# Patient Record
Sex: Female | Born: 1965
Health system: Southern US, Community
[De-identification: ages and names within clinical notes are randomized; demographics above are authoritative.]

## PROBLEM LIST (undated history)

## (undated) DIAGNOSIS — F419 Anxiety disorder, unspecified: Secondary | ICD-10-CM

## (undated) DIAGNOSIS — F329 Major depressive disorder, single episode, unspecified: Secondary | ICD-10-CM

## (undated) DIAGNOSIS — F32A Depression, unspecified: Secondary | ICD-10-CM

## (undated) DIAGNOSIS — S83209A Unspecified tear of unspecified meniscus, current injury, unspecified knee, initial encounter: Secondary | ICD-10-CM

## (undated) DIAGNOSIS — M179 Osteoarthritis of knee, unspecified: Secondary | ICD-10-CM

## (undated) DIAGNOSIS — K219 Gastro-esophageal reflux disease without esophagitis: Secondary | ICD-10-CM

## (undated) DIAGNOSIS — M1710 Unilateral primary osteoarthritis, unspecified knee: Secondary | ICD-10-CM

## (undated) DIAGNOSIS — E039 Hypothyroidism, unspecified: Secondary | ICD-10-CM

## (undated) DIAGNOSIS — M1990 Unspecified osteoarthritis, unspecified site: Secondary | ICD-10-CM

## (undated) HISTORY — DX: Gastro-esophageal reflux disease without esophagitis: K21.9

## (undated) HISTORY — DX: Osteoarthritis of knee, unspecified: M17.9

## (undated) HISTORY — DX: Depression, unspecified: F32.A

## (undated) HISTORY — PX: TONSILLECTOMY: SUR1361

## (undated) HISTORY — DX: Major depressive disorder, single episode, unspecified: F32.9

## (undated) HISTORY — DX: Unspecified tear of unspecified meniscus, current injury, unspecified knee, initial encounter: S83.209A

## (undated) HISTORY — DX: Unspecified osteoarthritis, unspecified site: M19.90

## (undated) HISTORY — DX: Anxiety disorder, unspecified: F41.9

## (undated) HISTORY — PX: CHOLECYSTECTOMY: SHX55

## (undated) HISTORY — DX: Unilateral primary osteoarthritis, unspecified knee: M17.10

## (undated) HISTORY — PX: FOOT SURGERY: SHX648

---

## 2001-08-27 ENCOUNTER — Emergency Department (HOSPITAL_COMMUNITY): Admission: EM | Admit: 2001-08-27 | Discharge: 2001-08-28 | Payer: Self-pay | Admitting: *Deleted

## 2005-11-16 ENCOUNTER — Emergency Department (HOSPITAL_COMMUNITY): Admission: EM | Admit: 2005-11-16 | Discharge: 2005-11-16 | Payer: Self-pay | Admitting: Family Medicine

## 2006-01-18 ENCOUNTER — Emergency Department (HOSPITAL_COMMUNITY): Admission: EM | Admit: 2006-01-18 | Discharge: 2006-01-18 | Payer: Self-pay | Admitting: Emergency Medicine

## 2010-01-17 ENCOUNTER — Emergency Department (HOSPITAL_COMMUNITY): Admission: EM | Admit: 2010-01-17 | Discharge: 2010-01-17 | Payer: Self-pay | Admitting: Emergency Medicine

## 2010-01-17 ENCOUNTER — Emergency Department (HOSPITAL_COMMUNITY): Admission: EM | Admit: 2010-01-17 | Discharge: 2010-01-18 | Payer: Self-pay | Admitting: Emergency Medicine

## 2010-07-16 LAB — URINALYSIS, ROUTINE W REFLEX MICROSCOPIC
Glucose, UA: NEGATIVE mg/dL
Ketones, ur: NEGATIVE mg/dL
Protein, ur: NEGATIVE mg/dL
Specific Gravity, Urine: 1.026 (ref 1.005–1.030)
Urobilinogen, UA: 0.2 mg/dL (ref 0.0–1.0)
Urobilinogen, UA: 0.2 mg/dL (ref 0.0–1.0)

## 2010-07-16 LAB — CBC
MCH: 30.4 pg (ref 26.0–34.0)
MCHC: 33.1 g/dL (ref 30.0–36.0)
MCV: 92 fL (ref 78.0–100.0)
Platelets: 227 10*3/uL (ref 150–400)
RDW: 13.9 % (ref 11.5–15.5)
WBC: 9.5 10*3/uL (ref 4.0–10.5)

## 2010-07-16 LAB — URINE MICROSCOPIC-ADD ON

## 2010-07-16 LAB — POCT URINALYSIS DIPSTICK
Protein, ur: NEGATIVE mg/dL
Specific Gravity, Urine: 1.025 (ref 1.005–1.030)
Urobilinogen, UA: 0.2 mg/dL (ref 0.0–1.0)

## 2010-07-16 LAB — DIFFERENTIAL
Basophils Absolute: 0 10*3/uL (ref 0.0–0.1)
Basophils Relative: 0 % (ref 0–1)
Eosinophils Absolute: 0.2 10*3/uL (ref 0.0–0.7)
Eosinophils Relative: 2 % (ref 0–5)
Lymphocytes Relative: 32 % (ref 12–46)

## 2010-07-16 LAB — URINE CULTURE
Colony Count: NO GROWTH
Culture  Setup Time: 201109181201

## 2010-07-16 LAB — POCT I-STAT, CHEM 8
Hemoglobin: 13.3 g/dL (ref 12.0–15.0)
Sodium: 140 mEq/L (ref 135–145)
TCO2: 27 mmol/L (ref 0–100)

## 2010-12-09 ENCOUNTER — Other Ambulatory Visit: Payer: Self-pay | Admitting: Obstetrics and Gynecology

## 2010-12-09 ENCOUNTER — Ambulatory Visit
Admission: RE | Admit: 2010-12-09 | Discharge: 2010-12-09 | Disposition: A | Discharge status: Home / Self Care | Payer: BC Managed Care – PPO | Source: Ambulatory Visit | Attending: Obstetrics and Gynecology | Admitting: Obstetrics and Gynecology

## 2010-12-09 DIAGNOSIS — Z1231 Encounter for screening mammogram for malignant neoplasm of breast: Secondary | ICD-10-CM

## 2012-08-09 ENCOUNTER — Telehealth: Payer: Self-pay | Admitting: Gynecology

## 2012-08-09 NOTE — Telephone Encounter (Signed)
Spoke with patient here in office. Patient states in the back of right calf of leg when touch feels a sensation. No redness or  Swelling noted . Patient states also in right knee feels weak at times. Patient states feels a tender area of throb sensation on mid upper back patient points to bra line area. Patient states has been on norethindroe since this past fall. Did attend a sporting event past Sunday where she had to sit in one position for about 10 hours. Denies having flown on airplane recently. Patient states is on Vitamin D, Dexalon for reflux, bupropion, synthroid. . Patient states her PCP is out of town  Till next week. Dr, Farrel Gobble aware of patient symptoms. Patient informed of Dr. Farrel Gobble instructions that she may be seen today to rule out a DVT or go to her PCP. Patient declines to be seen and have to pay a co-pay and turn around and pay another copay at another referring doctor. Patient states will continue to observe. Instructed patient to call me back if any other symptoms are noted or pain increases and will get her in to see a provider. sue

## 2012-08-09 NOTE — Telephone Encounter (Signed)
norethindroe/pt thinks rx may be causing leg pain side effect/very concerned/please advise/Wise

## 2012-09-27 ENCOUNTER — Other Ambulatory Visit: Payer: Self-pay | Admitting: *Deleted

## 2012-09-27 MED ORDER — BUPROPION HCL ER (XL) 300 MG PO TB24: 300.0000 mg | ORAL_TABLET | Freq: Every day | ORAL | Status: DC

## 2012-09-27 NOTE — Telephone Encounter (Signed)
yes

## 2012-09-27 NOTE — Telephone Encounter (Signed)
Patient due Aex in 8/14 however not scheduled ok to give #30 2rf?

## 2012-10-13 ENCOUNTER — Other Ambulatory Visit: Payer: Self-pay | Admitting: Family Medicine

## 2012-10-13 DIAGNOSIS — M25562 Pain in left knee: Secondary | ICD-10-CM

## 2012-10-27 ENCOUNTER — Ambulatory Visit
Admission: RE | Admit: 2012-10-27 | Discharge: 2012-10-27 | Disposition: A | Discharge status: Home / Self Care | Payer: BC Managed Care – PPO | Source: Ambulatory Visit | Attending: Family Medicine | Admitting: Family Medicine

## 2012-10-27 DIAGNOSIS — M25562 Pain in left knee: Secondary | ICD-10-CM

## 2012-11-29 ENCOUNTER — Ambulatory Visit: Payer: BC Managed Care – PPO | Attending: Family Medicine | Admitting: Physical Therapy

## 2012-11-29 ENCOUNTER — Encounter: Payer: Self-pay | Admitting: Certified Nurse Midwife

## 2012-11-29 ENCOUNTER — Ambulatory Visit (INDEPENDENT_AMBULATORY_CARE_PROVIDER_SITE_OTHER): Payer: BC Managed Care – PPO | Admitting: Certified Nurse Midwife

## 2012-11-29 VITALS — BP 112/62 | HR 64 | Resp 16 | Ht 63.0 in | Wt 352.0 lb

## 2012-11-29 DIAGNOSIS — E039 Hypothyroidism, unspecified: Secondary | ICD-10-CM

## 2012-11-29 DIAGNOSIS — Z01419 Encounter for gynecological examination (general) (routine) without abnormal findings: Secondary | ICD-10-CM

## 2012-11-29 DIAGNOSIS — M25569 Pain in unspecified knee: Secondary | ICD-10-CM | POA: Insufficient documentation

## 2012-11-29 DIAGNOSIS — R262 Difficulty in walking, not elsewhere classified: Secondary | ICD-10-CM | POA: Insufficient documentation

## 2012-11-29 DIAGNOSIS — F411 Generalized anxiety disorder: Secondary | ICD-10-CM

## 2012-11-29 DIAGNOSIS — IMO0001 Reserved for inherently not codable concepts without codable children: Secondary | ICD-10-CM | POA: Insufficient documentation

## 2012-11-29 DIAGNOSIS — M25669 Stiffness of unspecified knee, not elsewhere classified: Secondary | ICD-10-CM | POA: Insufficient documentation

## 2012-11-29 LAB — TSH: TSH: 1.64 u[IU]/mL (ref 0.350–4.500)

## 2012-11-29 MED ORDER — BUPROPION HCL ER (XL) 300 MG PO TB24: 300.0000 mg | ORAL_TABLET | Freq: Every day | ORAL | Status: DC

## 2012-11-29 NOTE — Progress Notes (Signed)
47 y.o.  Single Caucasian go po Fe here for annual exam. Periods regular with Aygestin use 10 days out of the month. Duration 5 days, not heavy, occasional cramping. Sees PCP yearly and prn for labs. Has left knee injury,torn meniscus, in physical therapy.  Thyroid medication seems to help with over all well being. No other health issues today. Wellbutrin working well for anxiety and depression. Desires continuance of medication.  Patient's last menstrual period was 11/10/2012.          Sexually active: no  The current method of family planning is abstinence.    Exercising: yes  pool exercises Smoker:  no  Health Maintenance: Pap:  11-17-11 neg HPV HR neg MMG:  12-09-10 normal Colonoscopy:  none BMD:   none TDaP:  2010 Labs: none Self breast exam: not done   reports that she has never smoked. She does not have any smokeless tobacco history on file. She reports that she drinks about 1.0 ounces of alcohol per week. She reports that she does not use illicit drugs.  Past Medical History  Diagnosis Date  . Acid reflux   . Anxiety   . Depression     Past Surgical History  Procedure Laterality Date  . Cholecystectomy    . Tonsillectomy    . Foot surgery      bilateral plantar fascitis    Current Outpatient Prescriptions  Medication Sig Dispense Refill  . buPROPion (WELLBUTRIN XL) 300 MG 24 hr tablet Take 1 tablet (300 mg total) by mouth daily.  30 tablet  2  . Dexlansoprazole (DEXILANT PO) Take by mouth daily.      . Diclofenac Sodium (PENNSAID TD) Place onto the skin 2 (two) times daily.      Marland Kitchen levothyroxine (SYNTHROID, LEVOTHROID) 75 MCG tablet Take 75 mcg by mouth daily before breakfast.      . norethindrone (AYGESTIN) 5 MG tablet Take 5 mg by mouth daily.       No current facility-administered medications for this visit.    Family History  Problem Relation Age of Onset  . Hypertension Brother   . Cancer Paternal Grandmother     colon    ROS:  Pertinent items are noted in  HPI.  Otherwise, a comprehensive ROS was negative.  Exam:   BP 112/62  Pulse 64  Resp 16  Ht 5\' 3"  (1.6 m)  Wt 352 lb (159.666 kg)  BMI 62.37 kg/m2  LMP 11/10/2012 Height: 5\' 3"  (160 cm)  Ht Readings from Last 3 Encounters:  11/29/12 5\' 3"  (1.6 m)    General appearance: alert, cooperative and appears stated age Head: Normocephalic, without obvious abnormality, atraumatic Neck: no adenopathy, supple, symmetrical, trachea midline and thyroid normal to inspection and palpation Lungs: clear to auscultation bilaterally Breasts: normal appearance, no masses or tenderness, No nipple retraction or dimpling, No nipple discharge or bleeding, No axillary or supraclavicular adenopathy Heart: regular rate and rhythm Abdomen: soft, non-tender; no masses,  no organomegaly Extremities: extremities normal, atraumatic, no cyanosis or edema Skin: Skin color, texture, turgor normal. No rashes or lesions Lymph nodes: Cervical, supraclavicular, and axillary nodes normal. No abnormal inguinal nodes palpated Neurologic: Grossly normal   Pelvic: External genitalia:  no lesions              Urethra:  normal appearing urethra with no masses, tenderness or lesions              Bartholin's and Skene's: normal  Vagina: normal appearing vagina with normal color and discharge, no lesions              Cervix: normal, non tender              Pap taken: no Bimanual Exam:  Uterus:  mid postion, difficult to palpate due to body habitus              Adnexa: no large masses palpated, difficult to palpate due to body habitus               Rectovaginal: Confirms               Anus:  normal sphincter tone, no lesions  A:  Well Woman with normal exam, limited pelvic exam due to body habitus  Contraception, not sexually active  History of irregular menses, Aygestin monthly working well  Thyroid stable on medication  Anxiety and depression Wellbutrin working well, desires continuance  Follow up needed  per note of left adnexal mass needed with PUS  Morbid obesity  Left knee injury under care with orthopedic.  P:   Reviewed health and wellness pertinent to exam  Condom use if needed  Continue to monitor periods with menses calendar and advise if no withdrawal bleeding, has RX per patient  Will check TSH lab today, will renew Synthroid based on results, patient not out of Rx  Rx Wellbutrin see order  Discussed follow up per chart note, patient not aware, will verify need and patient to be scheduled,patient agreeable.  Discussed importance for health and well being weight loss recommended. Patient not interested in changing at this point. Aware of surgical and weight loss options. Discussed unable to adequately assess pelvic exam due to weight, but follow up PUS  Should confirm exam.  Pap smear as per guidelines   Mammogram yearly stressed pap smear not taken today  counseled on breast self exam, mammography screening, adequate intake of calcium and vitamin D, diet and exercise, Kegel's exercises Rv for PUS return annually or prn  An After Visit Summary was printed and given to the patient.

## 2012-11-29 NOTE — Patient Instructions (Addendum)
EXERCISE AND DIET:  We recommended that you start or continue a regular exercise program for good health. Regular exercise means any activity that makes your heart beat faster and makes you sweat.  We recommend exercising at least 30 minutes per day at least 3 days a week, preferably 4 or 5.  We also recommend a diet low in fat and sugar.  Inactivity, poor dietary choices and obesity can cause diabetes, heart attack, stroke, and kidney damage, among others.    ALCOHOL AND SMOKING:  Women should limit their alcohol intake to no more than 7 drinks/beers/glasses of wine (combined, not each!) per week. Moderation of alcohol intake to this level decreases your risk of breast cancer and liver damage. And of course, no recreational drugs are part of a healthy lifestyle.  And absolutely no smoking or even second hand smoke. Most people know smoking can cause heart and lung diseases, but did you know it also contributes to weakening of your bones? Aging of your skin?  Yellowing of your teeth and nails?  CALCIUM AND VITAMIN D:  Adequate intake of calcium and Vitamin D are recommended.  The recommendations for exact amounts of these supplements seem to change often, but generally speaking 600 mg of calcium (either carbonate or citrate) and 800 units of Vitamin D per day seems prudent. Certain women may benefit from higher intake of Vitamin D.  If you are among these women, your doctor will have told you during your visit.    PAP SMEARS:  Pap smears, to check for cervical cancer or precancers,  have traditionally been done yearly, although recent scientific advances have shown that most women can have pap smears less often.  However, every woman still should have a physical exam from her gynecologist every year. It will include a breast check, inspection of the vulva and vagina to check for abnormal growths or skin changes, a visual exam of the cervix, and then an exam to evaluate the size and shape of the uterus and  ovaries.  And after 45 years of age, a rectal exam is indicated to check for rectal cancers. We will also provide age appropriate advice regarding health maintenance, like when you should have certain vaccines, screening for sexually transmitted diseases, bone density testing, colonoscopy, mammograms, etc.   MAMMOGRAMS:  All women over 40 years old should have a yearly mammogram. Many facilities now offer a "3D" mammogram, which may cost around $50 extra out of pocket. If possible,  we recommend you accept the option to have the 3D mammogram performed.  It both reduces the number of women who will be called back for extra views which then turn out to be normal, and it is better than the routine mammogram at detecting truly abnormal areas.    COLONOSCOPY:  Colonoscopy to screen for colon cancer is recommended for all women at age 50.  We know, you hate the idea of the prep.  We agree, BUT, having colon cancer and not knowing it is worse!!  Colon cancer so often starts as a polyp that can be seen and removed at colonscopy, which can quite literally save your life!  And if your first colonoscopy is normal and you have no family history of colon cancer, most women don't have to have it again for 10 years.  Once every ten years, you can do something that may end up saving your life, right?  We will be happy to help you get it scheduled when you are ready.    Be sure to check your insurance coverage so you understand how much it will cost.  It may be covered as a preventative service at no cost, but you should check your particular policy.    Obesity Obesity is having too much body fat and a body mass index (BMI) of 30 or more. BMI is a number based on your height and weight. The number is an estimate of how much body fat you have. Obesity can happen if you eat more calories than you can burn by exercising or other activity. It can cause major health problems or emergencies.  HOME CARE  Exercise and be active as  told by your doctor. Try:  Using stairs when you can.  Parking farther away from store doors.  Gardening, biking, or walking.  Eat healthy foods and drinks that are low in calories. Eat more fruits and vegetables.  Limit fast food, sweets, and snack foods that are made with ingredients that are not natural (processed food).  Eat smaller amounts of food.  Keep a journal and write down what you eat every day. Websites can help with this.  Avoid drinking alcohol. Drink more water and drinks without calories.   Take vitamins and dietary pills (supplements) only as told by your doctor.  Try going to weight-loss support groups or classes to help lessen stress. Dieticians and counselors may also help. GET HELP RIGHT AWAY IF:  You have chest pain or tightness.  You have trouble breathing or feel short of breath.  You feel weak or have loss of feeling (numbness) in your legs.  You feel confused or have trouble talking.  You have sudden changes in your vision. MAKE SURE YOU:  Understand these instructions.  Will watch your condition.  Will get help right away if you are not doing well or get worse. Document Released: 07/12/2011 Document Reviewed: 07/12/2011 [facility] Patient Information 2014 [place], [place].

## 2012-11-30 ENCOUNTER — Other Ambulatory Visit: Payer: Self-pay | Admitting: Certified Nurse Midwife

## 2012-11-30 DIAGNOSIS — N83209 Unspecified ovarian cyst, unspecified side: Secondary | ICD-10-CM

## 2012-11-30 NOTE — Progress Notes (Signed)
Note reviewed, agree with plan.  Earley Grobe, MD  

## 2012-12-01 ENCOUNTER — Telehealth: Payer: Self-pay

## 2012-12-01 NOTE — Telephone Encounter (Signed)
lmtcb

## 2012-12-01 NOTE — Telephone Encounter (Signed)
Message copied by Eliezer Bottom on Fri Dec 01, 2012 11:14 AM ------      Message from: Verner Chol      Created: Thu Nov 30, 2012 11:11 AM       Notify TSH normal continue same dose of medication ------

## 2012-12-04 NOTE — Telephone Encounter (Signed)
Patient notified see labs 

## 2012-12-05 ENCOUNTER — Ambulatory Visit: Payer: BC Managed Care – PPO | Attending: Family Medicine

## 2012-12-05 DIAGNOSIS — R262 Difficulty in walking, not elsewhere classified: Secondary | ICD-10-CM | POA: Insufficient documentation

## 2012-12-05 DIAGNOSIS — M25569 Pain in unspecified knee: Secondary | ICD-10-CM | POA: Insufficient documentation

## 2012-12-05 DIAGNOSIS — M25669 Stiffness of unspecified knee, not elsewhere classified: Secondary | ICD-10-CM | POA: Insufficient documentation

## 2012-12-05 DIAGNOSIS — IMO0001 Reserved for inherently not codable concepts without codable children: Secondary | ICD-10-CM | POA: Insufficient documentation

## 2012-12-09 ENCOUNTER — Other Ambulatory Visit: Payer: Self-pay | Admitting: Gynecology

## 2012-12-09 DIAGNOSIS — N83209 Unspecified ovarian cyst, unspecified side: Secondary | ICD-10-CM

## 2012-12-11 ENCOUNTER — Ambulatory Visit: Payer: BC Managed Care – PPO | Admitting: Physical Therapy

## 2012-12-15 ENCOUNTER — Ambulatory Visit: Payer: BC Managed Care – PPO | Admitting: Physical Therapy

## 2012-12-18 ENCOUNTER — Ambulatory Visit: Payer: BC Managed Care – PPO

## 2012-12-22 ENCOUNTER — Ambulatory Visit: Payer: BC Managed Care – PPO | Admitting: Physical Therapy

## 2012-12-25 ENCOUNTER — Encounter: Payer: Self-pay | Admitting: Certified Nurse Midwife

## 2012-12-26 ENCOUNTER — Other Ambulatory Visit: Payer: BC Managed Care – PPO

## 2012-12-26 ENCOUNTER — Ambulatory Visit (INDEPENDENT_AMBULATORY_CARE_PROVIDER_SITE_OTHER): Payer: BC Managed Care – PPO

## 2012-12-26 ENCOUNTER — Other Ambulatory Visit: Payer: BC Managed Care – PPO | Admitting: Gynecology

## 2012-12-26 ENCOUNTER — Ambulatory Visit: Payer: BC Managed Care – PPO | Admitting: Physical Therapy

## 2012-12-26 ENCOUNTER — Encounter: Payer: Self-pay | Admitting: Gynecology

## 2012-12-26 ENCOUNTER — Ambulatory Visit (INDEPENDENT_AMBULATORY_CARE_PROVIDER_SITE_OTHER): Payer: BC Managed Care – PPO | Admitting: Gynecology

## 2012-12-26 DIAGNOSIS — J069 Acute upper respiratory infection, unspecified: Secondary | ICD-10-CM

## 2012-12-26 DIAGNOSIS — N926 Irregular menstruation, unspecified: Secondary | ICD-10-CM

## 2012-12-26 DIAGNOSIS — N83209 Unspecified ovarian cyst, unspecified side: Secondary | ICD-10-CM

## 2012-12-26 NOTE — Patient Instructions (Addendum)
Hysteroscopy Hysteroscopy is a procedure used for looking inside the womb (uterus). It may be done for many different reasons, including:  To evaluate abnormal bleeding, fibroid (benign, noncancerous) tumors, polyps, scar tissue (adhesions), and possibly cancer of the uterus.  To look for lumps (tumors) and other uterine growths.  To look for causes of why a woman cannot get pregnant (infertility), causes of recurrent loss of pregnancy (miscarriages), or a lost intrauterine device (IUD).  To perform a sterilization by blocking the fallopian tubes from inside the uterus. A hysteroscopy should be done right after a menstrual period to be sure you are not pregnant. LET YOUR CAREGIVER KNOW ABOUT:   Allergies.  Medicines taken, including herbs, eyedrops, over-the-counter medicines, and creams.  Use of steroids (by mouth or creams).  Previous problems with anesthetics or numbing medicines.  History of bleeding or blood problems.  History of blood clots.  Possibility of pregnancy, if this applies.  Previous surgery.  Other health problems. RISKS AND COMPLICATIONS   Putting a hole in the uterus.  Excessive bleeding.  Infection.  Damage to the cervix.  Injury to other organs.  Allergic reaction to medicines.  Too much fluid used in the uterus for the procedure. BEFORE THE PROCEDURE   Do not take aspirin or blood thinners for a week before the procedure, or as directed. It can cause bleeding.  Arrive at least 60 minutes before the procedure or as directed to read and sign the necessary forms.  Arrange for someone to take you home after the procedure.  If you smoke, do not smoke for 2 weeks before the procedure. PROCEDURE   Your caregiver may give you medicine to relax you. He or she may also give you a medicine that numbs the area around the cervix (local anesthetic) or a medicine that makes you sleep (general anesthesia).  Sometimes, a medicine is placed in the cervix  the day before the procedure. This medicine makes the cervix have a larger opening (dilate). This makes it easier for the instrument to be inserted into the uterus.  A small instrument (hysteroscope) is inserted through the vagina into the uterus. This instrument is similar to a pencil-sized telescope with a light.  During the procedure, air or a liquid is put into the uterus, which allows the surgeon to see better.  Sometimes, tissue is gently scraped from inside the uterus. These tissue samples are sent to a specialist who looks at tissue samples (pathologist). The pathologist will give a report to your caregiver. This will help your caregiver decide if further treatment is necessary. The report will also help your caregiver decide on the best treatment if the test comes back abnormal. AFTER THE PROCEDURE   If you had a general anesthetic, you may be groggy for a couple hours after the procedure.  If you had a local anesthetic, you will be advised to rest at the surgical center or caregiver's office until you are stable and feel ready to go home.  You may have some cramping for a couple days.  You may have bleeding, which varies from light spotting for a few days to menstrual-like bleeding for up to 3 to 7 days. This is normal.  Have someone take you home. FINDING OUT THE RESULTS OF YOUR TEST Not all test results are available during your visit. If your test results are not back during the visit, make an appointment with your caregiver to find out the results. Do not assume everything is normal if you   have not heard from your caregiver or the medical facility. It is important for you to follow up on all of your test results. HOME CARE INSTRUCTIONS   Do not drive for 24 hours or as instructed.  Only take over-the-counter or prescription medicines for pain, discomfort, or fever as directed by your caregiver.  Do not take aspirin. It can cause or aggravate bleeding.  Do not drive or drink  alcohol while taking pain medicine.  You may resume your usual diet.  Do not use tampons, douche, or have sexual intercourse for 2 weeks, or as advised by your caregiver.  Rest and sleep for the first 24 to 48 hours.  Take your temperature twice a day for 4 to 5 days. Write it down. Give these temperatures to your caregiver if they are abnormal (above 98.6 F or 37.0 C).  Take medicines your caregiver has ordered as directed.  Follow your caregiver's advice regarding diet, exercise, lifting, driving, and general activities.  Take showers instead of baths for 2 weeks, or as recommended by your caregiver.  If you develop constipation:  Take a mild laxative with the advice of your caregiver.  Eat bran foods.  Drink enough water and fluids to keep your urine clear or pale yellow.  Try to have someone with you or available to you for the first 24 to 48 hours, especially if you had a general anesthetic.  Make sure you and your family understand everything about your operation and recovery.  Follow your caregiver's advice regarding follow-up appointments and Pap smears. SEEK MEDICAL CARE IF:   You feel dizzy or lightheaded.  You feel sick to your stomach (nauseous).  You develop abnormal vaginal discharge.  You develop a rash.  You have an abnormal reaction or allergy to your medicine.  You need stronger pain medicine. SEEK IMMEDIATE MEDICAL CARE IF:   Bleeding is heavier than a normal menstrual period or you have blood clots.  You have an oral temperature above 102 F (38.9 C), not controlled by medicine.  You have increasing cramps or pains not relieved with medicine.  You develop belly (abdominal) pain that does not seem to be related to the same area of earlier cramping and pain.  You pass out.  You develop pain in the tops of your shoulders (shoulder strap areas).  You develop shortness of breath. MAKE SURE YOU:   Understand these instructions.  Will watch  your condition.  Will get help right away if you are not doing well or get worse. Document Released: 07/26/2000 Document Revised: 07/12/2011 Document Reviewed: 11/18/2008 [facility] Patient Information 2014 [place], [place].   Levonorgestrel intrauterine device (IUD) What is this medicine? LEVONORGESTREL IUD (LEE voe nor jes trel) is a contraceptive (birth control) device. The device is placed inside the uterus by a healthcare professional. It is used to prevent pregnancy and can also be used to treat heavy bleeding that occurs during your period. Depending on the device, it can be used for 3 to 5 years. This medicine may be used for other purposes; ask your health care provider or pharmacist if you have questions. What should I tell my health care provider before I take this medicine? They need to know if you have any of these conditions: -abnormal Pap smear -cancer of the breast, uterus, or cervix -diabetes -endometritis -genital or pelvic infection now or in the past -have more than one sexual partner or your partner has more than one partner -heart disease -history of an ectopic  or tubal pregnancy -immune system problems -IUD in place -liver disease or tumor -problems with blood clots or take blood-thinners -use intravenous drugs -uterus of unusual shape -vaginal bleeding that has not been explained -an unusual or allergic reaction to levonorgestrel, other hormones, silicone, or polyethylene, medicines, foods, dyes, or preservatives -pregnant or trying to get pregnant -breast-feeding How should I use this medicine? This device is placed inside the uterus by a health care professional. Talk to your pediatrician regarding the use of this medicine in children. Special care may be needed. Overdosage: If you think you have taken too much of this medicine contact a poison control center or emergency room at once. NOTE: This medicine is only for you. Do not share this medicine with  others. What if I miss a dose? This does not apply. What may interact with this medicine? Do not take this medicine with any of the following medications: -amprenavir -bosentan -fosamprenavir This medicine may also interact with the following medications: -aprepitant -barbiturate medicines for inducing sleep or treating seizures -bexarotene -griseofulvin -medicines to treat seizures like carbamazepine, ethotoin, felbamate, oxcarbazepine, phenytoin, topiramate -modafinil -pioglitazone -rifabutin -rifampin -rifapentine -some medicines to treat HIV infection like atazanavir, indinavir, lopinavir, nelfinavir, tipranavir, ritonavir -St. John's wort -warfarin This list may not describe all possible interactions. Give your health care provider a list of all the medicines, herbs, non-prescription drugs, or dietary supplements you use. Also tell them if you smoke, drink alcohol, or use illegal drugs. Some items may interact with your medicine. What should I watch for while using this medicine? Visit your doctor or health care professional for regular check ups. See your doctor if you or your partner has sexual contact with others, becomes HIV positive, or gets a sexual transmitted disease. This product does not protect you against HIV infection (AIDS) or other sexually transmitted diseases. You can check the placement of the IUD yourself by reaching up to the top of your vagina with clean fingers to feel the threads. Do not pull on the threads. It is a good habit to check placement after each menstrual period. Call your doctor right away if you feel more of the IUD than just the threads or if you cannot feel the threads at all. The IUD may come out by itself. You may become pregnant if the device comes out. If you notice that the IUD has come out use a backup birth control method like condoms and call your health care provider. Using tampons will not change the position of the IUD and are okay to  use during your period. What side effects may I notice from receiving this medicine? Side effects that you should report to your doctor or health care professional as soon as possible: -allergic reactions like skin rash, itching or hives, swelling of the face, lips, or tongue -fever, flu-like symptoms -genital sores -high blood pressure -no menstrual period for 6 weeks during use -pain, swelling, warmth in the leg -pelvic pain or tenderness -severe or sudden headache -signs of pregnancy -stomach cramping -sudden shortness of breath -trouble with balance, talking, or walking -unusual vaginal bleeding, discharge -yellowing of the eyes or skin Side effects that usually do not require medical attention (report to your doctor or health care professional if they continue or are bothersome): -acne -breast pain -change in sex drive or performance -changes in weight -cramping, dizziness, or faintness while the device is being inserted -headache -irregular menstrual bleeding within first 3 to 6 months of use -nausea This list may  not describe all possible side effects. Call your doctor for medical advice about side effects. You may report side effects to FDA at 1-800-FDA-1088. Where should I keep my medicine? This does not apply. NOTE: This sheet is a summary. It may not cover all possible information. If you have questions about this medicine, talk to your doctor, pharmacist, or health care provider.  2013, [employer]. (05/20/2011 1:54:04 PM)

## 2012-12-26 NOTE — Progress Notes (Signed)
      Here ofr u/s for morbid obesity. Pt is on Aygestin for treatment of menorrhagia and to decrease the effects of obesity.   U/s images reviewed with pt.  Pt noted to have a cervical polyp with feeder vessel seen.  Pt with 2 simple cysts aslo noted. We reviewed the images from today and compared with u/s done in 2013.  Cervical mass not seen on prior films. We discussed either LUS prolasping polyp or cervical in nature Pt reports that she has been doing very well on aygestin d1-10/month until last month.  She generally bleeds for 4d, moderate and very predictable.  Pt reports having a cycle at the end of July and again 8/6.  Both normal flow.  Pt is not sexually active.   We discussed that the bleeding could be related to either this polyp or she may have ovulated as she is not menopausal and was having erratic menses prior to aygestin therapy.  Suggest removal of polyp via D&C hysteroscopy after her next projected flow.   Briefly discussed procedure.  Information given. I suggest that afterwards we should watch cycles for improvement and may consider Mirena IUD placement or implanon, discussed impact of weight on contraceptive choices. Questions addressed

## 2013-01-02 ENCOUNTER — Encounter: Payer: BC Managed Care – PPO | Admitting: Physical Therapy

## 2013-01-03 ENCOUNTER — Telehealth: Payer: Self-pay | Admitting: Gynecology

## 2013-01-03 NOTE — Telephone Encounter (Signed)
LMTCB to discuss ins benefits for surgery. °

## 2013-01-05 ENCOUNTER — Encounter: Payer: BC Managed Care – PPO | Admitting: Physical Therapy

## 2013-01-11 ENCOUNTER — Encounter (HOSPITAL_COMMUNITY): Payer: Self-pay

## 2013-01-16 ENCOUNTER — Telehealth: Payer: Self-pay | Admitting: Gynecology

## 2013-01-16 NOTE — Telephone Encounter (Signed)
Call to patient to remind her that payment is due tomorrow for surgery. LMTCB

## 2013-01-18 ENCOUNTER — Telehealth: Payer: Self-pay | Admitting: *Deleted

## 2013-01-18 NOTE — Telephone Encounter (Signed)
Surgery date of 01-31-13 reviewed with patient. States LMP ended approx 01-15-13 and states not sexually active.  Surgery instructions reviewed and pre/post op appts made. Copy of instructions left on chart for appt tomm.

## 2013-01-19 ENCOUNTER — Ambulatory Visit (INDEPENDENT_AMBULATORY_CARE_PROVIDER_SITE_OTHER): Payer: BC Managed Care – PPO | Admitting: Gynecology

## 2013-01-19 ENCOUNTER — Encounter: Payer: Self-pay | Admitting: Gynecology

## 2013-01-19 ENCOUNTER — Encounter (HOSPITAL_COMMUNITY): Payer: Self-pay | Admitting: Pharmacy Technician

## 2013-01-19 VITALS — BP 112/72 | HR 72 | Resp 14 | Ht 63.0 in | Wt 343.0 lb

## 2013-01-19 DIAGNOSIS — N841 Polyp of cervix uteri: Secondary | ICD-10-CM

## 2013-01-19 MED ORDER — MISOPROSTOL 200 MCG PO TABS: ORAL_TABLET | ORAL | Status: DC

## 2013-01-19 NOTE — Progress Notes (Signed)
  48 y.o.SingleNot Hispanic or Latinofemale presents for preoperative consult for D&C hysteroscopywith and withoutresectoscope forabnormal uterine bleeding and cervical polyp.  Pt is using aygestin for the first 10d of the month and reports that now her bleeding is scant, no hot flashes.  Pt was noted to have a polyp with feeder vessel in her cervix on u/s and will now have it removed.  Polyp 11x16mm.  U/s images reviewed with pt again today   Pertinent items are noted in HPI.  BP 112/72  Pulse 72  Resp 14  Ht 5\' 3"  (1.6 m)  Wt 343 lb (155.584 kg)  BMI 60.78 kg/m2  LMP 01/12/2013 General appearance: alert, cooperative and appears stated age Lungs: clear to auscultation bilaterally Heart: regular rate and rhythm, S1, S2 normal, no murmur, click, rub or gallop Abdomen: soft, non-tender; bowel sounds normal; no masses,  no organomegaly Pelvic: cervix normal in appearance, external genitalia normal, no adnexal masses or tenderness, no cervical motion tenderness, uterus normal size, shape, and consistency and vagina normal without discharge Extremities: extremities normal, atraumatic, no cyanosis or edema Lymph nodes: Cervical, supraclavicular, and axillary nodes normal.  The procedure was discussed at length, the ACOG handoutwas given to the patientprior to the consult.  Pt informed regarding risks and benefits of surgery, including but not limited to bleeding, infections, damage to bowel or bladder due to uterine perforation either during the procedure or during dilation.  A medicationwill be be given preoperatively to soften the cervix.    There is a risk of formation of a deep vein thrombus in the extremities was discussed, although PAS will be placed to minimize the risk, the patient was informed that a pulmonary embolism could still form and could result in death.  She was instructed on signs and symptoms to be aware of and the need to call if they should develop.  Fluid overload from the  distending media was discussed as was the usual intra-operative safety precautions in place.  All questions were addressed.  Post-operative medications were not given to the patient.

## 2013-01-20 ENCOUNTER — Other Ambulatory Visit: Payer: Self-pay | Admitting: Gynecology

## 2013-01-22 NOTE — Telephone Encounter (Signed)
Patient was seen 11/29/12 for AEX no rx was given last TSH level was checked at [facility] it was 1.640. According to result note patient is to continue same dose.  Please advise.

## 2013-01-23 ENCOUNTER — Telehealth: Payer: Self-pay | Admitting: Gynecology

## 2013-01-23 ENCOUNTER — Encounter: Payer: Self-pay | Admitting: Gynecology

## 2013-01-23 ENCOUNTER — Ambulatory Visit (INDEPENDENT_AMBULATORY_CARE_PROVIDER_SITE_OTHER): Payer: BC Managed Care – PPO | Admitting: Gynecology

## 2013-01-23 VITALS — BP 104/62 | HR 76 | Resp 18 | Ht 63.0 in | Wt 353.0 lb

## 2013-01-23 DIAGNOSIS — R300 Dysuria: Secondary | ICD-10-CM

## 2013-01-23 LAB — POCT URINALYSIS DIPSTICK
Blood, UA: 2
Urobilinogen, UA: NEGATIVE
pH, UA: 5

## 2013-01-23 MED ORDER — CIPROFLOXACIN HCL 500 MG PO TABS: 500.0000 mg | ORAL_TABLET | Freq: Two times a day (BID) | ORAL | Status: DC

## 2013-01-23 MED ORDER — PHENAZOPYRIDINE HCL 200 MG PO TABS: 200.0000 mg | ORAL_TABLET | Freq: Three times a day (TID) | ORAL | Status: DC | PRN

## 2013-01-23 NOTE — Telephone Encounter (Signed)
Pt  is having a procedure on 01/31/13 and has some questions she thinks she may have a uti

## 2013-01-23 NOTE — Progress Notes (Signed)
Subjective:     Patient ID: Dominique Mcpherson, female   DOB: 1966/03/11, 47 y.o.   MRN: [id number]  HPI   Review of Systems     Objective:   Physical Exam     Assessment:         Plan:           Subjective:    Dominique MCDOUGALD is a 47 y.o. female who complains of frequency, suprapubic pressure and urgency. She has had symptoms for 5 days. Patient also complains of none. Patient denies back pain and fever. Patient does not have a history of recurrent UTI. Patient does not have a history of pyelonephritis.   The following portions of the patient's history were reviewed and updated as appropriate: allergies, current medications, past family history, past medical history, past social history, past surgical history and problem list.  Review of Systems Pertinent items are noted in HPI.    Objective:    BP 104/62  Pulse 76  Resp 18  Ht 5\' 3"  (1.6 m)  Wt 353 lb (160.12 kg)  BMI 62.55 kg/m2  LMP 01/12/2013 General appearance: alert, cooperative and appears stated age Back: symmetric, no curvature. ROM normal. No CVA tenderness. Abdomen: suprapubic tenderness  Laboratory:  Urine dipstick: 2+ for leukocyte esterase, negative for nitrites, trace for protein and pH5, cloudy.   Micro exam: not done.    Assessment:    Acute cystitis and Genital irritation     Plan:    Medications: ciprofloxacin. Maintain adequate hydration.

## 2013-01-23 NOTE — Telephone Encounter (Signed)
Spoke with patient and appointment booked for today with Dr Farrel Gobble.

## 2013-01-23 NOTE — Telephone Encounter (Signed)
Pt is schedule for a procedure on 01/31/13 and thinks she may have a uti pt would like to know if she needs to see Dr Farrel Gobble

## 2013-01-23 NOTE — Patient Instructions (Signed)
Place urinary tract infection patient instructions here.

## 2013-01-26 LAB — URINE CULTURE

## 2013-01-31 ENCOUNTER — Encounter (HOSPITAL_COMMUNITY): Payer: Self-pay | Admitting: Anesthesiology

## 2013-01-31 ENCOUNTER — Encounter (HOSPITAL_COMMUNITY)
Admission: RE | Disposition: A | Discharge status: Home / Self Care | Payer: Self-pay | Source: Ambulatory Visit | Attending: Gynecology

## 2013-01-31 ENCOUNTER — Ambulatory Visit (HOSPITAL_COMMUNITY): Payer: BC Managed Care – PPO | Admitting: Anesthesiology

## 2013-01-31 ENCOUNTER — Ambulatory Visit (HOSPITAL_COMMUNITY)
Admission: RE | Admit: 2013-01-31 | Discharge: 2013-01-31 | Disposition: A | Discharge status: Home / Self Care | Payer: BC Managed Care – PPO | Source: Ambulatory Visit | Attending: Gynecology | Admitting: Gynecology

## 2013-01-31 DIAGNOSIS — N841 Polyp of cervix uteri: Secondary | ICD-10-CM

## 2013-01-31 DIAGNOSIS — Z9889 Other specified postprocedural states: Secondary | ICD-10-CM

## 2013-01-31 HISTORY — DX: Hypothyroidism, unspecified: E03.9

## 2013-01-31 HISTORY — PX: HYSTEROSCOPY WITH D & C: SHX1775

## 2013-01-31 LAB — CBC
HCT: 34 % — ABNORMAL LOW (ref 36.0–46.0)
Hemoglobin: 11.2 g/dL — ABNORMAL LOW (ref 12.0–15.0)
MCHC: 32.9 g/dL (ref 30.0–36.0)
Platelets: 230 10*3/uL (ref 150–400)
RBC: 3.8 MIL/uL — ABNORMAL LOW (ref 3.87–5.11)
RDW: 14 % (ref 11.5–15.5)
WBC: 6.1 10*3/uL (ref 4.0–10.5)

## 2013-01-31 LAB — PREGNANCY, URINE: Preg Test, Ur: NEGATIVE

## 2013-01-31 SURGERY — DILATATION AND CURETTAGE /HYSTEROSCOPY
Anesthesia: General | Site: Uterus | Wound class: Clean Contaminated

## 2013-01-31 MED ORDER — MIDAZOLAM HCL 2 MG/2ML IJ SOLN
INTRAMUSCULAR | Status: AC
  Filled 2013-01-31: qty 4

## 2013-01-31 MED ORDER — SODIUM CHLORIDE 0.9 % IR SOLN
Status: DC | PRN
  Administered 2013-01-31: 3000 mL

## 2013-01-31 MED ORDER — DEXAMETHASONE SODIUM PHOSPHATE 10 MG/ML IJ SOLN
INTRAMUSCULAR | Status: DC | PRN
  Administered 2013-01-31: 10 mg via INTRAVENOUS

## 2013-01-31 MED ORDER — PROPOFOL 10 MG/ML IV EMUL
INTRAVENOUS | Status: AC
  Filled 2013-01-31: qty 40

## 2013-01-31 MED ORDER — FENTANYL CITRATE 0.05 MG/ML IJ SOLN
INTRAMUSCULAR | Status: DC | PRN
  Administered 2013-01-31 (×2): 25 ug via INTRAVENOUS
  Administered 2013-01-31 (×3): 50 ug via INTRAVENOUS

## 2013-01-31 MED ORDER — ONDANSETRON HCL 4 MG/2ML IJ SOLN
INTRAMUSCULAR | Status: AC
  Filled 2013-01-31: qty 2

## 2013-01-31 MED ORDER — BUPIVACAINE-EPINEPHRINE 0.25% -1:200000 IJ SOLN
INTRAMUSCULAR | Status: DC | PRN
  Administered 2013-01-31: 15 mL

## 2013-01-31 MED ORDER — DEXAMETHASONE SODIUM PHOSPHATE 10 MG/ML IJ SOLN
INTRAMUSCULAR | Status: AC
  Filled 2013-01-31: qty 1

## 2013-01-31 MED ORDER — FENTANYL CITRATE 0.05 MG/ML IJ SOLN
INTRAMUSCULAR | Status: AC
  Filled 2013-01-31: qty 6

## 2013-01-31 MED ORDER — BUPIVACAINE-EPINEPHRINE PF 0.25-1:200000 % IJ SOLN
INTRAMUSCULAR | Status: AC
  Filled 2013-01-31: qty 30

## 2013-01-31 MED ORDER — LIDOCAINE HCL (CARDIAC) 20 MG/ML IV SOLN
INTRAVENOUS | Status: DC | PRN
  Administered 2013-01-31: 60 mg via INTRAVENOUS

## 2013-01-31 MED ORDER — KETOROLAC TROMETHAMINE 30 MG/ML IJ SOLN
INTRAMUSCULAR | Status: AC
  Filled 2013-01-31: qty 1

## 2013-01-31 MED ORDER — ONDANSETRON HCL 4 MG/2ML IJ SOLN
INTRAMUSCULAR | Status: DC | PRN
  Administered 2013-01-31: 4 mg via INTRAVENOUS

## 2013-01-31 MED ORDER — PROPOFOL 10 MG/ML IV BOLUS
INTRAVENOUS | Status: DC | PRN
  Administered 2013-01-31: 280 mg via INTRAVENOUS

## 2013-01-31 MED ORDER — KETOROLAC TROMETHAMINE 30 MG/ML IJ SOLN
INTRAMUSCULAR | Status: DC | PRN
  Administered 2013-01-31: 30 mg via INTRAVENOUS

## 2013-01-31 MED ORDER — MIDAZOLAM HCL 2 MG/2ML IJ SOLN
INTRAMUSCULAR | Status: DC | PRN
  Administered 2013-01-31: 2 mg via INTRAVENOUS

## 2013-01-31 MED ORDER — LACTATED RINGERS IV SOLN
INTRAVENOUS | Status: DC
  Administered 2013-01-31 (×2): via INTRAVENOUS

## 2013-01-31 MED ORDER — VASOPRESSIN 20 UNIT/ML IJ SOLN
INTRAMUSCULAR | Status: AC
  Filled 2013-01-31: qty 1

## 2013-01-31 MED ORDER — LIDOCAINE HCL (CARDIAC) 20 MG/ML IV SOLN
INTRAVENOUS | Status: AC
  Filled 2013-01-31: qty 5

## 2013-01-31 SURGICAL SUPPLY — 18 items
BLADE INCISOR TRUC PLUS 2.9 (ABLATOR) IMPLANT
CATH ROBINSON RED A/P 16FR (CATHETERS) ×2 IMPLANT
CONTAINER PREFILL 10% NBF 60ML (FORM) ×4 IMPLANT
DRESSING TELFA 8X3 (GAUZE/BANDAGES/DRESSINGS) ×2 IMPLANT
ELECT REM PT RETURN 9FT ADLT (ELECTROSURGICAL)
ELECTRODE REM PT RTRN 9FT ADLT (ELECTROSURGICAL) IMPLANT
GLOVE BIOGEL M 6.5 STRL (GLOVE) ×2 IMPLANT
GLOVE BIOGEL PI IND STRL 6.5 (GLOVE) ×1 IMPLANT
GLOVE BIOGEL PI INDICATOR 6.5 (GLOVE) ×1
GOWN STRL REIN XL XLG (GOWN DISPOSABLE) ×4 IMPLANT
INCISOR TRUC PLUS BLADE 2.9 (ABLATOR) ×2
LOOP ANGLED CUTTING 22FR (CUTTING LOOP) IMPLANT
NDL HYPO 21X1.5 SAFETY (NEEDLE) IMPLANT
NEEDLE HYPO 21X1.5 SAFETY (NEEDLE) IMPLANT
PACK HYSTEROSCOPY LF (CUSTOM PROCEDURE TRAY) ×2 IMPLANT
PAD OB MATERNITY 4.3X12.25 (PERSONAL CARE ITEMS) ×2 IMPLANT
TOWEL OR 17X24 6PK STRL BLUE (TOWEL DISPOSABLE) ×4 IMPLANT
WATER STERILE IRR 1000ML POUR (IV SOLUTION) ×2 IMPLANT

## 2013-01-31 NOTE — Anesthesia Postprocedure Evaluation (Signed)
Anesthesia Post Note  Patient: Dominique Mcpherson  Procedure(s) Performed: Procedure(s) (LRB): DILATATION AND CURETTAGE  TRU CLEAR /HYSTEROSCOPY (N/A)  Anesthesia type: General  Patient location: PACU  Post pain: Pain level controlled  Post assessment: Post-op Vital signs reviewed  Last Vitals:  Filed Vitals:   01/31/13 1315  BP: 148/76  Pulse: 82  Temp:   Resp: 18    Post vital signs: Reviewed  Level of consciousness: sedated  Complications: No apparent anesthesia complications

## 2013-01-31 NOTE — Interval H&P Note (Signed)
History and Physical Interval Note:  01/31/2013 11:42 AM  Dominique Mcpherson  has presented today for surgery, with the diagnosis of cervical polyp  The various methods of treatment have been discussed with the patient and family. After consideration of risks, benefits and other options for treatment, the patient has consented to  Procedure(s): DILATATION AND CURETTAGE /HYSTEROSCOPY (N/A) as a surgical intervention .  The patient's history has been reviewed, patient examined, no change in status, stable for surgery.  I have reviewed the patient's chart and labs.  Questions were answered to the patient's satisfaction.     Wyoma Genson H

## 2013-01-31 NOTE — H&P (View-Only) (Signed)
  47 y.o.SingleNot Hispanic or Latinofemale presents for preoperative consult for D&C hysteroscopywith and withoutresectoscope forabnormal uterine bleeding and cervical polyp.  Pt is using aygestin for the first 10d of the month and reports that now her bleeding is scant, no hot flashes.  Pt was noted to have a polyp with feeder vessel in her cervix on u/s and will now have it removed.  Polyp 11x11mm.  U/s images reviewed with pt again today   Pertinent items are noted in HPI.  BP 112/72  Pulse 72  Resp 14  Ht 5\' 3"  (1.6 m)  Wt 343 lb (155.584 kg)  BMI 60.78 kg/m2  LMP 01/12/2013 General appearance: alert, cooperative and appears stated age Lungs: clear to auscultation bilaterally Heart: regular rate and rhythm, S1, S2 normal, no murmur, click, rub or gallop Abdomen: soft, non-tender; bowel sounds normal; no masses,  no organomegaly Pelvic: cervix normal in appearance, external genitalia normal, no adnexal masses or tenderness, no cervical motion tenderness, uterus normal size, shape, and consistency and vagina normal without discharge Extremities: extremities normal, atraumatic, no cyanosis or edema Lymph nodes: Cervical, supraclavicular, and axillary nodes normal.  The procedure was discussed at length, the ACOG handoutwas given to the patientprior to the consult.  Pt informed regarding risks and benefits of surgery, including but not limited to bleeding, infections, damage to bowel or bladder due to uterine perforation either during the procedure or during dilation.  A medicationwill be be given preoperatively to soften the cervix.    There is a risk of formation of a deep vein thrombus in the extremities was discussed, although PAS will be placed to minimize the risk, the patient was informed that a pulmonary embolism could still form and could result in death.  She was instructed on signs and symptoms to be aware of and the need to call if they should develop.  Fluid overload from the  distending media was discussed as was the usual intra-operative safety precautions in place.  All questions were addressed.  Post-operative medications were not given to the patient.

## 2013-01-31 NOTE — Transfer of Care (Signed)
Immediate Anesthesia Transfer of Care Note  Patient: Dominique Mcpherson  Procedure(s) Performed: Procedure(s): DILATATION AND CURETTAGE  TRU CLEAR /HYSTEROSCOPY (N/A)  Patient Location: PACU  Anesthesia Type:General  Level of Consciousness: sedated  Airway & Oxygen Therapy: Patient Spontanous Breathing and Patient connected to nasal cannula oxygen  Post-op Assessment: Report given to PACU RN and Post -op Vital signs reviewed and stable  Post vital signs: stable  Complications: No apparent anesthesia complications

## 2013-01-31 NOTE — Anesthesia Preprocedure Evaluation (Signed)
Anesthesia Evaluation  Patient identified by MRN, date of birth, ID band Patient awake    Reviewed: Allergy & Precautions, H&P , NPO status , Patient's Chart, lab work & pertinent test results  Airway Mallampati: III TM Distance: >3 FB Neck ROM: Full    Dental no notable dental hx. (+) Teeth Intact   Pulmonary neg pulmonary ROS,  breath sounds clear to auscultation  Pulmonary exam normal       Cardiovascular negative cardio ROS  Rhythm:Regular Rate:Normal     Neuro/Psych PSYCHIATRIC DISORDERS Anxiety Depression negative neurological ROS     GI/Hepatic GERD-  Medicated and Controlled,  Endo/Other  Hypothyroidism Morbid obesity  Renal/GU   negative genitourinary   Musculoskeletal  (+) Arthritis -, Osteoarthritis,    Abdominal (+) + obese,   Peds  Hematology   Anesthesia Other Findings   Reproductive/Obstetrics Endometrial polyp                           Anesthesia Physical Anesthesia Plan  ASA: III  Anesthesia Plan: General   Post-op Pain Management:    Induction: Intravenous  Airway Management Planned: LMA  Additional Equipment:   Intra-op Plan:   Post-operative Plan: Extubation in OR  Informed Consent: I have reviewed the patients History and Physical, chart, labs and discussed the procedure including the risks, benefits and alternatives for the proposed anesthesia with the patient or authorized representative who has indicated his/her understanding and acceptance.   Dental advisory given  Plan Discussed with: CRNA, Anesthesiologist and Surgeon  Anesthesia Plan Comments:         Anesthesia Quick Evaluation

## 2013-02-01 ENCOUNTER — Encounter (HOSPITAL_COMMUNITY): Payer: Self-pay | Admitting: Gynecology

## 2013-02-16 ENCOUNTER — Encounter: Payer: Self-pay | Admitting: Gynecology

## 2013-02-16 ENCOUNTER — Ambulatory Visit (INDEPENDENT_AMBULATORY_CARE_PROVIDER_SITE_OTHER): Payer: BC Managed Care – PPO | Admitting: Gynecology

## 2013-02-16 VITALS — BP 120/74 | HR 80 | Resp 16 | Ht 63.0 in | Wt 353.0 lb

## 2013-02-16 DIAGNOSIS — N841 Polyp of cervix uteri: Secondary | ICD-10-CM

## 2013-02-16 NOTE — Patient Instructions (Signed)
Call if cycles become irregular or heavy

## 2013-02-16 NOTE — Progress Notes (Signed)
Subjective:     Dominique Mcpherson is a 47 y.o. female who presents to the clinic 2 weeks status post operative hysteroscopy for cervical polyp and irreg menses. Eating a regular diet without difficulty. Bowel movements are normal. The patient is not having any pain. pt reports menses started on time 10/11 but has lasted a little longer but lighter flow now day 6 usually 4d  The following portions of the patient's history were reviewed and updated as appropriate: allergies, current medications, past family history, past medical history, past social history, past surgical history and problem list.  Review of Systems Pertinent items are noted in HPI.    Objective:    BP 120/74  Pulse 80  Resp 16  Ht 5\' 3"  (1.6 m)  Wt 353 lb (160.12 kg)  BMI 62.55 kg/m2  LMP 02/10/2013 General:  alert, cooperative and appears stated age  Abdomen: soft, non-tender, obese        Pelvic exam: normal external genitalia, vulva, vagina, cervix, uterus and adnexa.light menstrum in vault no malodor, no CMT, uterus limited by habitus but nontender  Assessment:    Doing well postoperatively. Operative findings again reviewed. Pathology report discussed.    Plan:    1. Continue any current medications. 2. Wound care discussed. 3. Activity restrictions: none Pt to call with follow up cycles

## 2013-03-01 NOTE — Brief Op Note (Signed)
01/31/2013  9:10 AM  PATIENT:  Dominique Mcpherson  47 y.o. female  PRE-OPERATIVE DIAGNOSIS:  cervical polyp  POST-OPERATIVE DIAGNOSIS:  cervical polyp  PROCEDURE:  Procedure(s): DILATATION AND CURETTAGE  TRU CLEAR /HYSTEROSCOPY (N/A)  SURGEON:  Surgeon(s) and Role:    * Bennye Alm, MD - Primary  PHYSICIAN ASSISTANT:   ASSISTANTS: none   ANESTHESIA:   paracervical block and MAC  EBL:     BLOOD ADMINISTERED:none  DRAINS: none   LOCAL MEDICATIONS USED:  MARCAINE   , LIDOCAINE  and Amount: 15 ml  SPECIMEN:  Source of Specimen:  cervix  DISPOSITION OF SPECIMEN:  PATHOLOGY  COUNTS:  YES  TOURNIQUET:  * No tourniquets in log *  DICTATION: .Other Dictation: Dictation Number [id number]  PLAN OF CARE: Discharge to home after PACU  PATIENT DISPOSITION:  PACU - hemodynamically stable.   Delay start of Pharmacological VTE agent (>24hrs) due to surgical blood loss or risk of bleeding: not applicable

## 2013-03-02 NOTE — Op Note (Deleted)
[name], [name] NO.:  [id number]  MEDICAL RECORD NO.:  [id number]  LOCATION:                                 FACILITY:  PHYSICIAN:  Ivor Costa. Farrel Gobble, M.D. DATE OF BIRTH:  [birth year]  DATE OF PROCEDURE:  01/31/2013 DATE OF DISCHARGE:  01/31/2013                              OPERATIVE REPORT   PREOPERATIVE DIAGNOSIS:  Cervical polyp versus prolapsing fibroid.  POSTOPERATIVE DIAGNOSIS:  Cervical polyp.  PROCEDURE:  D and C, hysteroscopy with TRUCLEAR hysteroscope.  SURGEON:  Ivor Costa. Farrel Gobble, M.D.  ASSISTANT:  None.  ANESTHESIA:  MAC with paracervical block.  ESTIMATED BLOOD LOSS:  Minimal.  INDICATIONS:  The patient is a 47 year old morbidly obese patient, who was having an ultrasound to evaluate her pelvis due to extreme morbidity and was noted to have dilation of her upper cervical canal with a 1.3-cm mass of the upper most portion of the cervix, could not be determined whether this was a prolapsing small fibroid versus a cervical polyp and the patient elects for definitive surgery.  FINDINGS:  A cervical polyp that was hide within the canal; however, hysteroscope of the cavity showed normal cavity architecture, normal ostia and no extension of the polyp base into the lower uterine segment.  COMPLICATIONS:  None.  PATHOLOGY:  Polyp.  DESCRIPTION OF PROCEDURE:  The patient was taken to the operating room, placed in the dorsal lithotomy position, prepped and draped in usual sterile fashion.  Careful attention was taken to placement and positioning of the patient due to her morbid obesity.  A sterile weighted speculum was placed in the vagina.  The cervix was visualized, stabilized with a single-tooth tenaculum.  The paracervical block with equal parts 2% lidocaine, 0.25% Marcaine was placed.  Due to the fact that this was felt to be a cervical lesion, the cervix was not dilated at this point, and the smallest TRUCLEAR hysteroscope was  slowly advanced to the cervix.  As the patient had Cytotec the night before, no further dilation was needed.  As the canal was inspected, a pedunculated polyp was noted.  It was carefully dissected off with the TRUCLEAR down to the stalk.  At this point, the [facility] was removed.  We felt that we were still within the cervical cavity.  The hysteroscope was then advanced through the cervix and into the lower uterine segment.  The inspection of the cavity was unremarkable with normal architecture and tubal ostia.  The hysteroscope was then removed.  The patient tolerated the procedure well.  Sponge, lap, needle counts were correct x2.  She was transferred to the recovery room in stable condition.     Ivor Costa. Farrel Gobble, M.D.     THL/MEDQ  D:  03/01/2013  T:  03/02/2013  Job:  [id number]

## 2013-03-02 NOTE — Op Note (Signed)
NAME:  Dominique Mcpherson, Dominique Mcpherson                 ACCOUNT NO.:  [id number]  MEDICAL RECORD NO.:  [id number]  LOCATION:                                 FACILITY:  PHYSICIAN:  Elin Fenley H. Mikaelah Trostle, M.D. DATE OF BIRTH:  01/19/1966  DATE OF PROCEDURE:  01/31/2013 DATE OF DISCHARGE:  01/31/2013                              OPERATIVE REPORT   PREOPERATIVE DIAGNOSIS:  Cervical polyp versus prolapsing fibroid.  POSTOPERATIVE DIAGNOSIS:  Cervical polyp.  PROCEDURE:  D and C, hysteroscopy with TRUCLEAR hysteroscope.  SURGEON:  Friend Dorfman H. Maeola Mchaney, M.D.  ASSISTANT:  None.  ANESTHESIA:  MAC with paracervical block.  ESTIMATED BLOOD LOSS:  Minimal.  INDICATIONS:  The patient is a 47-year-old morbidly obese patient, who was having an ultrasound to evaluate her pelvis due to extreme morbidity and was noted to have dilation of her upper cervical canal with a 1.3-cm mass of the upper most portion of the cervix, could not be determined whether this was a prolapsing small fibroid versus a cervical polyp and the patient elects for definitive surgery.  FINDINGS:  A cervical polyp that was hide within the canal; however, hysteroscope of the cavity showed normal cavity architecture, normal ostia and no extension of the polyp base into the lower uterine segment.  COMPLICATIONS:  None.  PATHOLOGY:  Polyp.  DESCRIPTION OF PROCEDURE:  The patient was taken to the operating room, placed in the dorsal lithotomy position, prepped and draped in usual sterile fashion.  Careful attention was taken to placement and positioning of the patient due to her morbid obesity.  A sterile weighted speculum was placed in the vagina.  The cervix was visualized, stabilized with a single-tooth tenaculum.  The paracervical block with equal parts 2% lidocaine, 0.25% Marcaine was placed.  Due to the fact that this was felt to be a cervical lesion, the cervix was not dilated at this point, and the smallest TRUCLEAR hysteroscope was  slowly advanced to the cervix.  As the patient had Cytotec the night before, no further dilation was needed.  As the canal was inspected, a pedunculated polyp was noted.  It was carefully dissected off with the TRUCLEAR down to the stalk.  At this point, the TRUCLEAR was removed.  We felt that we were still within the cervical cavity.  The hysteroscope was then advanced through the cervix and into the lower uterine segment.  The inspection of the cavity was unremarkable with normal architecture and tubal ostia.  The hysteroscope was then removed.  The patient tolerated the procedure well.  Sponge, lap, needle counts were correct x2.  She was transferred to the recovery room in stable condition.     Dominique Mcpherson, M.D.     THL/MEDQ  D:  03/01/2013  T:  03/02/2013  Job:  [id number] 

## 2013-03-08 ENCOUNTER — Other Ambulatory Visit: Payer: Self-pay

## 2013-03-30 ENCOUNTER — Other Ambulatory Visit: Payer: Self-pay

## 2013-03-30 DIAGNOSIS — Z1231 Encounter for screening mammogram for malignant neoplasm of breast: Secondary | ICD-10-CM

## 2013-04-01 ENCOUNTER — Encounter: Payer: Self-pay | Admitting: Gynecology

## 2013-04-01 ENCOUNTER — Other Ambulatory Visit: Payer: Self-pay | Admitting: Certified Nurse Midwife

## 2013-04-01 ENCOUNTER — Other Ambulatory Visit: Payer: Self-pay | Admitting: Obstetrics & Gynecology

## 2013-04-02 NOTE — Telephone Encounter (Signed)
Last level was checked 05/02/13 it was at 25 patient was seen for recheck of meds 05/08/12 no level was checked nor was a rx given.  Please advise.

## 2013-04-02 NOTE — Telephone Encounter (Signed)
Patient notified that all refills have been sent left a message regarding synthroid rx being sent x 1 year as well.

## 2013-04-02 NOTE — Telephone Encounter (Signed)
AEX 12/04/13 per Dr. Farrel Gobble okay to refill until then. #30/2 refills sent to last patient.

## 2013-04-02 NOTE — Telephone Encounter (Signed)
AEX scheduled for 12/04/13 #26/2 refills sent to last patient until then.

## 2013-04-02 NOTE — Telephone Encounter (Signed)
Per Dr. Farrel Gobble okay to refill until AEX will recheck Vitamin D level then.

## 2013-04-02 NOTE — Telephone Encounter (Signed)
Last refilled 07/19/12 #30/3 refills  AEX scheduled for 12/04/12 with Ms. Debbie  Please advise.

## 2013-04-19 ENCOUNTER — Other Ambulatory Visit: Payer: Self-pay | Admitting: Physician Assistant

## 2013-04-27 ENCOUNTER — Ambulatory Visit (INDEPENDENT_AMBULATORY_CARE_PROVIDER_SITE_OTHER): Payer: BC Managed Care – PPO | Admitting: Podiatry

## 2013-04-27 ENCOUNTER — Ambulatory Visit (INDEPENDENT_AMBULATORY_CARE_PROVIDER_SITE_OTHER): Payer: BC Managed Care – PPO

## 2013-04-27 ENCOUNTER — Encounter: Payer: Self-pay | Admitting: Podiatry

## 2013-04-27 VITALS — BP 136/77 | HR 77 | Resp 16 | Ht 64.0 in | Wt 350.0 lb

## 2013-04-27 DIAGNOSIS — M722 Plantar fascial fibromatosis: Secondary | ICD-10-CM

## 2013-04-27 DIAGNOSIS — M79672 Pain in left foot: Secondary | ICD-10-CM

## 2013-04-27 DIAGNOSIS — M79609 Pain in unspecified limb: Secondary | ICD-10-CM

## 2013-04-27 DIAGNOSIS — M7660 Achilles tendinitis, unspecified leg: Secondary | ICD-10-CM

## 2013-04-27 MED ORDER — TRIAMCINOLONE ACETONIDE 10 MG/ML IJ SUSP
10.0000 mg | Freq: Once | INTRAMUSCULAR | Status: AC
  Administered 2013-04-27: 10 mg

## 2013-04-27 NOTE — Progress Notes (Signed)
Subjective:     Patient ID: Dominique Mcpherson, female   DOB: [birth year], 47 y.o.   MRN: [id number]  HPI patient presents stating my heel is very sore on my left foot for the last several months. Does not remember specific injury to. Also did not pick up her orthotics like she was suppose to last year   Review of Systems     Objective:   Physical Exam Neurovascular status intact with negative Homans sign noted and no equinus condition noted. Patient's left plantar heel is tender at the insertion of the calcaneus and plantar fascia    Assessment:     Chronic plantar fasciitis left with weightbearing issues and admits that she did much better with immobilization    Plan:     Reviewed condition and at this time dispensed air fracture walker to completely take stress off the plantar heel. Injected the plantar fascia 3 mg Kenalog 5 of Xylocaine Marcaine mixture and reviewed x-rays

## 2013-04-30 ENCOUNTER — Encounter (INDEPENDENT_AMBULATORY_CARE_PROVIDER_SITE_OTHER): Payer: Self-pay | Admitting: Surgery

## 2013-05-04 ENCOUNTER — Ambulatory Visit
Admission: RE | Admit: 2013-05-04 | Discharge: 2013-05-04 | Disposition: A | Discharge status: Home / Self Care | Payer: BC Managed Care – PPO | Source: Ambulatory Visit

## 2013-05-04 DIAGNOSIS — Z1231 Encounter for screening mammogram for malignant neoplasm of breast: Secondary | ICD-10-CM

## 2013-05-09 ENCOUNTER — Encounter (INDEPENDENT_AMBULATORY_CARE_PROVIDER_SITE_OTHER): Payer: Self-pay | Admitting: Surgery

## 2013-05-09 ENCOUNTER — Ambulatory Visit (INDEPENDENT_AMBULATORY_CARE_PROVIDER_SITE_OTHER): Payer: BC Managed Care – PPO | Admitting: Surgery

## 2013-05-09 VITALS — BP 128/82 | HR 84 | Temp 97.2°F | Resp 18 | Ht 64.0 in | Wt 348.0 lb

## 2013-05-09 DIAGNOSIS — C4360 Malignant melanoma of unspecified upper limb, including shoulder: Secondary | ICD-10-CM

## 2013-05-09 DIAGNOSIS — C4361 Malignant melanoma of right upper limb, including shoulder: Secondary | ICD-10-CM

## 2013-05-09 NOTE — Progress Notes (Signed)
Patient ID: Dominique Mcpherson, female   DOB: 28-Aug-1965, 48 y.o.   MRN: [id number]  Chief Complaint  Patient presents with  . Other    Eval melanoma rt upper arm    HPI Dominique Mcpherson is a 48 y.o. female.   HPI Right upper arm melanoma  She is referred by Dr. Terri Piedra after the recent diagnosis of a right upper arm melanoma. She presents for consideration for wide excision.  She has no other complaints. Past Medical History  Diagnosis Date  . Acid reflux   . Anxiety   . Depression   . Knee osteoarthritis     with degenerative medial meniscus tear  . Meniscus tear   . Hypothyroidism     Past Surgical History  Procedure Laterality Date  . Cholecystectomy    . Tonsillectomy    . Foot surgery      bilateral plantar fascitis  . Hysteroscopy w/d&c N/A 01/31/2013    Procedure: DILATATION AND CURETTAGE  TRU CLEAR /HYSTEROSCOPY;  Surgeon: Bennye Alm, MD;  Location: [facility];  Service: Gynecology;  Laterality: N/A;    Family History  Problem Relation Age of Onset  . Hypertension Brother   . Cancer Paternal Grandmother     colon  . Liver cancer Paternal Grandmother     Social History History  Substance Use Topics  . Smoking status: Never Smoker   . Smokeless tobacco: Not on file  . Alcohol Use: 1.0 oz/week    2 drink(s) per week     Comment: occ    No Known Allergies  Current Outpatient Prescriptions  Medication Sig Dispense Refill  . buPROPion (WELLBUTRIN XL) 300 MG 24 hr tablet Take 1 tablet (300 mg total) by mouth daily.  30 tablet  12  . Cholecalciferol (VITAMIN D) 2000 UNITS tablet Take 2,000 Units by mouth every other day.      Marland Kitchen dexlansoprazole (DEXILANT) 60 MG capsule Take 60 mg by mouth daily.      . diclofenac (VOLTAREN) 75 MG EC tablet Take 75 mg by mouth daily.      Marland Kitchen ibuprofen (ADVIL,MOTRIN) 200 MG tablet Take 800 mg by mouth every 6 (six) hours as needed for pain.      Marland Kitchen levothyroxine (SYNTHROID, LEVOTHROID) 75 MCG tablet TAKE 1 TABLET BY MOUTH EVERY DAY  90  tablet  3  . norethindrone (AYGESTIN) 5 MG tablet TAKE 1 TABLET BY MOUTH ONCE DAILY ON DAYS 1-10 OF EACH MONTH  30 tablet  2  . Vitamin D, Ergocalciferol, (DRISDOL) 50000 UNITS CAPS capsule TAKE 1 CAPSULE BY MOUTH ONCE WEEKLY  12 capsule  0  . acetaminophen (TYLENOL) 500 MG tablet Take 1,000 mg by mouth as needed.        No current facility-administered medications for this visit.    Review of Systems Review of Systems  Constitutional: Negative for fever, chills and unexpected weight change.  HENT: Negative for congestion, hearing loss, sore throat, trouble swallowing and voice change.   Eyes: Negative for visual disturbance.  Respiratory: Negative for cough and wheezing.   Cardiovascular: Negative for chest pain, palpitations and leg swelling.  Gastrointestinal: Negative for nausea, vomiting, abdominal pain, diarrhea, constipation, blood in stool, abdominal distention and anal bleeding.  Genitourinary: Negative for hematuria, vaginal bleeding and difficulty urinating.  Musculoskeletal: Positive for arthralgias.  Skin: Negative for rash and wound.  Neurological: Negative for seizures, syncope and headaches.  Hematological: Negative for adenopathy. Does not bruise/bleed easily.  Psychiatric/Behavioral: Negative for confusion.  Blood pressure 128/82, pulse 84, temperature 97.2 F (36.2 C), temperature source Temporal, resp. rate 18, height 5\' 4"  (1.626 m), weight 348 lb (157.852 kg).  Physical Exam Physical Exam  Constitutional: She appears well-developed and well-nourished. No distress.  HENT:  Head: Normocephalic and atraumatic.  Right Ear: External ear normal.  Left Ear: External ear normal.  Mouth/Throat: No oropharyngeal exudate.  Eyes: Conjunctivae are normal. Pupils are equal, round, and reactive to light.  Cardiovascular: Normal rate and regular rhythm.   Murmur heard. Pulmonary/Chest: Effort normal and breath sounds normal. No respiratory distress. She has no wheezes.   Skin: Skin is warm and dry. No rash noted. No erythema.  There is a healing scar on the right upper arm medially from her melanoma biopsy  Psychiatric: Her behavior is normal. Judgment normal.    Data Reviewed The pathology revealed a 0.25 mm melanoma with negative margins  Assessment    Right upper arm melanoma     Plan    Wide excision is recommended of the right upper arm biopsy site. I explained the reasonings of this with her. I discussed the risks of surgery which includes but is not limited to bleeding, infection, recurrence, need for further surgery, et [name]. She understands and wished to proceed. Surgery will be scheduled        Johany Hansman A 05/09/2013, 3:49 PM

## 2013-05-10 ENCOUNTER — Ambulatory Visit (INDEPENDENT_AMBULATORY_CARE_PROVIDER_SITE_OTHER): Payer: BC Managed Care – PPO | Admitting: Podiatry

## 2013-05-10 ENCOUNTER — Encounter: Payer: Self-pay | Admitting: Podiatry

## 2013-05-10 VITALS — BP 148/83 | HR 85 | Resp 16

## 2013-05-10 DIAGNOSIS — M722 Plantar fascial fibromatosis: Secondary | ICD-10-CM

## 2013-05-10 MED ORDER — TRIAMCINOLONE ACETONIDE 10 MG/ML IJ SUSP
10.0000 mg | Freq: Once | INTRAMUSCULAR | Status: AC
  Administered 2013-05-10: 10 mg

## 2013-05-10 NOTE — Patient Instructions (Signed)

## 2013-05-10 NOTE — Progress Notes (Signed)
Subjective:     Patient ID: Dominique Mcpherson, female   DOB: 1965-07-19, 48 y.o.   MRN: [id number]  HPI patient presents stating my heel feels some better with the boot but still hurting me at times   Review of Systems     Objective:   Physical Exam Neurovascular status intact with continued discomfort plantar heel left at the insertion of the tendon into the calcaneus    Assessment:     Plantar fasciitis left heel improved but present    Plan:     Instructed on continued boot usage but gradual reduction over 4 weeks and injected the plantar fascia 3 mg Kenalog 5 mg Xylocaine Marcaine mixture at this time

## 2013-05-14 ENCOUNTER — Encounter (HOSPITAL_BASED_OUTPATIENT_CLINIC_OR_DEPARTMENT_OTHER): Payer: Self-pay | Admitting: *Deleted

## 2013-05-14 NOTE — Progress Notes (Signed)
No labs noted-denies any cardiac or resp problems-denies snoring or sleep apnea

## 2013-05-17 ENCOUNTER — Encounter (HOSPITAL_BASED_OUTPATIENT_CLINIC_OR_DEPARTMENT_OTHER)
Admission: RE | Disposition: A | Discharge status: Home / Self Care | Payer: Self-pay | Source: Ambulatory Visit | Attending: Surgery

## 2013-05-17 ENCOUNTER — Ambulatory Visit (HOSPITAL_BASED_OUTPATIENT_CLINIC_OR_DEPARTMENT_OTHER): Payer: BC Managed Care – PPO | Admitting: Anesthesiology

## 2013-05-17 ENCOUNTER — Ambulatory Visit (HOSPITAL_BASED_OUTPATIENT_CLINIC_OR_DEPARTMENT_OTHER)
Admission: RE | Admit: 2013-05-17 | Discharge: 2013-05-17 | Disposition: A | Discharge status: Home / Self Care | Payer: BC Managed Care – PPO | Source: Ambulatory Visit | Attending: Surgery | Admitting: Surgery

## 2013-05-17 ENCOUNTER — Encounter (HOSPITAL_BASED_OUTPATIENT_CLINIC_OR_DEPARTMENT_OTHER): Payer: BC Managed Care – PPO | Admitting: Anesthesiology

## 2013-05-17 DIAGNOSIS — C4360 Malignant melanoma of unspecified upper limb, including shoulder: Secondary | ICD-10-CM

## 2013-05-17 DIAGNOSIS — Z6841 Body Mass Index (BMI) 40.0 and over, adult: Secondary | ICD-10-CM | POA: Insufficient documentation

## 2013-05-17 DIAGNOSIS — E039 Hypothyroidism, unspecified: Secondary | ICD-10-CM | POA: Insufficient documentation

## 2013-05-17 DIAGNOSIS — F411 Generalized anxiety disorder: Secondary | ICD-10-CM | POA: Insufficient documentation

## 2013-05-17 DIAGNOSIS — F329 Major depressive disorder, single episode, unspecified: Secondary | ICD-10-CM | POA: Insufficient documentation

## 2013-05-17 DIAGNOSIS — K219 Gastro-esophageal reflux disease without esophagitis: Secondary | ICD-10-CM | POA: Insufficient documentation

## 2013-05-17 DIAGNOSIS — F3289 Other specified depressive episodes: Secondary | ICD-10-CM | POA: Insufficient documentation

## 2013-05-17 HISTORY — PX: MELANOMA EXCISION: SHX5266

## 2013-05-17 SURGERY — EXCISION, MELANOMA
Anesthesia: General | Site: Arm Upper | Laterality: Right

## 2013-05-17 MED ORDER — HYDROCODONE-ACETAMINOPHEN 5-325 MG PO TABS: 1.0000 | ORAL_TABLET | ORAL | Status: DC | PRN

## 2013-05-17 MED ORDER — PROPOFOL 10 MG/ML IV BOLUS
INTRAVENOUS | Status: DC | PRN
  Administered 2013-05-17: 50 mg via INTRAVENOUS
  Administered 2013-05-17: 200 mg via INTRAVENOUS

## 2013-05-17 MED ORDER — MIDAZOLAM HCL 5 MG/5ML IJ SOLN
INTRAMUSCULAR | Status: DC | PRN
  Administered 2013-05-17: 2 mg via INTRAVENOUS

## 2013-05-17 MED ORDER — LACTATED RINGERS IV SOLN
INTRAVENOUS | Status: DC
  Administered 2013-05-17: 12:00:00 via INTRAVENOUS

## 2013-05-17 MED ORDER — BUPIVACAINE-EPINEPHRINE 0.5% -1:200000 IJ SOLN
INTRAMUSCULAR | Status: DC | PRN
  Administered 2013-05-17: 10 mL

## 2013-05-17 MED ORDER — LIDOCAINE HCL (CARDIAC) 20 MG/ML IV SOLN
INTRAVENOUS | Status: DC | PRN
  Administered 2013-05-17: 75 mg via INTRAVENOUS
  Administered 2013-05-17: 25 mg via INTRAVENOUS

## 2013-05-17 MED ORDER — MIDAZOLAM HCL 2 MG/2ML IJ SOLN
INTRAMUSCULAR | Status: AC
  Filled 2013-05-17: qty 2

## 2013-05-17 MED ORDER — FENTANYL CITRATE 0.05 MG/ML IJ SOLN
INTRAMUSCULAR | Status: DC | PRN
  Administered 2013-05-17: 100 ug via INTRAVENOUS

## 2013-05-17 MED ORDER — DEXAMETHASONE SODIUM PHOSPHATE 4 MG/ML IJ SOLN
INTRAMUSCULAR | Status: DC | PRN
  Administered 2013-05-17: 10 mg via INTRAVENOUS

## 2013-05-17 MED ORDER — FENTANYL CITRATE 0.05 MG/ML IJ SOLN
INTRAMUSCULAR | Status: AC
  Filled 2013-05-17: qty 6

## 2013-05-17 MED ORDER — MIDAZOLAM HCL 2 MG/2ML IJ SOLN: 1.0000 mg | INTRAMUSCULAR | Status: DC | PRN

## 2013-05-17 MED ORDER — CEFAZOLIN SODIUM 1-5 GM-% IV SOLN
INTRAVENOUS | Status: AC
  Filled 2013-05-17: qty 50

## 2013-05-17 MED ORDER — OXYCODONE HCL 5 MG/5ML PO SOLN: 5.0000 mg | Freq: Once | ORAL | Status: DC | PRN

## 2013-05-17 MED ORDER — CEFAZOLIN SODIUM-DEXTROSE 2-3 GM-% IV SOLR
INTRAVENOUS | Status: AC
  Filled 2013-05-17: qty 50

## 2013-05-17 MED ORDER — HYDROMORPHONE HCL PF 1 MG/ML IJ SOLN: 0.2500 mg | INTRAMUSCULAR | Status: DC | PRN

## 2013-05-17 MED ORDER — ONDANSETRON HCL 4 MG/2ML IJ SOLN
INTRAMUSCULAR | Status: DC | PRN
  Administered 2013-05-17: 4 mg via INTRAVENOUS

## 2013-05-17 MED ORDER — LACTATED RINGERS IV SOLN
INTRAVENOUS | Status: DC | PRN
  Administered 2013-05-17: 12:00:00 via INTRAVENOUS

## 2013-05-17 MED ORDER — FENTANYL CITRATE 0.05 MG/ML IJ SOLN: 50.0000 ug | INTRAMUSCULAR | Status: DC | PRN

## 2013-05-17 MED ORDER — CEFAZOLIN SODIUM-DEXTROSE 2-3 GM-% IV SOLR
2.0000 g | INTRAVENOUS | Status: AC
  Administered 2013-05-17: 3 g via INTRAVENOUS

## 2013-05-17 MED ORDER — OXYCODONE HCL 5 MG PO TABS: 5.0000 mg | ORAL_TABLET | Freq: Once | ORAL | Status: DC | PRN

## 2013-05-17 SURGICAL SUPPLY — 55 items
APL SKNCLS STERI-STRIP NONHPOA (GAUZE/BANDAGES/DRESSINGS) ×1
BANDAGE ELASTIC 3 VELCRO ST LF (GAUZE/BANDAGES/DRESSINGS) IMPLANT
BENZOIN TINCTURE PRP APPL 2/3 (GAUZE/BANDAGES/DRESSINGS) ×1 IMPLANT
BLADE HEX COATED 2.75 (ELECTRODE) ×2 IMPLANT
BLADE SURG 15 STRL LF DISP TIS (BLADE) ×1 IMPLANT
BLADE SURG 15 STRL SS (BLADE) ×2
BLADE SURG ROTATE 9660 (MISCELLANEOUS) IMPLANT
BNDG CONFORM 3 STRL LF (GAUZE/BANDAGES/DRESSINGS) IMPLANT
CANISTER SUCT 1200ML W/VALVE (MISCELLANEOUS) IMPLANT
CHLORAPREP W/TINT 26ML (MISCELLANEOUS) ×2 IMPLANT
COVER MAYO STAND STRL (DRAPES) ×2 IMPLANT
COVER TABLE BACK 60X90 (DRAPES) ×2 IMPLANT
DECANTER SPIKE VIAL GLASS SM (MISCELLANEOUS) IMPLANT
DRAPE EXTREMITY T 121X128X90 (DRAPE) IMPLANT
DRAPE PED LAPAROTOMY (DRAPES) IMPLANT
DRAPE UTILITY XL STRL (DRAPES) ×2 IMPLANT
DRSG EMULSION OIL 3X3 NADH (GAUZE/BANDAGES/DRESSINGS) IMPLANT
DRSG TEGADERM 4X4.75 (GAUZE/BANDAGES/DRESSINGS) ×1 IMPLANT
ELECT REM PT RETURN 9FT ADLT (ELECTROSURGICAL) ×2
ELECTRODE REM PT RTRN 9FT ADLT (ELECTROSURGICAL) ×1 IMPLANT
GLOVE BIOGEL PI IND STRL 6.5 (GLOVE) IMPLANT
GLOVE BIOGEL PI IND STRL 7.5 (GLOVE) IMPLANT
GLOVE BIOGEL PI INDICATOR 6.5 (GLOVE) ×1
GLOVE BIOGEL PI INDICATOR 7.5 (GLOVE) ×1
GLOVE ECLIPSE 6.5 STRL STRAW (GLOVE) ×1 IMPLANT
GLOVE ECLIPSE 7.0 STRL STRAW (GLOVE) ×1 IMPLANT
GLOVE SURG SIGNA 7.5 PF LTX (GLOVE) ×2 IMPLANT
GOWN STRL REUS W/ TWL LRG LVL3 (GOWN DISPOSABLE) ×1 IMPLANT
GOWN STRL REUS W/ TWL XL LVL3 (GOWN DISPOSABLE) ×1 IMPLANT
GOWN STRL REUS W/TWL LRG LVL3 (GOWN DISPOSABLE) ×4
GOWN STRL REUS W/TWL XL LVL3 (GOWN DISPOSABLE) ×2
NDL HYPO 25X1 1.5 SAFETY (NEEDLE) ×1 IMPLANT
NEEDLE HYPO 25X1 1.5 SAFETY (NEEDLE) ×2 IMPLANT
NS IRRIG 1000ML POUR BTL (IV SOLUTION) IMPLANT
PACK BASIN DAY SURGERY FS (CUSTOM PROCEDURE TRAY) ×2 IMPLANT
PENCIL BUTTON HOLSTER BLD 10FT (ELECTRODE) ×2 IMPLANT
SLEEVE SCD COMPRESS KNEE MED (MISCELLANEOUS) IMPLANT
SPONGE GAUZE 4X4 12PLY STER LF (GAUZE/BANDAGES/DRESSINGS) ×2 IMPLANT
SPONGE LAP 4X18 X RAY DECT (DISPOSABLE) IMPLANT
STRIP CLOSURE SKIN 1/2X4 (GAUZE/BANDAGES/DRESSINGS) ×1 IMPLANT
SUT ETHILON 3 0 PS 1 (SUTURE) ×1 IMPLANT
SUT ETHILON 4 0 PS 2 18 (SUTURE) IMPLANT
SUT MNCRL AB 4-0 PS2 18 (SUTURE) ×1 IMPLANT
SUT PROLENE 3 0 PS 2 (SUTURE) IMPLANT
SUT VIC AB 2-0 SH 27 (SUTURE)
SUT VIC AB 2-0 SH 27XBRD (SUTURE) IMPLANT
SUT VIC AB 3-0 SH 27 (SUTURE) ×2
SUT VIC AB 3-0 SH 27X BRD (SUTURE) IMPLANT
SYR BULB 3OZ (MISCELLANEOUS) IMPLANT
SYR CONTROL 10ML LL (SYRINGE) ×2 IMPLANT
TOWEL OR 17X24 6PK STRL BLUE (TOWEL DISPOSABLE) ×2 IMPLANT
TOWEL OR NON WOVEN STRL DISP B (DISPOSABLE) ×2 IMPLANT
TRAY DSU PREP LF (CUSTOM PROCEDURE TRAY) IMPLANT
TUBE CONNECTING 20X1/4 (TUBING) IMPLANT
YANKAUER SUCT BULB TIP NO VENT (SUCTIONS) IMPLANT

## 2013-05-17 NOTE — OR Nursing (Signed)
SCD Hose were  not applied to patient's lower legs; Error in documentation

## 2013-05-17 NOTE — Discharge Instructions (Signed)
Ok to shower tomorrow  Leave steri strips on incision at least one week  No lifting more than 15 pounds for one week  Ice pack and ibuprofen also for pain   Post Anesthesia Home Care Instructions  Activity: Get plenty of rest for the remainder of the day. A responsible adult should stay with you for 24 hours following the procedure.  For the next 24 hours, DO NOT: -Drive a car -Paediatric nurse -Drink alcoholic beverages -Take any medication unless instructed by your physician -Make any legal decisions or sign important papers.  Meals: Start with liquid foods such as gelatin or soup. Progress to regular foods as tolerated. Avoid greasy, spicy, heavy foods. If nausea and/or vomiting occur, drink only clear liquids until the nausea and/or vomiting subsides. Call your physician if vomiting continues.  Special Instructions/Symptoms: Your throat may feel dry or sore from the anesthesia or the breathing tube placed in your throat during surgery. If this causes discomfort, gargle with warm salt water. The discomfort should disappear within 24 hours.

## 2013-05-17 NOTE — Anesthesia Preprocedure Evaluation (Addendum)
Anesthesia Evaluation  Patient identified by MRN, date of birth, ID band Patient awake    Reviewed: Allergy & Precautions, H&P , NPO status , Patient's Chart, lab work & pertinent test results  Airway Mallampati: III TM Distance: >3 FB Neck ROM: Full    Dental no notable dental hx. (+) Teeth Intact and Dental Advisory Given   Pulmonary neg pulmonary ROS,  breath sounds clear to auscultation  Pulmonary exam normal       Cardiovascular negative cardio ROS  Rhythm:Regular Rate:Normal     Neuro/Psych Anxiety Depression negative neurological ROS  negative psych ROS   GI/Hepatic Neg liver ROS, GERD-  Medicated and Controlled,  Endo/Other  Hypothyroidism Morbid obesity  Renal/GU negative Renal ROS  negative genitourinary   Musculoskeletal   Abdominal   Peds  Hematology negative hematology ROS (+)   Anesthesia Other Findings   Reproductive/Obstetrics negative OB ROS                          Anesthesia Physical Anesthesia Plan  ASA: III  Anesthesia Plan: General   Post-op Pain Management:    Induction: Intravenous  Airway Management Planned: LMA  Additional Equipment:   Intra-op Plan:   Post-operative Plan: Extubation in OR  Informed Consent: I have reviewed the patients History and Physical, chart, labs and discussed the procedure including the risks, benefits and alternatives for the proposed anesthesia with the patient or authorized representative who has indicated his/her understanding and acceptance.   Dental advisory given  Plan Discussed with: CRNA  Anesthesia Plan Comments:         Anesthesia Quick Evaluation

## 2013-05-17 NOTE — Op Note (Signed)
WIDE EXCISION MELANOMA RIGHT UPPER ARM  Procedure Note  Dominique Mcpherson 05/17/2013   Pre-op Diagnosis: right upper arm melanoma     Post-op Diagnosis: same  Procedure(s): WIDE EXCISION MELANOMA RIGHT UPPER ARM  Surgeon(s): Harl Bowie, MD  Anesthesia: General  Staff:  Circulator: Timmie Foerster Ward, RN Relief Circulator: Josie Dixon, RN Scrub Person: Glenna Fellows, RN  Estimated Blood Loss: Minimal               Specimens: sent to path          [facility] A   Date: 05/17/2013  Time: 12:26 PM

## 2013-05-17 NOTE — H&P (Signed)
Patient ID: Dominique Mcpherson, female DOB: January 08, 1966, 48 y.o. MRN: [id number]  Chief Complaint   Patient presents with   .  Other     Eval melanoma rt upper arm   HPI  Dominique Mcpherson is a 48 y.o. female.  HPI  Right upper arm melanoma  She is referred by Dr. Allyson Sabal after the recent diagnosis of a right upper arm melanoma. She presents for consideration for wide excision. She has no other complaints.  Past Medical History   Diagnosis  Date   .  Acid reflux    .  Anxiety    .  Depression    .  Knee osteoarthritis      with degenerative medial meniscus tear   .  Meniscus tear    .  Hypothyroidism     Past Surgical History   Procedure  Laterality  Date   .  Cholecystectomy     .  Tonsillectomy     .  Foot surgery       bilateral plantar fascitis   .  Hysteroscopy w/d&c  N/A  01/31/2013     Procedure: DILATATION AND CURETTAGE TRU CLEAR /HYSTEROSCOPY; Surgeon: Azalia Bilis, MD; Location: [facility]; Service: Gynecology; Laterality: N/A;    Family History   Problem  Relation  Age of Onset   .  Hypertension  Brother    .  Cancer  Paternal Grandmother      colon   .  Liver cancer  Paternal Grandmother    Social History  History   Substance Use Topics   .  Smoking status:  Never Smoker   .  Smokeless tobacco:  Not on file   .  Alcohol Use:  1.0 oz/week     2 drink(s) per week      Comment: occ   No Known Allergies  Current Outpatient Prescriptions   Medication  Sig  Dispense  Refill   .  buPROPion (WELLBUTRIN XL) 300 MG 24 hr tablet  Take 1 tablet (300 mg total) by mouth daily.  30 tablet  12   .  Cholecalciferol (VITAMIN D) 2000 UNITS tablet  Take 2,000 Units by mouth every other day.     Marland Kitchen  dexlansoprazole (DEXILANT) 60 MG capsule  Take 60 mg by mouth daily.     .  diclofenac (VOLTAREN) 75 MG EC tablet  Take 75 mg by mouth daily.     Marland Kitchen  ibuprofen (ADVIL,MOTRIN) 200 MG tablet  Take 800 mg by mouth every 6 (six) hours as needed for pain.     Marland Kitchen  levothyroxine (SYNTHROID, LEVOTHROID)  75 MCG tablet  TAKE 1 TABLET BY MOUTH EVERY DAY  90 tablet  3   .  norethindrone (AYGESTIN) 5 MG tablet  TAKE 1 TABLET BY MOUTH ONCE DAILY ON DAYS 1-10 OF EACH MONTH  30 tablet  2   .  Vitamin D, Ergocalciferol, (DRISDOL) 50000 UNITS CAPS capsule  TAKE 1 CAPSULE BY MOUTH ONCE WEEKLY  12 capsule  0   .  acetaminophen (TYLENOL) 500 MG tablet  Take 1,000 mg by mouth as needed.      No current facility-administered medications for this visit.   Review of Systems  Review of Systems  Constitutional: Negative for fever, chills and unexpected weight change.  HENT: Negative for congestion, hearing loss, sore throat, trouble swallowing and voice change.  Eyes: Negative for visual disturbance.  Respiratory: Negative for cough and wheezing.  Cardiovascular: Negative for chest pain,  palpitations and leg swelling.  Gastrointestinal: Negative for nausea, vomiting, abdominal pain, diarrhea, constipation, blood in stool, abdominal distention and anal bleeding.  Genitourinary: Negative for hematuria, vaginal bleeding and difficulty urinating.  Musculoskeletal: Positive for arthralgias.  Skin: Negative for rash and wound.  Neurological: Negative for seizures, syncope and headaches.  Hematological: Negative for adenopathy. Does not bruise/bleed easily.  Psychiatric/Behavioral: Negative for confusion.  Blood pressure 128/82, pulse 84, temperature 97.2 F (36.2 C), temperature source Temporal, resp. rate 18, height 5\' 4"  (1.626 m), weight 348 lb (157.852 kg).  Physical Exam  Physical Exam  Constitutional: She appears well-developed and well-nourished. No distress.  HENT:  Head: Normocephalic and atraumatic.  Right Ear: External ear normal.  Left Ear: External ear normal.  Mouth/Throat: No oropharyngeal exudate.  Eyes: Conjunctivae are normal. Pupils are equal, round, and reactive to light.  Cardiovascular: Normal rate and regular rhythm.  Murmur heard.  Pulmonary/Chest: Effort normal and breath sounds  normal. No respiratory distress. She has no wheezes.  Skin: Skin is warm and dry. No rash noted. No erythema.  There is a healing scar on the right upper arm medially from her melanoma biopsy  Psychiatric: Her behavior is normal. Judgment normal.  Data Reviewed  The pathology revealed a 0.25 mm melanoma with negative margins  Assessment  Right upper arm melanoma  Plan  Wide excision is recommended of the right upper arm biopsy site. I explained the reasonings of this with her. I discussed the risks of surgery which includes but is not limited to bleeding, infection, recurrence, need for further surgery, et Ronney Asters. She understands and wished to proceed. Surgery will be scheduled

## 2013-05-17 NOTE — Transfer of Care (Signed)
Immediate Anesthesia Transfer of Care Note  Patient: Dominique Mcpherson  Procedure(s) Performed: Procedure(s): WIDE EXCISION MELANOMA RIGHT UPPER ARM (Right)  Patient Location: PACU  Anesthesia Type:General  Level of Consciousness: awake and patient cooperative  Airway & Oxygen Therapy: Patient Spontanous Breathing and Patient connected to face mask oxygen  Post-op Assessment: Report given to PACU RN and Post -op Vital signs reviewed and stable  Post vital signs: Reviewed and stable  Complications: No apparent anesthesia complications

## 2013-05-17 NOTE — Anesthesia Postprocedure Evaluation (Signed)
  Anesthesia Post-op Note  Patient: Dominique Mcpherson  Procedure(s) Performed: Procedure(s): WIDE EXCISION MELANOMA RIGHT UPPER ARM (Right)  Patient Location: PACU  Anesthesia Type:General  Level of Consciousness: awake, alert  and oriented  Airway and Oxygen Therapy: Patient Spontanous Breathing  Post-op Pain: mild  Post-op Assessment: Post-op Vital signs reviewed  Post-op Vital Signs: Reviewed  Complications: No apparent anesthesia complications

## 2013-05-18 ENCOUNTER — Encounter (HOSPITAL_BASED_OUTPATIENT_CLINIC_OR_DEPARTMENT_OTHER): Payer: Self-pay | Admitting: Surgery

## 2013-05-18 NOTE — Op Note (Signed)
NAMELLOYD, CULLINAN NO.:  [id number]  MEDICAL RECORD NO.:  [id number]  LOCATION:                                 FACILITY:  PHYSICIAN:  Coralie Keens, M.D. DATE OF BIRTH:  [birth year]  DATE OF PROCEDURE:  05/17/2013 DATE OF DISCHARGE:                              OPERATIVE REPORT   PREOPERATIVE DIAGNOSIS:  Malignant melanoma, right upper arm.  POSTOPERATIVE DIAGNOSIS:  Malignant melanoma, right upper arm.  PROCEDURE:  A 5-cm wide excision of melanoma, right upper arm.  SURGEON:  Coralie Keens, MD  ANESTHESIA:  General, 0.5% Marcaine.  ESTIMATED BLOOD LOSS:  Minimal.  INDICATION:  This is a 48 year old female who had an abnormal skin lesion, biopsied on her right upper arm above the antecubital fossa. The biopsy revealed a malignant melanoma.  The resection margins were negative.  Decision was made to proceed with re-excision of the biopsy site.  PROCEDURE IN DETAIL:  The patient was brought to the operating room and identified as Dominique Mcpherson.  She was placed supine on the operating room table and general anesthesia was induced.  Her right upper arm was then prepped and draped in usual sterile fashion.  I anesthetized the skin surrounding the previous scar from the biopsy with Marcaine.  I then made a 5-cm elliptical incision around the biopsy site.  I took this down into the subcutaneous tissue with the electrocautery.  I then completed the wide excision with cautery, taking all the subcutaneous tissue and fat underneath the lesion.  I then marked the specimen at the 12 o'clock position with a nylon suture.  The specimen was then sent to Pathology for evaluation.  Hemostasis was achieved with cautery.  I closed the subcutaneous tissue with interrupted 3-0 Vicryl sutures and closed the skin with running 4-0 Monocryl.  Steri-Strips, gauze, and tape were then applied.  The patient tolerated the procedure well.  All the counts were correct at the  end of the procedure.  The patient was then extubated in the operating room and taken in stable condition to the recovery room.     Coralie Keens, M.D.     DB/MEDQ  D:  05/17/2013  T:  05/18/2013  Job:  [id number]

## 2013-06-08 ENCOUNTER — Ambulatory Visit (INDEPENDENT_AMBULATORY_CARE_PROVIDER_SITE_OTHER): Payer: BC Managed Care – PPO | Admitting: Surgery

## 2013-06-08 VITALS — BP 130/78 | HR 78 | Temp 98.5°F | Resp 20 | Ht 64.0 in | Wt 347.0 lb

## 2013-06-08 DIAGNOSIS — Z09 Encounter for follow-up examination after completed treatment for conditions other than malignant neoplasm: Secondary | ICD-10-CM

## 2013-06-08 NOTE — Progress Notes (Signed)
Subjective:     Patient ID: Dominique Mcpherson, female   DOB: 03-06-1966, 48 y.o.   MRN: [id number]  HPI She is here for first postop visit status post wide excision of a melanoma on the arm. She is doing well  Review of Systems     Objective:   Physical Exam Her incision is healing well   The final pathology showed no residual melanoma Assessment:     Patient stable postop     Plan:     She may resume her normal activity. I will see her back as needed

## 2013-06-29 ENCOUNTER — Other Ambulatory Visit: Payer: Self-pay | Admitting: Certified Nurse Midwife

## 2013-06-29 NOTE — Telephone Encounter (Signed)
Last AEX 11/29/12 Last refill 04/02/13 #12/0 refills Next appt 12/04/2013  Will refill until next appt - per Dr. Brion Aliment refill encounter on 04/01/13.

## 2013-07-01 HISTORY — PX: UPPER GI ENDOSCOPY: SHX6162

## 2013-07-02 ENCOUNTER — Encounter: Payer: Self-pay | Admitting: Podiatry

## 2013-07-02 ENCOUNTER — Ambulatory Visit (INDEPENDENT_AMBULATORY_CARE_PROVIDER_SITE_OTHER): Payer: BC Managed Care – PPO | Admitting: Podiatry

## 2013-07-02 ENCOUNTER — Ambulatory Visit (INDEPENDENT_AMBULATORY_CARE_PROVIDER_SITE_OTHER): Payer: BC Managed Care – PPO

## 2013-07-02 VITALS — BP 154/73 | HR 84 | Resp 16

## 2013-07-02 DIAGNOSIS — M722 Plantar fascial fibromatosis: Secondary | ICD-10-CM

## 2013-07-02 DIAGNOSIS — R52 Pain, unspecified: Secondary | ICD-10-CM

## 2013-07-02 NOTE — Progress Notes (Signed)
Subjective:     Patient ID: Dominique Mcpherson, female   DOB: 08-03-1965, 48 y.o.   MRN: [id number]  HPI patient presents stating the bottom of my left heel has really been bothering me and is not responding to immobilization   Review of Systems     Objective:   Physical Exam Neurovascular status intact with pain in the more proximal portion of the plantar heel left central and medial side of the heel    Assessment:     Plantar fasciitis left heel but of a more proximal nature    Plan:     Discussed this with her and also discussed obesity and the fact she had surgery done years ago. At this point I do not think surgery is the best answer for her and I have recommended shockwave therapy to be done in a more proximal direction. Reappoint one week for shockwave therapy

## 2013-07-09 ENCOUNTER — Encounter: Payer: Self-pay | Admitting: Podiatry

## 2013-07-09 ENCOUNTER — Ambulatory Visit (INDEPENDENT_AMBULATORY_CARE_PROVIDER_SITE_OTHER): Payer: BC Managed Care – PPO | Admitting: Podiatry

## 2013-07-09 VITALS — BP 134/77 | HR 83 | Resp 18

## 2013-07-09 DIAGNOSIS — M722 Plantar fascial fibromatosis: Secondary | ICD-10-CM

## 2013-07-09 NOTE — Progress Notes (Signed)
Subjective:     Patient ID: Dominique Mcpherson, female   DOB: Jan 09, 1966, 48 y.o.   MRN: [id number]  HPI patient presents with very painful heel pain right   Review of Systems     Objective:   Physical Exam Pain to palpation plantar and mid fascial right on the calcaneus itself    Assessment:     Chronic plantar fasciitis    Plan:    shockwave was administered with 4.o at 15 frequency 2500 shocks

## 2013-07-16 ENCOUNTER — Encounter: Payer: Self-pay | Admitting: Podiatry

## 2013-07-16 ENCOUNTER — Ambulatory Visit: Payer: BC Managed Care – PPO | Admitting: Podiatry

## 2013-07-16 ENCOUNTER — Ambulatory Visit (INDEPENDENT_AMBULATORY_CARE_PROVIDER_SITE_OTHER): Payer: BC Managed Care – PPO | Admitting: Podiatry

## 2013-07-16 VITALS — BP 148/74 | HR 82 | Resp 16

## 2013-07-16 DIAGNOSIS — M722 Plantar fascial fibromatosis: Secondary | ICD-10-CM

## 2013-07-16 NOTE — Progress Notes (Signed)
Subjective:     Patient ID: Dominique Mcpherson, female   DOB: July 11, 1965, 48 y.o.   MRN: [id number]  HPI patient states my heel is still hurt but may be a little bit improved   Review of Systems     Objective:   Physical Exam Neurovascular status unchanged with pain in the plantar posterior left heel    Assessment:     Plantar fasciitis still present    Plan:     E. Pat is administered today 4.6 2500 shocks 15 frequency

## 2013-07-23 ENCOUNTER — Ambulatory Visit (INDEPENDENT_AMBULATORY_CARE_PROVIDER_SITE_OTHER): Payer: BC Managed Care – PPO | Admitting: Podiatry

## 2013-07-23 VITALS — BP 140/86 | HR 76 | Resp 12

## 2013-07-23 DIAGNOSIS — M722 Plantar fascial fibromatosis: Secondary | ICD-10-CM

## 2013-07-23 NOTE — Progress Notes (Signed)
Subjective:     Patient ID: Dominique Mcpherson, female   DOB: 27-May-1965, 48 y.o.   MRN: [id number]  HPI patient presents stating my heel is improving but still sore   Review of Systems     Objective:   Physical Exam Neurovascular status unchanged with pain in the plantar heel that is getting bette improvement plantar fatr    Assessment:     Improve plantar fasciitis    Plan:     Shockwave administered 5.0 2500 shocks tolerated well

## 2013-08-20 ENCOUNTER — Ambulatory Visit (INDEPENDENT_AMBULATORY_CARE_PROVIDER_SITE_OTHER): Payer: BC Managed Care – PPO | Admitting: Podiatry

## 2013-08-20 ENCOUNTER — Encounter: Payer: Self-pay | Admitting: Podiatry

## 2013-08-20 VITALS — BP 128/73 | HR 90 | Resp 12

## 2013-08-20 DIAGNOSIS — M722 Plantar fascial fibromatosis: Secondary | ICD-10-CM

## 2013-08-20 DIAGNOSIS — M7660 Achilles tendinitis, unspecified leg: Secondary | ICD-10-CM

## 2013-08-20 NOTE — Progress Notes (Signed)
Subjective:     Patient ID: Dominique Mcpherson, female   DOB: 1965-10-20, 48 y.o.   MRN: [id number]  HPI patient presents with diminished discomfort in the plantar heel but complaining of quite a bit of pain in the back of the heel left   Review of Systems     Objective:   Physical Exam Neurovascular status unchanged with no changes in health history and significant reduction of discomfort plantar left heel with quite a bit discomfort in the posterior Achilles at the muscle tendon junction    Assessment:     Achilles tendinitis left and improving plantar fasciitis left    Plan:     Shockwave performed plantar heel 2500 shocks 4.6 on intensity and education concerning Achilles tendinitis stretching exercises and heel lifts which were dispensed the patient reappoint in 3 weeks

## 2013-08-20 NOTE — Patient Instructions (Signed)

## 2013-08-23 ENCOUNTER — Other Ambulatory Visit: Payer: Self-pay | Admitting: Internal Medicine

## 2013-08-23 DIAGNOSIS — R6884 Jaw pain: Secondary | ICD-10-CM

## 2013-08-27 ENCOUNTER — Other Ambulatory Visit: Payer: BC Managed Care – PPO

## 2013-08-30 ENCOUNTER — Other Ambulatory Visit: Payer: BC Managed Care – PPO

## 2013-09-10 ENCOUNTER — Encounter: Payer: Self-pay | Admitting: Podiatry

## 2013-09-10 ENCOUNTER — Ambulatory Visit: Payer: BC Managed Care – PPO | Admitting: Podiatry

## 2013-09-10 ENCOUNTER — Ambulatory Visit (INDEPENDENT_AMBULATORY_CARE_PROVIDER_SITE_OTHER): Payer: BC Managed Care – PPO | Admitting: Podiatry

## 2013-09-10 VITALS — BP 126/66 | HR 84 | Resp 12

## 2013-09-10 DIAGNOSIS — M722 Plantar fascial fibromatosis: Secondary | ICD-10-CM

## 2013-09-10 NOTE — Progress Notes (Signed)
Subjective:     Patient ID: Dominique Mcpherson, female   DOB: [birth year], 48 y.o.   MRN: [id number]  HPI patient states my heel is doing much better with mild discomfort with prolonged standing or ambulation   Review of Systems     Objective:   Physical Exam Neurovascular status intact with discomfort of a mild nature noted in the plantar and posterior aspect left heel    Assessment:     Improved tendinitis fasciitis left    Plan:     Advised that she is improving and I have recommended a continuation of conservative care with possibility for further shockwave in future if necessary. Patient will wear supportive shoe and will be seen back to recheck

## 2013-11-21 ENCOUNTER — Encounter: Payer: Self-pay | Admitting: Podiatry

## 2013-11-21 ENCOUNTER — Ambulatory Visit (INDEPENDENT_AMBULATORY_CARE_PROVIDER_SITE_OTHER): Payer: BC Managed Care – PPO | Admitting: Podiatry

## 2013-11-21 DIAGNOSIS — M722 Plantar fascial fibromatosis: Secondary | ICD-10-CM

## 2013-11-21 DIAGNOSIS — M7660 Achilles tendinitis, unspecified leg: Secondary | ICD-10-CM

## 2013-11-21 MED ORDER — TRAMADOL HCL 50 MG PO TABS: 50.0000 mg | ORAL_TABLET | Freq: Three times a day (TID) | ORAL | Status: DC

## 2013-11-21 NOTE — Patient Instructions (Signed)

## 2013-11-22 NOTE — Progress Notes (Signed)
Subjective:     Patient ID: Dominique Mcpherson, female   DOB: [birth year], 48 y.o.   MRN: [id number]  HPI patient presents stating that her left heel has really been hurting and she's not sure what is going on with it   Review of Systems     Objective:   Physical Exam Neurovascular status intact with no plantar pain noted and posterior heel pain that is noted to be in the Achilles tendon itself    Assessment:     Achilles tendinitis left at the muscle tendon junction    Plan:     Explained condition and placed patient back in her air fracture walker and gave instructions on ice therapy and gradual stretching activities. Patient will be seen back and I did explain is a chance this tendon could rupture and she needs to be very careful

## 2013-12-01 ENCOUNTER — Other Ambulatory Visit: Payer: Self-pay | Admitting: Certified Nurse Midwife

## 2013-12-03 NOTE — Telephone Encounter (Signed)
Last AEX: 11/29/12 Last refill:Wellbutrin XL 11/29/12 #30, 12 ref Vitamin D 06/29/13 #12, 1 ref Current AEX:12/10/13  Please advise

## 2013-12-04 ENCOUNTER — Ambulatory Visit: Payer: BC Managed Care – PPO | Admitting: Certified Nurse Midwife

## 2013-12-10 ENCOUNTER — Ambulatory Visit (INDEPENDENT_AMBULATORY_CARE_PROVIDER_SITE_OTHER): Payer: BC Managed Care – PPO | Admitting: Certified Nurse Midwife

## 2013-12-10 ENCOUNTER — Encounter: Payer: Self-pay | Admitting: Certified Nurse Midwife

## 2013-12-10 VITALS — BP 110/68 | HR 80 | Resp 16 | Ht 62.25 in | Wt 348.0 lb

## 2013-12-10 DIAGNOSIS — Z0000 Encounter for general adult medical examination without abnormal findings: Secondary | ICD-10-CM

## 2013-12-10 DIAGNOSIS — E039 Hypothyroidism, unspecified: Secondary | ICD-10-CM

## 2013-12-10 DIAGNOSIS — Z124 Encounter for screening for malignant neoplasm of cervix: Secondary | ICD-10-CM

## 2013-12-10 DIAGNOSIS — N912 Amenorrhea, unspecified: Secondary | ICD-10-CM

## 2013-12-10 DIAGNOSIS — Z01419 Encounter for gynecological examination (general) (routine) without abnormal findings: Secondary | ICD-10-CM

## 2013-12-10 LAB — POCT URINALYSIS DIPSTICK
Bilirubin, UA: NEGATIVE
Blood, UA: NEGATIVE
Glucose, UA: NEGATIVE
Ketones, UA: NEGATIVE
Leukocytes, UA: NEGATIVE
Nitrite, UA: NEGATIVE
PH UA: 5
PROTEIN UA: NEGATIVE
Urobilinogen, UA: NEGATIVE

## 2013-12-10 LAB — POCT URINE PREGNANCY: Preg Test, Ur: NEGATIVE

## 2013-12-10 MED ORDER — NORETHINDRONE ACETATE 5 MG PO TABS: 5.0000 mg | ORAL_TABLET | Freq: Every day | ORAL | Status: DC

## 2013-12-10 NOTE — Progress Notes (Signed)
48 y.o. G0P0000 Single Caucasian Fe here for annual exam. Periods regular and light with Aygestin. "Feels much better on now". Thyroid medication working well, but occasional hot flashes Patient had Melanoma on upper right arm, removed this year, no other areas involved.. Work is still stressful, but has 3 new employees! Not sexually active. Sees PCP for aex, labs. No other health issues. Patient has loss 4 pounds in past month. Continues to work on.  Patient's last menstrual period was 10/31/2013.          Sexually active: No.  The current method of family planning is abstinence.    Exercising: No.  exercise Smoker:  no  Health Maintenance: Pap: 11-17-11 neg HPV HR neg MMG: 05-04-13 density category a: birads 1:neg Colonoscopy: 2015 negative 10 years per patient. BMD:   none TDaP:  2010 Labs: Poct urine-neg, upt-neg Self breast exam:done occ   reports that she has never smoked. She does not have any smokeless tobacco history on file. She reports that she drinks about one ounce of alcohol per week. She reports that she does not use illicit drugs.  Past Medical History  Diagnosis Date  . Acid reflux   . Anxiety   . Depression   . Knee osteoarthritis     with degenerative medial meniscus tear  . Meniscus tear   . Hypothyroidism     Past Surgical History  Procedure Laterality Date  . Cholecystectomy    . Tonsillectomy    . Foot surgery      bilateral plantar fascitis  . Hysteroscopy w/d&c N/A 01/31/2013    Procedure: DILATATION AND CURETTAGE  TRU CLEAR /HYSTEROSCOPY;  Surgeon: Azalia Bilis, MD;  Location: [facility];  Service: Gynecology;  Laterality: N/A;  . Melanoma excision Right 05/17/2013    Procedure: WIDE EXCISION MELANOMA RIGHT UPPER ARM;  Surgeon: Harl Bowie, MD;  Location: [facility];  Service: General;  Laterality: Right;  . Upper gi endoscopy  3/15    Current Outpatient Prescriptions  Medication Sig Dispense Refill  . acetaminophen (TYLENOL) 500  MG tablet Take 1,000 mg by mouth as needed.       Marland Kitchen buPROPion (WELLBUTRIN XL) 300 MG 24 hr tablet TAKE 1 TABLET BY MOUTH EVERY DAY  30 tablet  0  . Cholecalciferol (VITAMIN D3) 50000 UNITS CAPS Take 50,000 Units by mouth once a week.      . diclofenac (CATAFLAM) 50 MG tablet daily.      Marland Kitchen EPINEPHrine (EPIPEN) 0.3 mg/0.3 mL IJ SOAJ injection Inject 0.3 mg into the muscle as needed.      . fluticasone (FLONASE) 50 MCG/ACT nasal spray as needed.      Marland Kitchen ibuprofen (ADVIL,MOTRIN) 200 MG tablet Take 800 mg by mouth every 6 (six) hours as needed for pain.      Marland Kitchen ketoconazole (NIZORAL) 2 % cream Apply topically.      Marland Kitchen levothyroxine (SYNTHROID, LEVOTHROID) 75 MCG tablet TAKE 1 TABLET BY MOUTH EVERY DAY  90 tablet  3  . NEXIUM 40 MG capsule daily.      . norethindrone (AYGESTIN) 5 MG tablet TAKE 1 TABLET BY MOUTH ONCE DAILY ON DAYS 1-10 OF EACH MONTH  30 tablet  2  . tizanidine (ZANAFLEX) 2 MG capsule as needed.      . traMADol (ULTRAM) 50 MG tablet Take 1 tablet (50 mg total) by mouth 3 (three) times daily.  90 tablet  2   No current facility-administered medications for this visit.  Family History  Problem Relation Age of Onset  . Hypertension Brother   . Cancer Paternal Grandmother     colon  . Liver cancer Paternal Grandmother     ROS:  Pertinent items are noted in HPI.  Otherwise, a comprehensive ROS was negative.  Exam:   BP 110/68  Pulse 80  Resp 16  Ht 5' 2.25" (1.581 m)  Wt 348 lb (157.852 kg)  BMI 63.15 kg/m2  LMP 10/31/2013 Height: 5' 2.25" (158.1 cm)  Ht Readings from Last 3 Encounters:  12/10/13 5' 2.25" (1.581 m)  06/08/13 5\' 4"  (1.626 m)  05/17/13 5\' 4"  (1.626 m)    General appearance: alert, cooperative and appears stated age Morbid obesity Head: Normocephalic, without obvious abnormality, atraumatic Neck: no adenopathy, supple, symmetrical, trachea midline and thyroid normal to inspection and palpation and non-palpable Lungs: clear to auscultation  bilaterally Breasts: normal appearance, no masses or tenderness, No nipple retraction or dimpling, No nipple discharge or bleeding, No axillary or supraclavicular adenopathy Heart: regular rate and rhythm Abdomen: soft, non-tender; no masses,  no organomegaly Extremities: extremities normal, atraumatic, no cyanosis or edema Skin: Skin color, texture, turgor normal. No rashes or lesions Lymph nodes: Cervical, supraclavicular, and axillary nodes normal. No abnormal inguinal nodes palpated Neurologic: Grossly normal   Pelvic: External genitalia:  no lesions              Urethra:  normal appearing urethra with no masses, tenderness or lesions              Bartholin's and Skene's: normal                 Vagina: normal appearing vagina with normal color and discharge, no lesions              Cervix: normal, non tender              Pap taken: Yes.   Bimanual Exam:  Uterus:  palpable, mid position, no large masses, difficult due to body habitus              Adnexa: not palpable, no large masses noted, due body habitus               Rectovaginal: Confirms               Anus:  normal sphincter tone, no lesions  A:  Well Woman with normal exam  Not Sexually active  DUB Aygestin working well 1-10 day per month, aware this is not contraception, will use condoms if needed  Hypothyroid stable medication  Anxiety/depression Wellbutrin working well desires continuance.  Morbid obesity with limited pelvic exam, PUS done in 2014 normal here    P:   Reviewed health and wellness pertinent to exam  Rx Aygestin see order, advise if bleeding and spotting occur again  Lab:TSH, will renew once TSH is in, Hgb. A1-c  Rx Wellbutrin see order, patient will advise if not working or problems with  Aware of limited pelvic exam, may need PUS again, but usually every 2 years. Will advise if needed.  Pap smear taken today with HPV reflex  Mammogram yearly discussed Discussed need for regular exercise, and continue  to work on weight loss. Declines any other information regarding weight issues. Reviewed calcium and Vitamin D needs.  return annually or prn  An After Visit Summary was printed and given to the patient.

## 2013-12-10 NOTE — Patient Instructions (Signed)
EXERCISE AND DIET:  We recommended that you start or continue a regular exercise program for good health. Regular exercise means any activity that makes your heart beat faster and makes you sweat.  We recommend exercising at least 30 minutes per day at least 3 days a week, preferably 4 or 5.  We also recommend a diet low in fat and sugar.  Inactivity, poor dietary choices and obesity can cause diabetes, heart attack, stroke, and kidney damage, among others.    ALCOHOL AND SMOKING:  Women should limit their alcohol intake to no more than 7 drinks/beers/glasses of wine (combined, not each!) per week. Moderation of alcohol intake to this level decreases your risk of breast cancer and liver damage. And of course, no recreational drugs are part of a healthy lifestyle.  And absolutely no smoking or even second hand smoke. Most people know smoking can cause heart and lung diseases, but did you know it also contributes to weakening of your bones? Aging of your skin?  Yellowing of your teeth and nails?  CALCIUM AND VITAMIN D:  Adequate intake of calcium and Vitamin D are recommended.  The recommendations for exact amounts of these supplements seem to change often, but generally speaking 600 mg of calcium (either carbonate or citrate) and 800 units of Vitamin D per day seems prudent. Certain women may benefit from higher intake of Vitamin D.  If you are among these women, your doctor will have told you during your visit.    PAP SMEARS:  Pap smears, to check for cervical cancer or precancers,  have traditionally been done yearly, although recent scientific advances have shown that most women can have pap smears less often.  However, every woman still should have a physical exam from her gynecologist every year. It will include a breast check, inspection of the vulva and vagina to check for abnormal growths or skin changes, a visual exam of the cervix, and then an exam to evaluate the size and shape of the uterus and  ovaries.  And after 46 years of age, a rectal exam is indicated to check for rectal cancers. We will also provide age appropriate advice regarding health maintenance, like when you should have certain vaccines, screening for sexually transmitted diseases, bone density testing, colonoscopy, mammograms, etc.   MAMMOGRAMS:  All women over 40 years old should have a yearly mammogram. Many facilities now offer a "3D" mammogram, which may cost around $50 extra out of pocket. If possible,  we recommend you accept the option to have the 3D mammogram performed.  It both reduces the number of women who will be called back for extra views which then turn out to be normal, and it is better than the routine mammogram at detecting truly abnormal areas.    COLONOSCOPY:  Colonoscopy to screen for colon cancer is recommended for all women at age 50.  We know, you hate the idea of the prep.  We agree, BUT, having colon cancer and not knowing it is worse!!  Colon cancer so often starts as a polyp that can be seen and removed at colonscopy, which can quite literally save your life!  And if your first colonoscopy is normal and you have no family history of colon cancer, most women don't have to have it again for 10 years.  Once every ten years, you can do something that may end up saving your life, right?  We will be happy to help you get it scheduled when you are ready.    Be sure to check your insurance coverage so you understand how much it will cost.  It may be covered as a preventative service at no cost, but you should check your particular policy.     Perimenopause Perimenopause is the time when your body begins to move into the menopause (no menstrual period for 12 straight months). It is a natural process. Perimenopause can begin 2-8 years before the menopause and usually lasts for 1 year after the menopause. During this time, your ovaries may or may not produce an egg. The ovaries vary in their production of estrogen and  progesterone hormones each month. This can cause irregular menstrual periods, difficulty getting pregnant, vaginal bleeding between periods, and uncomfortable symptoms. CAUSES  Irregular production of the ovarian hormones, estrogen and progesterone, and not ovulating every month.  Other causes include:  Tumor of the pituitary gland in the brain.  Medical disease that affects the ovaries.  Radiation treatment.  Chemotherapy.  Unknown causes.  Heavy smoking and excessive alcohol intake can bring on perimenopause sooner. SIGNS AND SYMPTOMS   Hot flashes.  Night sweats.  Irregular menstrual periods.  Decreased sex drive.  Vaginal dryness.  Headaches.  Mood swings.  Depression.  Memory problems.  Irritability.  Tiredness.  Weight gain.  Trouble getting pregnant.  The beginning of losing bone cells (osteoporosis).  The beginning of hardening of the arteries (atherosclerosis). DIAGNOSIS  Your health care provider will make a diagnosis by analyzing your age, menstrual history, and symptoms. He or she will do a physical exam and note any changes in your body, especially your female organs. Female hormone tests may or may not be helpful depending on the amount of female hormones you produce and when you produce them. However, other hormone tests may be helpful to rule out other problems. TREATMENT  In some cases, no treatment is needed. The decision on whether treatment is necessary during the perimenopause should be made by you and your health care provider based on how the symptoms are affecting you and your lifestyle. Various treatments are available, such as:  Treating individual symptoms with a specific medicine for that symptom.  Herbal medicines that can help specific symptoms.  Counseling.  Group therapy. HOME CARE INSTRUCTIONS   Keep track of your menstrual periods (when they occur, how heavy they are, how long between periods, and how long they last) as  well as your symptoms and when they started.  Only take over-the-counter or prescription medicines as directed by your health care provider.  Sleep and rest.  Exercise.  Eat a diet that contains calcium (good for your bones) and soy (acts like the estrogen hormone).  Do not smoke.  Avoid alcoholic beverages.  Take vitamin supplements as recommended by your health care provider. Taking vitamin E may help in certain cases.  Take calcium and vitamin D supplements to help prevent bone loss.  Group therapy is sometimes helpful.  Acupuncture may help in some cases. SEEK MEDICAL CARE IF:   You have questions about any symptoms you are having.  You need a referral to a specialist (gynecologist, psychiatrist, or psychologist). SEEK IMMEDIATE MEDICAL CARE IF:   You have vaginal bleeding.  Your period lasts longer than 8 days.  Your periods are recurring sooner than 21 days.  You have bleeding after intercourse.  You have severe depression.  You have pain when you urinate.  You have severe headaches.  You have vision problems. Document Released: 05/27/2004 Document Revised: 02/07/2013 Document Reviewed: 11/16/2012 Exit[employer]  Patient Information 2015 [employer]. This information is not intended to replace advice given to you by your health care provider. Make sure you discuss any questions you have with your health care provider.  

## 2013-12-11 ENCOUNTER — Other Ambulatory Visit: Payer: Self-pay | Admitting: Certified Nurse Midwife

## 2013-12-11 LAB — HEMOGLOBIN A1C
Hgb A1c MFr Bld: 5.6 % (ref <5.7)
Mean Plasma Glucose: 114 mg/dL (ref <117)

## 2013-12-11 LAB — TSH: TSH: 2.767 u[IU]/mL (ref 0.350–4.500)

## 2013-12-11 MED ORDER — LEVOTHYROXINE SODIUM 75 MCG PO TABS: 75.0000 ug | ORAL_TABLET | Freq: Every day | ORAL | Status: DC

## 2013-12-12 NOTE — Progress Notes (Signed)
As pathology was benign and cycles are normal, ok to follow conservatively.  No PUS needed this year.  Reviewed personally.  Felipa Emory, MD.

## 2013-12-13 LAB — IPS PAP TEST WITH REFLEX TO HPV

## 2014-01-06 ENCOUNTER — Other Ambulatory Visit: Payer: Self-pay | Admitting: Gynecology

## 2014-01-08 NOTE — Telephone Encounter (Signed)
Last AEX; 12/10/13 Last refill:12/03/13 #30 X 0 Current AEX:NS  Please advise

## 2014-01-10 ENCOUNTER — Other Ambulatory Visit: Payer: Self-pay

## 2014-01-10 NOTE — Telephone Encounter (Signed)
Refill request already submitted on 01/06/14. Please disregard

## 2014-02-06 ENCOUNTER — Telehealth: Payer: Self-pay | Admitting: *Deleted

## 2014-02-06 NOTE — Telephone Encounter (Signed)
I'm a patient of Dr. Paulla Dolly.  I have a strange question.  My orthotics that I got through you guys started to cause a problem with my brand new shoes.  I guess they are rubbing the sides of all my [employer], you know sneakers I have for leg support and foot support.  Does it mean they may be worn out?  Do I need to have them re-made or trimmed off?  What would be the best advice for that?  Thank you.   I returned her call and recommended she schedule an appointment with Dr. Paulla Dolly so he can examine her orthotics and make recommendations.  She said she would call back to schedule because she did not have her calendar with her.

## 2014-02-15 ENCOUNTER — Other Ambulatory Visit: Payer: Self-pay

## 2014-02-21 ENCOUNTER — Ambulatory Visit
Admission: RE | Admit: 2014-02-21 | Discharge: 2014-02-21 | Disposition: A | Discharge status: Home / Self Care | Payer: BC Managed Care – PPO | Source: Ambulatory Visit | Attending: Internal Medicine | Admitting: Internal Medicine

## 2014-02-21 ENCOUNTER — Other Ambulatory Visit: Payer: Self-pay | Admitting: Internal Medicine

## 2014-02-21 DIAGNOSIS — R0781 Pleurodynia: Secondary | ICD-10-CM

## 2014-04-18 ENCOUNTER — Other Ambulatory Visit: Payer: Self-pay | Admitting: *Deleted

## 2014-04-18 NOTE — Telephone Encounter (Signed)
Left Message To Call Back  

## 2014-04-18 NOTE — Telephone Encounter (Signed)
Medication refill request: Vitamin D 50,000  Last AEX:  12/10/13 with Ms. Debbie Next AEX: No AEX scheduled for 2016 Last MMG (if hormonal medication request): N/A Refill authorized: 12/1 rfs, please advise.

## 2014-04-18 NOTE — Telephone Encounter (Signed)
Have patient start on 1000 IU daily OTC Vitamin D3 now

## 2014-04-22 NOTE — Telephone Encounter (Signed)
Patient notified aware to take Vitamin D 1000 D3 daily.

## 2014-08-25 ENCOUNTER — Other Ambulatory Visit: Payer: Self-pay | Admitting: Certified Nurse Midwife

## 2014-08-26 ENCOUNTER — Other Ambulatory Visit: Payer: Self-pay | Admitting: *Deleted

## 2014-08-26 NOTE — Telephone Encounter (Signed)
Will refill only to year from aex

## 2014-08-26 NOTE — Telephone Encounter (Signed)
Medication refill request: Wellbutrin XL 300 mg  Last AEX: 12/10/13 with DL  Next AEX: No AEX scheduled  Last MMG (if hormonal medication request): N/A Refill authorized: #30/4 rfs, please advise.

## 2014-09-01 ENCOUNTER — Other Ambulatory Visit: Payer: Self-pay | Admitting: Certified Nurse Midwife

## 2014-09-02 NOTE — Telephone Encounter (Signed)
Medication refill request: Aygestin  Last AEX:  12/10/13 DL Next AEX: not scheduled  Last MMG (if hormonal medication request): 05/04/13 BIRADS1:Neg Refill authorized: 12/10/13 #30tabs/ 2R. Today please advise.   LM for pt to call back re: Refill and MMG. Routed to [facility]

## 2014-11-13 ENCOUNTER — Telehealth: Payer: Self-pay | Admitting: Certified Nurse Midwife

## 2014-11-13 NOTE — Telephone Encounter (Signed)
Spoke with patient. Patient states that she did not have a cycle in May or June. Patient started her cycle today and states "It is dark than normal but is like a normal period." Denies any heavy bleeding or discomfort. Advised patient will need to monitor cycle. If bleeding increases or is extended longer than 7 days will need to give our office a call. Advised will need to monitor cycles. If she continues to miss 3 consecutive cycles will need to be seen in office for further evaluation. Patient is agreeable. Aex is scheduled for 8/11/016 at 8am with Regina Eck CNM.  Routing to provider for final review. Patient agreeable to disposition. Will close encounter.   Patient aware provider will review message and nurse will return call if any additional advice or change of disposition.

## 2014-11-13 NOTE — Telephone Encounter (Signed)
Patient wants to talk with the nurse no information given.  °

## 2014-12-12 ENCOUNTER — Ambulatory Visit: Payer: Self-pay | Admitting: Certified Nurse Midwife

## 2015-01-24 ENCOUNTER — Encounter: Payer: Self-pay | Admitting: Certified Nurse Midwife

## 2015-01-24 ENCOUNTER — Ambulatory Visit (INDEPENDENT_AMBULATORY_CARE_PROVIDER_SITE_OTHER): Payer: BLUE CROSS/BLUE SHIELD | Admitting: Certified Nurse Midwife

## 2015-01-24 VITALS — BP 118/72 | HR 72 | Resp 20 | Ht 62.25 in | Wt 345.0 lb

## 2015-01-24 DIAGNOSIS — N938 Other specified abnormal uterine and vaginal bleeding: Secondary | ICD-10-CM | POA: Diagnosis not present

## 2015-01-24 DIAGNOSIS — Z01419 Encounter for gynecological examination (general) (routine) without abnormal findings: Secondary | ICD-10-CM

## 2015-01-24 MED ORDER — NORETHINDRONE ACETATE 5 MG PO TABS: ORAL_TABLET | ORAL | Status: DC

## 2015-01-24 NOTE — Progress Notes (Signed)
Reviewed personally.  M. Suzanne Miller, MD.  

## 2015-01-24 NOTE — Patient Instructions (Signed)

## 2015-01-24 NOTE — Addendum Note (Signed)
Addended by: Regina Eck on: 01/24/2015 10:41 AM   Modules accepted: Orders, SmartSet

## 2015-01-24 NOTE — Progress Notes (Addendum)
49 y.o. G0P0000 Single  Caucasian Fe here for annual exam. Periods sporadic with Aygestin use. No really heavy periods, mainly normal to faint. Not sexually active in the past year. Sees Dr.Jaralla yearly for labs/aex. Had severe GI illness in 7/16, which was treated, lasted 2 weeks. Recovered without problems. Has left foot injury today in walking boot. Under Orthopedic care. Considering weight loss program, but not surgery. wellbutrin working well for depression, desires continuance.No other health issues today. Stress is less at work now!   Patient's last menstrual period was 01/17/2015.          Sexually active: No.  The current method of family planning is abstinence.    Exercising: No.  exercise Smoker:  no  Health Maintenance: Pap: 12-10-13 neg MMG: 05-04-13 category a density,birads 1:neg due Colonoscopy:  2015 neg per patient,f/u 55yrs BMD:   none TDaP:  2010 Labs: none  Self breast exam: done occ   reports that she has never smoked. She does not have any smokeless tobacco history on file. She reports that she drinks about 1.2 oz of alcohol per week. She reports that she does not use illicit drugs.  Past Medical History  Diagnosis Date  . Acid reflux   . Anxiety   . Depression   . Knee osteoarthritis     with degenerative medial meniscus tear  . Meniscus tear   . Hypothyroidism     Past Surgical History  Procedure Laterality Date  . Cholecystectomy    . Tonsillectomy    . Foot surgery      bilateral plantar fascitis  . Hysteroscopy w/d&c N/A 01/31/2013    Procedure: DILATATION AND CURETTAGE  TRU CLEAR /HYSTEROSCOPY;  Surgeon: Azalia Bilis, MD;  Location: [facility];  Service: Gynecology;  Laterality: N/A;  . Melanoma excision Right 05/17/2013    Procedure: WIDE EXCISION MELANOMA RIGHT UPPER ARM;  Surgeon: Harl Bowie, MD;  Location: [facility];  Service: General;  Laterality: Right;  . Upper gi endoscopy  3/15    Current Outpatient Prescriptions   Medication Sig Dispense Refill  . acetaminophen (TYLENOL) 500 MG tablet Take 1,000 mg by mouth as needed.     Marland Kitchen buPROPion (WELLBUTRIN XL) 300 MG 24 hr tablet TAKE 1 TABLET BY MOUTH EVERY DAY 30 tablet 4  . diclofenac (CATAFLAM) 50 MG tablet daily.    . fluticasone (FLONASE) 50 MCG/ACT nasal spray as needed.    Marland Kitchen ibuprofen (ADVIL,MOTRIN) 200 MG tablet Take 800 mg by mouth every 6 (six) hours as needed for pain.    Marland Kitchen ketoconazole (NIZORAL) 2 % cream Apply topically.    Marland Kitchen levothyroxine (SYNTHROID, LEVOTHROID) 75 MCG tablet Take 1 tablet (75 mcg total) by mouth daily before breakfast. 90 tablet 4  . NEXIUM 40 MG capsule daily.    . norethindrone (AYGESTIN) 5 MG tablet TAKE 1 TABLET BY MOUTH DAILY (Patient taking differently: TAKE 1 TABLET BY MOUTH DAYS 1-10) 30 tablet 0  . tizanidine (ZANAFLEX) 2 MG capsule as needed.    . traMADol (ULTRAM) 50 MG tablet Take 1 tablet (50 mg total) by mouth 3 (three) times daily. 90 tablet 2  . EPINEPHrine (EPIPEN) 0.3 mg/0.3 mL IJ SOAJ injection Inject 0.3 mg into the muscle as needed.     No current facility-administered medications for this visit.    Family History  Problem Relation Age of Onset  . Hypertension Brother   . Cancer Paternal Grandmother     colon  . Liver cancer  Paternal Grandmother     ROS:  Pertinent items are noted in HPI.  Otherwise, a comprehensive ROS was negative.  Exam:   BP 118/72 mmHg  Pulse 72  Resp 20  Ht 5' 2.25" (1.581 m)  Wt 345 lb (156.491 kg)  BMI 62.61 kg/m2  LMP 01/17/2015 Height: 5' 2.25" (158.1 cm) Ht Readings from Last 3 Encounters:  01/24/15 5' 2.25" (1.581 m)  12/10/13 5' 2.25" (1.581 m)  06/08/13 5\' 4"  (1.626 m)    General appearance: alert, cooperative and appears stated age Head: Normocephalic, without obvious abnormality, atraumatic Neck: no adenopathy, supple, symmetrical, trachea midline and thyroid normal to inspection and palpation Lungs: clear to auscultation bilaterally Breasts: normal  appearance, no masses or tenderness, No nipple retraction or dimpling, No nipple discharge or bleeding, No axillary or supraclavicular adenopathy Heart: regular rate and rhythm Abdomen: soft, non-tender; no masses,  no organomegaly Extremities: extremities normal, atraumatic, no cyanosis or edema Skin: Skin color, texture, turgor normal. No rashes or lesions Lymph nodes: Cervical, supraclavicular, and axillary nodes normal. No abnormal inguinal nodes palpated Neurologic: Grossly normal   Pelvic: External genitalia:  no lesions              Urethra:  normal appearing urethra with no masses, tenderness or lesions              Bartholin's and Skene's: normal                 Vagina: normal appearing vagina with normal color and discharge, no lesions              Cervix: normal,non tender, no lesions              Pap taken: No. Bimanual Exam:  Uterus:  normal and no large masses noted, limited by body habitus              Adnexa: no mass, fullness, tenderness and adnexa not palpated bilaterally, no large masses or tenderness, limited by body habitus               Rectovaginal: Confirms               Anus:  normal sphincter tone, no lesions  Chaperone present: yes  A:  Well Woman with normal exam  Limited pelvic exam due to morbid obesity  Anxiety/depressions Wellbutrin working well, desires continuance   Hypothyroid Synthroid working well, will do lab today  Left foot injury under evaluation    P:   Reviewed health and wellness pertinent to exam  Discussed limited exam due obesity, patient aware. Recommend PUS this year for evaluation. Patient agreeable( has been 2 years since last one). Patient aware she will be called with insurance info and scheduled  Encouraged to consider Bariatric clinic for weight loss, no surgery. Patient will think about. Discussed risk with continued feet and leg issues, due to weight.  Rx Welbutrin see order  Rx Synthroid see order  Lab TSH  Continue MD  follow up as indicated  Pap smear as above not taken   counseled on breast self exam, mammography screening, adequate intake of calcium and vitamin D, diet and exercise  return annually or prn  An After Visit Summary was printed and given to the patient.

## 2015-01-27 ENCOUNTER — Telehealth: Payer: Self-pay | Admitting: Certified Nurse Midwife

## 2015-01-27 NOTE — Telephone Encounter (Signed)
Spoke with patient. Verified benefits for PUS. Patient understands and agreeable. Patient scheduled for 10/13 per patient request. Patient agreeable to date/time with dr Sabra Heck. Patient aware of 72 hour cancellation policy with $203 fee. Patient agreeable to arrival date/time.

## 2015-02-05 ENCOUNTER — Other Ambulatory Visit: Payer: Self-pay | Admitting: Certified Nurse Midwife

## 2015-02-05 NOTE — Addendum Note (Signed)
Addended by: Regina Eck on: 02/05/2015 11:32 AM   Modules accepted: Miquel Dunn

## 2015-02-05 NOTE — Telephone Encounter (Signed)
Medication refill request: Wellbutrin and Synthroid Last AEX:  12/12/14 with DL Next AEX: 01/30/16 with DL Last MMG (if hormonal medication request):  Refill authorized: please advise

## 2015-02-12 ENCOUNTER — Telehealth: Payer: Self-pay | Admitting: Obstetrics & Gynecology

## 2015-02-12 NOTE — Telephone Encounter (Signed)
Spoke with patient. She started her cycle today and is having a normal cycle. She is advised that the choice is up to her to keep appointment for evaluation, but our recommendation is to keep appointment. Patient states she is not comfortable having transvaginal ultrasound during her cycle and requests to reschedule. Appointment is rescheduled for 02/20/15 with Dr. Sabra Heck. Patient agreeable.  Routing to provider for final review. Patient agreeable to disposition. Will close encounter.

## 2015-02-12 NOTE — Telephone Encounter (Signed)
Patient has a pus/consult appointment tomorrow and has started her cycle. Patient is asking if she needs to reschedule.

## 2015-02-13 ENCOUNTER — Other Ambulatory Visit: Payer: BLUE CROSS/BLUE SHIELD | Admitting: Obstetrics & Gynecology

## 2015-02-13 ENCOUNTER — Other Ambulatory Visit: Payer: BLUE CROSS/BLUE SHIELD

## 2015-02-20 ENCOUNTER — Ambulatory Visit (INDEPENDENT_AMBULATORY_CARE_PROVIDER_SITE_OTHER): Payer: BLUE CROSS/BLUE SHIELD

## 2015-02-20 ENCOUNTER — Encounter: Payer: Self-pay | Admitting: Obstetrics & Gynecology

## 2015-02-20 ENCOUNTER — Ambulatory Visit (INDEPENDENT_AMBULATORY_CARE_PROVIDER_SITE_OTHER): Payer: BLUE CROSS/BLUE SHIELD | Admitting: Obstetrics & Gynecology

## 2015-02-20 VITALS — BP 136/74 | HR 72 | Resp 20

## 2015-02-20 DIAGNOSIS — N970 Female infertility associated with anovulation: Secondary | ICD-10-CM

## 2015-02-20 NOTE — Progress Notes (Signed)
49 y.o. G0 Singlefemale here for a pelvic ultrasound due to morbid obesity.  Pt reports cycles this year are changing.  She skipped almost three months in the summer.  She called to let us know and then started her cycle.  She didn't end up needing provera for this.  Cycle was not heavy or long.  Since then, cycles are every four to five weeks but are getting lighter and shorter.  Denies pain.  BMI 62.  Patient's last menstrual period was 02/11/2015.  Sexually active:  no  Contraception: abstinence  FINDINGS: UTERUS: 5.0 x 4.1 x 3.4cm without fibroids EMS: 4.41mm ADNEXA:   Left ovary 1.1 x 1.0 x 0.8cm   Right ovary 1.7 x 1.0 x 1.0cm CUL DE SAC: no free fluid  Reviewed images with pt.  H/O hysteroscopic poliyp reseciton 10/14.  Endometrium then was 29mm.  Pathology negative.  D/W pt importance of provera withdrawal bleeding if she skips cycle for more than 90 days.  Voices clear understanding.    Assessment:  Oligo ovulation, morbid obesity Plan: Follow up for AEX as scheduled with Johny Shock, CNM.  ~15 minutes spent with patient >50% of time was in face to face discussion of above.

## 2015-02-20 NOTE — Progress Notes (Signed)
reviewed

## 2015-07-06 ENCOUNTER — Other Ambulatory Visit: Payer: Self-pay | Admitting: Certified Nurse Midwife

## 2015-07-06 ENCOUNTER — Encounter: Payer: Self-pay | Admitting: Certified Nurse Midwife

## 2015-07-07 ENCOUNTER — Telehealth: Payer: Self-pay

## 2015-07-07 NOTE — Telephone Encounter (Signed)
Telephone encounter created to discuss mychart encounter with Melvia Heaps CNM.

## 2015-07-07 NOTE — Telephone Encounter (Signed)
Medication refill request: Wellbutrin  Last AEX:  01-24-15 Next AEX: 01-30-16 Last MMG (if hormonal medication request): 05-04-13 Refill authorized: please advise

## 2015-07-07 NOTE — Telephone Encounter (Signed)
She will need to see her PCP now to change her meds due to the dose she is on.

## 2015-07-07 NOTE — Telephone Encounter (Signed)
Non-Urgent Medical Question  Message [id number]   From  [name]   To  Regina Eck, CNM   Sent  07/06/2015 8:10 AM     Hello Dominique Mcpherson -   I believe the bupropion 300 is no longer effective for me- having taken it for many years. Is this something you are able to adjust for me?    I am having challenges similar to those when the medication was originally prescribed.    Thank you -   Dominique Mcpherson      Responsible Party    Pool - [facility] No one has taken responsibility for this message.     No actions have been taken on this message.     Patient states that she has started to have trouble staying focused at work, completing tasks, and feelings more down and gloomy. "This is the same pattern I had before when I was originally started on this medication years ago."  Patient is requesting further advice on medication adjustment for symptoms. Advised I will speak with Melvia Heaps CNM and return call with additional recommendations. She is agreeable. Aware Melvia Heaps CNM is out of the office today and will return tomorrow morning.

## 2015-07-07 NOTE — Telephone Encounter (Signed)
She has PCP and I feel now she needs to work with PCP since she has been on 300 mg and now not working .

## 2015-07-08 NOTE — Telephone Encounter (Signed)
Spoke with patient. Advised of message as seen below from [place]. Patient states that her primary care will be leaving the practice in April and she does not feel this will be able to be performed with her PCP. Advised PCP will be able to asess her needs and make changes to her current prescription or start her on a new prescription. Recommended that she contact her PCP office and schedule an appointment with her current provider or if there is another provider she feels comfortable seeing at that practice she may see them so she can establish a new PCP. Offered to refer her to a new PCP if needed. She declines. Again states that she does not feel her PCP will help her with this as "they did not prescribe the medication in the first place." Advised it is important that her PCP manage her medication as she may need to try alternative medications that we are unable to prescribe. The patient states she will contact her PCP and disconnects the phone call.  Routing to provider for final review. Patient agreeable to disposition. Will close encounter.

## 2015-07-10 ENCOUNTER — Encounter: Payer: Self-pay | Admitting: Certified Nurse Midwife

## 2015-07-10 ENCOUNTER — Other Ambulatory Visit: Payer: Self-pay | Admitting: Certified Nurse Midwife

## 2015-07-10 DIAGNOSIS — F418 Other specified anxiety disorders: Secondary | ICD-10-CM

## 2015-07-10 MED ORDER — BUPROPION HCL ER (XL) 300 MG PO TB24: 300.0000 mg | ORAL_TABLET | Freq: Every day | ORAL | Status: DC

## 2015-07-24 ENCOUNTER — Telehealth: Payer: Self-pay

## 2015-07-24 NOTE — Telephone Encounter (Signed)
Lmtcb. Pt has not read her email yet from Melvia Heaps, CNM regarding medication.

## 2015-07-24 NOTE — Telephone Encounter (Signed)
Lmtcb. Pt has not read email yet regarding medication

## 2015-07-25 NOTE — Telephone Encounter (Signed)
Pt notified. Per D leonard since patient has already picked up her 300mg  dosage of wellbutrin & it is hard for her to cut it in half that patient is to take one tablet daily but take it at a different time than she normally does & see if that helps. If it doesn't. Pt is to call us back & a rx for 150mg  twice daily will be sent in for her. Pt agrees.

## 2015-08-22 DIAGNOSIS — D225 Melanocytic nevi of trunk: Secondary | ICD-10-CM | POA: Diagnosis not present

## 2015-08-22 DIAGNOSIS — L821 Other seborrheic keratosis: Secondary | ICD-10-CM | POA: Diagnosis not present

## 2015-08-22 DIAGNOSIS — L812 Freckles: Secondary | ICD-10-CM | POA: Diagnosis not present

## 2015-08-22 DIAGNOSIS — Z85820 Personal history of malignant melanoma of skin: Secondary | ICD-10-CM | POA: Diagnosis not present

## 2015-11-03 ENCOUNTER — Other Ambulatory Visit: Payer: Self-pay | Admitting: Certified Nurse Midwife

## 2015-11-03 DIAGNOSIS — Z1231 Encounter for screening mammogram for malignant neoplasm of breast: Secondary | ICD-10-CM

## 2015-11-04 ENCOUNTER — Other Ambulatory Visit: Payer: Self-pay | Admitting: Certified Nurse Midwife

## 2015-11-05 NOTE — Telephone Encounter (Signed)
I do not see a note from [place]- It does not appear that this has been refilled.

## 2015-11-05 NOTE — Telephone Encounter (Signed)
I saw a note to Patty regarding has this been done

## 2015-11-05 NOTE — Telephone Encounter (Signed)
Medication refill request: Wellbutrin   Last AEX:  01-24-15  Next AEX: 01-30-16 Last MMG (if hormonal medication request): 05-04-13 WNL Scheduled for MMG 11-19-15  Refill authorized: please advise

## 2015-11-12 ENCOUNTER — Ambulatory Visit
Admission: RE | Admit: 2015-11-12 | Discharge: 2015-11-12 | Disposition: A | Discharge status: Home / Self Care | Payer: BLUE CROSS/BLUE SHIELD | Source: Ambulatory Visit | Attending: Internal Medicine | Admitting: Internal Medicine

## 2015-11-12 ENCOUNTER — Other Ambulatory Visit: Payer: Self-pay | Admitting: Internal Medicine

## 2015-11-12 DIAGNOSIS — M1611 Unilateral primary osteoarthritis, right hip: Secondary | ICD-10-CM | POA: Diagnosis not present

## 2015-11-12 DIAGNOSIS — K219 Gastro-esophageal reflux disease without esophagitis: Secondary | ICD-10-CM | POA: Diagnosis not present

## 2015-11-12 DIAGNOSIS — M25551 Pain in right hip: Secondary | ICD-10-CM

## 2015-11-12 DIAGNOSIS — E559 Vitamin D deficiency, unspecified: Secondary | ICD-10-CM | POA: Diagnosis not present

## 2015-11-19 ENCOUNTER — Ambulatory Visit
Admission: RE | Admit: 2015-11-19 | Discharge: 2015-11-19 | Disposition: A | Discharge status: Home / Self Care | Payer: BLUE CROSS/BLUE SHIELD | Source: Ambulatory Visit | Attending: Certified Nurse Midwife | Admitting: Certified Nurse Midwife

## 2015-11-19 DIAGNOSIS — Z1231 Encounter for screening mammogram for malignant neoplasm of breast: Secondary | ICD-10-CM | POA: Diagnosis not present

## 2015-12-10 DIAGNOSIS — Z23 Encounter for immunization: Secondary | ICD-10-CM | POA: Diagnosis not present

## 2015-12-10 DIAGNOSIS — E559 Vitamin D deficiency, unspecified: Secondary | ICD-10-CM | POA: Diagnosis not present

## 2015-12-10 DIAGNOSIS — M1990 Unspecified osteoarthritis, unspecified site: Secondary | ICD-10-CM | POA: Diagnosis not present

## 2016-01-26 ENCOUNTER — Ambulatory Visit (INDEPENDENT_AMBULATORY_CARE_PROVIDER_SITE_OTHER): Payer: BLUE CROSS/BLUE SHIELD | Admitting: Podiatry

## 2016-01-26 ENCOUNTER — Encounter: Payer: Self-pay | Admitting: Podiatry

## 2016-01-26 DIAGNOSIS — Q828 Other specified congenital malformations of skin: Secondary | ICD-10-CM | POA: Diagnosis not present

## 2016-01-26 DIAGNOSIS — M779 Enthesopathy, unspecified: Secondary | ICD-10-CM

## 2016-01-26 MED ORDER — TRIAMCINOLONE ACETONIDE 10 MG/ML IJ SUSP
10.0000 mg | Freq: Once | INTRAMUSCULAR | Status: AC
  Administered 2016-01-26: 10 mg

## 2016-01-26 NOTE — Progress Notes (Signed)
Subjective:     Patient ID: Dominique Mcpherson, female   DOB: 10-06-1965, 50 y.o.   MRN: [id number]  HPI patient presents with lesion and pain base of fifth metatarsal left with fluid buildup with several keratotic lesions that are painful   Review of Systems     Objective:   Physical Exam Neurovascular status intact with marked obesity which is a complicating factor with inflammatory changes base of fifth metatarsal left with fluid buildup and lesion formation    Assessment:     Inflammatory capsulitis base of fifth MPJ left with keratotic lesion formation    Plan:     Reviewed conditions and recommended injection base of fifth met which was accomplished and debridement of lesion fifth metatarsal and underneath the left foot

## 2016-01-30 ENCOUNTER — Ambulatory Visit: Payer: BLUE CROSS/BLUE SHIELD | Admitting: Certified Nurse Midwife

## 2016-01-31 ENCOUNTER — Other Ambulatory Visit: Payer: Self-pay | Admitting: Certified Nurse Midwife

## 2016-01-31 DIAGNOSIS — N938 Other specified abnormal uterine and vaginal bleeding: Secondary | ICD-10-CM

## 2016-02-02 NOTE — Telephone Encounter (Signed)
Medication refill request: Norethindrone Last AEX:  01/24/15 DL Next AEX: Not scheduled  Last MMG (if hormonal medication request): 11/19/15 BIRADS1 Refill authorized: 01/24/15 #30 6R. Please advise. Thank you.   Medication refill request: Levothyroxine Last AEX:  01/24/15 DL Next AEX: Not scheduled Last MMG (if hormonal medication request): 11/19/15 BIRADS1  Refill authorized: 02/05/15 #90 3R. Please advise. Thank you.

## 2016-02-03 ENCOUNTER — Telehealth: Payer: Self-pay

## 2016-02-03 NOTE — Telephone Encounter (Signed)
Need to know if she is sexually active and contraception type before refill

## 2016-02-03 NOTE — Telephone Encounter (Signed)
Left message about Ms. Dominique Mcpherson refilling her Levothyroxine for 1 month.   Patient needs to schedule AEX and Lab appt (TSH) for more refills.   Advised patient to call the office to schedule these. -sco

## 2016-02-03 NOTE — Telephone Encounter (Signed)
Left message for patient to call office and schedule  AEX and lab appt for more refills of her medication. -sco

## 2016-02-03 NOTE — Telephone Encounter (Signed)
Needs aex appointment and TSH checked. Will refill x 1 only until exam

## 2016-02-07 DIAGNOSIS — Z23 Encounter for immunization: Secondary | ICD-10-CM | POA: Diagnosis not present

## 2016-02-20 DIAGNOSIS — Z85820 Personal history of malignant melanoma of skin: Secondary | ICD-10-CM | POA: Diagnosis not present

## 2016-02-20 DIAGNOSIS — L814 Other melanin hyperpigmentation: Secondary | ICD-10-CM | POA: Diagnosis not present

## 2016-02-20 DIAGNOSIS — L821 Other seborrheic keratosis: Secondary | ICD-10-CM | POA: Diagnosis not present

## 2016-02-20 DIAGNOSIS — D235 Other benign neoplasm of skin of trunk: Secondary | ICD-10-CM | POA: Diagnosis not present

## 2016-02-23 ENCOUNTER — Other Ambulatory Visit: Payer: Self-pay | Admitting: Certified Nurse Midwife

## 2016-02-23 ENCOUNTER — Encounter: Payer: Self-pay | Admitting: Certified Nurse Midwife

## 2016-02-23 NOTE — Telephone Encounter (Signed)
Medication refill request: buPROPion 300mg  Last AEX:  01/24/15 DL Next AEX: none scheduled Last MMG (if hormonal medication request): 11/19/15 BIRADS 1 negative Refill authorized: 11/05/15 #30 w/3 refills; today please advise

## 2016-02-24 ENCOUNTER — Other Ambulatory Visit: Payer: Self-pay | Admitting: Certified Nurse Midwife

## 2016-02-24 DIAGNOSIS — N938 Other specified abnormal uterine and vaginal bleeding: Secondary | ICD-10-CM

## 2016-03-12 ENCOUNTER — Ambulatory Visit (INDEPENDENT_AMBULATORY_CARE_PROVIDER_SITE_OTHER): Payer: BLUE CROSS/BLUE SHIELD | Admitting: Certified Nurse Midwife

## 2016-03-12 ENCOUNTER — Encounter: Payer: Self-pay | Admitting: Certified Nurse Midwife

## 2016-03-12 VITALS — BP 110/64 | HR 70 | Resp 16 | Ht 61.75 in | Wt 348.0 lb

## 2016-03-12 DIAGNOSIS — Z01419 Encounter for gynecological examination (general) (routine) without abnormal findings: Secondary | ICD-10-CM

## 2016-03-12 DIAGNOSIS — N912 Amenorrhea, unspecified: Secondary | ICD-10-CM

## 2016-03-12 DIAGNOSIS — E039 Hypothyroidism, unspecified: Secondary | ICD-10-CM

## 2016-03-12 DIAGNOSIS — Z0000 Encounter for general adult medical examination without abnormal findings: Secondary | ICD-10-CM

## 2016-03-12 DIAGNOSIS — Z124 Encounter for screening for malignant neoplasm of cervix: Secondary | ICD-10-CM

## 2016-03-12 DIAGNOSIS — E559 Vitamin D deficiency, unspecified: Secondary | ICD-10-CM | POA: Diagnosis not present

## 2016-03-12 DIAGNOSIS — N938 Other specified abnormal uterine and vaginal bleeding: Secondary | ICD-10-CM

## 2016-03-12 DIAGNOSIS — M1990 Unspecified osteoarthritis, unspecified site: Secondary | ICD-10-CM | POA: Diagnosis not present

## 2016-03-12 DIAGNOSIS — Z713 Dietary counseling and surveillance: Secondary | ICD-10-CM | POA: Diagnosis not present

## 2016-03-12 LAB — POCT URINALYSIS DIPSTICK
Bilirubin, UA: NEGATIVE
Blood, UA: NEGATIVE
Glucose, UA: NEGATIVE
KETONES UA: NEGATIVE
Leukocytes, UA: NEGATIVE
Nitrite, UA: NEGATIVE
Protein, UA: NEGATIVE
Urobilinogen, UA: NEGATIVE
pH, UA: 5

## 2016-03-12 LAB — POCT URINE PREGNANCY: Preg Test, Ur: NEGATIVE

## 2016-03-12 MED ORDER — NORETHINDRONE ACETATE 5 MG PO TABS: 5.0000 mg | ORAL_TABLET | Freq: Every day | ORAL | 4 refills | Status: DC

## 2016-03-12 NOTE — Patient Instructions (Signed)

## 2016-03-12 NOTE — Progress Notes (Signed)
50 y.o. G0P0000 Single  Caucasian Fe here for annual exam. Periods every other month with Aygestin use. Happy with choice. Sees PCP for aex/labs, anxiety medication now , GERD, and back pain. All labs per patient normal. Did not draw TSH, which hypothyroid is managed here and she wuld prefer this. No health  Issues today. Minimal weight gain now.  Patient's last menstrual period was 02/09/2016 (exact date).          Sexually active: No.  The current method of family planning is abstinence.    Exercising: No.  exercise Smoker:  no  Health Maintenance: Pap:  12-10-13 neg MMG:  11-19-15 category a density birads 1:neg Colonoscopy:  2015 neg f/u 12yrs BMD:   none TDaP:  2010 Shingles: no Pneumonia: 8/17 Hep C and HIV:  Not done Labs: poct urine-neg, upt-neg pt takes aygestin & not sexually active Self breast exam: done occ   reports that she has never smoked. She has never used smokeless tobacco. She reports that she drinks about 1.2 oz of alcohol per week . She reports that she does not use drugs.  Past Medical History:  Diagnosis Date  . Acid reflux   . Anxiety   . Depression   . Hypothyroidism   . Knee osteoarthritis    with degenerative medial meniscus tear  . Meniscus tear   . Osteoarthritis     Past Surgical History:  Procedure Laterality Date  . CHOLECYSTECTOMY    . FOOT SURGERY     bilateral plantar fascitis  . HYSTEROSCOPY W/D&C N/A 01/31/2013   Procedure: DILATATION AND CURETTAGE  TRU CLEAR /HYSTEROSCOPY;  Surgeon: Azalia Bilis, MD;  Location: [facility];  Service: Gynecology;  Laterality: N/A;  . MELANOMA EXCISION Right 05/17/2013   Procedure: WIDE EXCISION MELANOMA RIGHT UPPER ARM;  Surgeon: Harl Bowie, MD;  Location: [facility];  Service: General;  Laterality: Right;  . TONSILLECTOMY    . UPPER GI ENDOSCOPY  3/15    Current Outpatient Prescriptions  Medication Sig Dispense Refill  . acetaminophen (TYLENOL) 500 MG tablet Take 1,000 mg by  mouth as needed.     Marland Kitchen buPROPion (WELLBUTRIN XL) 300 MG 24 hr tablet TAKE 1 TABLET(300 MG) BY MOUTH DAILY 30 tablet 0  . diclofenac (CATAFLAM) 50 MG tablet     . EPINEPHrine (EPIPEN) 0.3 mg/0.3 mL IJ SOAJ injection Inject 0.3 mg into the muscle as needed.    Marland Kitchen FLUoxetine (PROZAC) 20 MG tablet Take 20 mg by mouth daily.    . fluticasone (FLONASE) 50 MCG/ACT nasal spray as needed.    Marland Kitchen ibuprofen (ADVIL,MOTRIN) 200 MG tablet Take 800 mg by mouth every 6 (six) hours as needed for pain.    Marland Kitchen ketoconazole (NIZORAL) 2 % cream Apply topically.    Marland Kitchen levothyroxine (SYNTHROID, LEVOTHROID) 75 MCG tablet TAKE 1 TABLET BY MOUTH DAILY BEFORE BREAKFAST 30 tablet 0  . NEXIUM 40 MG capsule daily.    . norethindrone (AYGESTIN) 5 MG tablet TAKE 1 TABLET BY MOUTH ON DAYS 1-10 30 tablet 0  . tizanidine (ZANAFLEX) 2 MG capsule as needed.     No current facility-administered medications for this visit.     Family History  Problem Relation Age of Onset  . Hypertension Brother   . Cancer Paternal Grandmother     colon  . Liver cancer Paternal Grandmother     ROS:  Pertinent items are noted in HPI.  Otherwise, a comprehensive ROS was negative.  Exam:  BP 110/64   Pulse 70   Resp 16   Ht 5' 1.75" (1.568 m)   Wt (!) 348 lb (157.9 kg)   LMP 02/09/2016 (Exact Date)   BMI 64.17 kg/m  Height: 5' 1.75" (156.8 cm) Ht Readings from Last 3 Encounters:  03/12/16 5' 1.75" (1.568 m)  01/24/15 5' 2.25" (1.581 m)  12/10/13 5' 2.25" (1.581 m)    General appearance: alert, cooperative and appears stated age Head: Normocephalic, without obvious abnormality, atraumatic Neck: no adenopathy, supple, symmetrical, trachea midline and thyroid normal to inspection and palpation Lungs: clear to auscultation bilaterally Breasts: normal appearance, no masses or tenderness, No nipple retraction or dimpling, No nipple discharge or bleeding, No axillary or supraclavicular adenopathy Heart: regular rate and rhythm Abdomen:  soft, non-tender; no masses,  no organomegaly Extremities: extremities normal, atraumatic, no cyanosis or edema Skin: Skin color, texture, turgor normal. No rashes or lesions Lymph nodes: Cervical, supraclavicular, and axillary nodes normal. No abnormal inguinal nodes palpated Neurologic: Grossly normal   Pelvic: External genitalia:  no lesions              Urethra:  normal appearing urethra with no masses, tenderness or lesions              Bartholin's and Skene's: normal                 Vagina: normal appearing vagina with normal color and discharge, no lesions              Cervix: no cervical motion tenderness and no lesions              Pap taken: Yes.   Bimanual Exam:  Uterus:  normal and no large masses palpated., limited by body habitus              Adnexa: no mass, fullness, tenderness and adnexa not palpated, limited by body habitus               Rectovaginal: Confirms               Anus:  normal sphincter tone, no lesions  Chaperone present: yes  A:  Well Woman with normal exam  Contraception none needed not sexual activity  Aygestin use for cycle control of long periods of amenorrhea working well  Hypothyroid stable   MD management for allergies, anxiety  Morbid Obesity  P:   Reviewed health and wellness pertinent to exam  Discussed risks and benefits of Aygestin use and bleeding profile with use. Desires continuance  Will continue on current dosage of thyroid medication until lab in.  Lab TSH  Discussed with patient now that she is on two medications for anxiety with addition of Prozac from PCP, needs PCP management of Wellbutrin also. Patient has updated Rx and will discuss with PCP.  Discussed limitations of pelvic exam due to weight and had PUS last year with normal findings will not need until 2018, unless period profile or other changes occur.Will advise if change or concerns. Encourage Bariatric consult , declines.  Pap smear as above with HPV reflex  counseled on  breast self exam, mammography screening, adequate intake of calcium and vitamin D, diet and exercise  return annually or prn  An After Visit Summary was printed and given to the patient.

## 2016-03-13 LAB — TSH: TSH: 1.96 m[IU]/L

## 2016-03-15 LAB — IPS PAP TEST WITH REFLEX TO HPV

## 2016-03-17 ENCOUNTER — Other Ambulatory Visit: Payer: Self-pay | Admitting: Certified Nurse Midwife

## 2016-03-17 DIAGNOSIS — E039 Hypothyroidism, unspecified: Secondary | ICD-10-CM

## 2016-03-17 MED ORDER — LEVOTHYROXINE SODIUM 75 MCG PO TABS: 75.0000 ug | ORAL_TABLET | Freq: Every day | ORAL | 12 refills | Status: DC

## 2016-03-19 NOTE — Progress Notes (Signed)
Encounter reviewed Jill Jertson, MD   

## 2016-04-27 DIAGNOSIS — J9801 Acute bronchospasm: Secondary | ICD-10-CM | POA: Diagnosis not present

## 2016-04-27 DIAGNOSIS — J028 Acute pharyngitis due to other specified organisms: Secondary | ICD-10-CM | POA: Diagnosis not present

## 2016-04-27 DIAGNOSIS — R05 Cough: Secondary | ICD-10-CM | POA: Diagnosis not present

## 2016-05-07 DIAGNOSIS — J209 Acute bronchitis, unspecified: Secondary | ICD-10-CM | POA: Diagnosis not present

## 2016-06-02 ENCOUNTER — Encounter (INDEPENDENT_AMBULATORY_CARE_PROVIDER_SITE_OTHER): Payer: Self-pay | Admitting: Orthopedic Surgery

## 2016-06-02 ENCOUNTER — Ambulatory Visit (INDEPENDENT_AMBULATORY_CARE_PROVIDER_SITE_OTHER): Payer: Self-pay

## 2016-06-02 ENCOUNTER — Ambulatory Visit (INDEPENDENT_AMBULATORY_CARE_PROVIDER_SITE_OTHER): Payer: BLUE CROSS/BLUE SHIELD | Admitting: Orthopedic Surgery

## 2016-06-02 DIAGNOSIS — M25562 Pain in left knee: Secondary | ICD-10-CM

## 2016-06-02 DIAGNOSIS — M545 Low back pain: Secondary | ICD-10-CM | POA: Diagnosis not present

## 2016-06-02 DIAGNOSIS — G8929 Other chronic pain: Secondary | ICD-10-CM | POA: Diagnosis not present

## 2016-06-02 MED ORDER — METHYLPREDNISOLONE ACETATE 40 MG/ML IJ SUSP
40.0000 mg | INTRAMUSCULAR | Status: AC | PRN
  Administered 2016-06-02: 40 mg via INTRA_ARTICULAR

## 2016-06-02 MED ORDER — LIDOCAINE HCL 1 % IJ SOLN
5.0000 mL | INTRAMUSCULAR | Status: AC | PRN
  Administered 2016-06-02: 5 mL

## 2016-06-02 MED ORDER — BUPIVACAINE HCL 0.25 % IJ SOLN
4.0000 mL | INTRAMUSCULAR | Status: AC | PRN
  Administered 2016-06-02: 4 mL via INTRA_ARTICULAR

## 2016-06-02 NOTE — Progress Notes (Signed)
Office Visit Note   Patient: Dominique Mcpherson           Date of Birth: [birth year]           MRN: [id number] Visit Date: 06/02/2016 Requested by: Leeroy Cha, MD [address] [place], [place] [address] PCP: Leeroy Cha, MD  Subjective: Chief Complaint  Patient presents with  . Left Knee - Pain  . 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  She describes low back pain atraumatic onset for the past 3 months.  Primarily left-sided which she localizes to the sacroiliac joint.  Denies any radiculopathy.  Is uncomfortable for her to lay flat in the bed.  Patient also describes left knee pain for the past year.  She had an MRI scan in 2014 which showed arthritis with a small degenerative meniscal tear.  She's had injections in the past.  She states it is hard for her to get up and down stairs and the pain is been worse over the past 2 weeks.  She's tried taking Tylenol Aleve and ibuprofen but has not had much relief.  She is interested in starting the stationary bike for some exercises and placed near her work              Review of Systems All systems reviewed are negative as they relate to the chief complaint within the history of present illness.  Patient denies  fevers or chills.    Assessment & Plan: Visit Diagnoses:  1. Chronic left-sided low back pain without sciatica   2. Chronic pain of left knee     Plan: Impression is endstage knee arthritis and facet arthritis in the back.  In regards to the back if her symptoms are really bad then we can consider doing an MRI scan with likely injections to follow but I don't think she's quite at that point yet.  She is going to see if doing some exercising near her workplace will help her back.  In regards to her knee we did inject the knee today with Marcaine and Depo-Medrol.  Hopefully approve her for Synvisc and when that shot  wears off she can come in and get the Synvisc injection which is also helped her before.  Follow-Up Instructions: No Follow-up on file.   Orders:  Orders Placed This Encounter  Procedures  . XR Knee 1-2 Views Left  . XR Lumbar Spine 2-3 Views   No orders of the defined types were placed in this encounter.     Procedures: Large Joint Inj Date/Time: 06/02/2016 12:14 PM Performed by: Meredith Pel Authorized by: Meredith Pel   Consent Given by:  Patient Site marked: the procedure site was marked   Timeout: prior to procedure the correct patient, procedure, and site was verified   Indications:  Pain, joint swelling and diagnostic evaluation Location:  Knee Site:  L knee Prep: patient was prepped and draped in usual sterile fashion   Needle Size:  18 G Needle Length:  1.5 inches Approach:  Superolateral Ultrasound Guidance: No   Fluoroscopic Guidance: No   Arthrogram: No   Medications:  5 mL lidocaine 1 %; 4 mL bupivacaine 0.25 %; 40 mg methylPREDNISolone acetate 40 MG/ML Patient tolerance:  Patient tolerated the procedure well with no immediate complications     Clinical Data: No additional findings.  Objective: Vital Signs:  There were no vitals taken for this visit.  Physical Exam   Constitutional: Patient appears well-developed HEENT:  Head: Normocephalic Eyes:EOM are normal Neck: Normal range of motion Cardiovascular: Normal rate Pulmonary/chest: Effort normal Neurologic: Patient is alert Skin: Skin is warm Psychiatric: Patient has normal mood and affect    Ortho Exam examination of the left knee demonstrates a difficult exam due to the soft tissue envelope.  In general the leg comes out straight and can bend close to 90 collaterals are stable pedal pulses intact I don't think there is much effusion but again it's difficult to assess no other masses lymph adenopathy or skin changes noted around the left knee region.  In regards to the fact  there is no nerve root tension sign.  There's some groin pain with internal/external rotation on the left but not right.  No real paresthesias L1 S1 bilaterally no trochanteric tenderness is noted  Specialty Comments:  No specialty comments available.  Imaging: Xr Knee 1-2 Views Left  Result Date: 06/02/2016 Left knee 2 views reviewed AP and lateral.  Osteophyte seen around the patella and on the medial femoral condyle on the lateral view.  Bone on bone arthritis is present on the medial side and patellofemoral side.  Large soft tissue envelope is present around the knee.  Xr Lumbar Spine 2-3 Views  Result Date: 06/02/2016 AP lateral lumbar spine reviewed.  Significant lumbar lordosis is present.  Hip joints appear maintained.  Spine is straight.  Facet arthritis is present.  No spondylolisthesis or compression fracture seen    PMFS History: Patient Active Problem List   Diagnosis Date Noted  . Melanoma of upper arm ([facility]) 05/09/2013  . Morbid obesity ([facility]) 11/29/2012   Past Medical History:  Diagnosis Date  . Acid reflux   . Anxiety   . Depression   . Hypothyroidism   . Knee osteoarthritis    with degenerative medial meniscus tear  . Meniscus tear   . Osteoarthritis     Family History  Problem Relation Age of Onset  . Hypertension Brother   . Cancer Paternal Grandmother     colon  . Liver cancer Paternal Grandmother     Past Surgical History:  Procedure Laterality Date  . CHOLECYSTECTOMY    . FOOT SURGERY     bilateral plantar fascitis  . HYSTEROSCOPY W/D&C N/A 01/31/2013   Procedure: DILATATION AND CURETTAGE  TRU CLEAR /HYSTEROSCOPY;  Surgeon: Azalia Bilis, MD;  Location: [facility];  Service: Gynecology;  Laterality: N/A;  . MELANOMA EXCISION Right 05/17/2013   Procedure: WIDE EXCISION MELANOMA RIGHT UPPER ARM;  Surgeon: Harl Bowie, MD;  Location: [facility];  Service: General;  Laterality: Right;  . TONSILLECTOMY    . UPPER GI ENDOSCOPY  3/15     Social History   Occupational History  . Not on file.   Social History Main Topics  . Smoking status: Never Smoker  . Smokeless tobacco: Never Used  . Alcohol use 1.2 oz/week    2 Standard drinks or equivalent per week     Comment: occ  . Drug use: No  . Sexual activity: No

## 2016-06-07 ENCOUNTER — Telehealth (INDEPENDENT_AMBULATORY_CARE_PROVIDER_SITE_OTHER): Payer: Self-pay | Admitting: Radiology

## 2016-06-07 NOTE — Telephone Encounter (Signed)
IC patient and advised ok to do Synvisc One gel injection, insurance will cover at 100% after a $20 copay.  No PA needed.  She will call when she wants to schedule

## 2016-06-09 ENCOUNTER — Ambulatory Visit (INDEPENDENT_AMBULATORY_CARE_PROVIDER_SITE_OTHER): Payer: BLUE CROSS/BLUE SHIELD | Admitting: Family Medicine

## 2016-06-09 ENCOUNTER — Encounter: Payer: Self-pay | Admitting: Family Medicine

## 2016-06-09 DIAGNOSIS — M7660 Achilles tendinitis, unspecified leg: Secondary | ICD-10-CM | POA: Diagnosis not present

## 2016-06-09 DIAGNOSIS — M25522 Pain in left elbow: Secondary | ICD-10-CM | POA: Diagnosis not present

## 2016-06-09 MED ORDER — DICLOFENAC SODIUM 1 % TD GEL: 2.0000 g | Freq: Four times a day (QID) | TRANSDERMAL | 2 refills | Status: DC

## 2016-06-09 NOTE — Progress Notes (Signed)
  Dominique Mcpherson - 51 y.o. female MRN [id number]  Date of birth: 1965-05-25  SUBJECTIVE:  Including CC & ROS.   [name] is presenting with left elbow and right Achilles pain. The elbow pain is on the lateral aspect of her left elbow has been present for one month. She feels like the pain is worse with picking things up. 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 The pain has remained the same. She denies any inciting event or radiation. She has not tried any over-the-counter medications.  She is complaining of right Achilles pain that is 6 cm proximal to the insertion. This pain has occurred before. It's been ongoing for about a month. She denies any inciting event. It is localized to this area. She denies any injury. She has not tried any medications.  ROS: No unexpected weight loss, fever, chills, swelling, instability, numbness/tingling, redness, otherwise see HPI    HISTORY: Past Medical, Surgical, Social, and Family History Reviewed & Updated per EMR.   Pertinent Historical Findings include: PMSHx -  Morbid obesity, melanoma  PSHx -  No tobacco, occasional alcohol use  FHx -  None  Medications - wellbutrin, prozac   DATA REVIEWED: None to review   PHYSICAL EXAM:  VS: BP:(!) 143/71  HR:75bpm  TEMP: ( )  RESP:   HT:5\' 3"  (160 cm)   WT:(!) 349 lb (158.3 kg)  BMI:62 PHYSICAL EXAM: Gen: NAD, alert, cooperative with exam, well-appearing HEENT: clear conjunctiva, EOMI CV:  no edema, capillary refill brisk,  Resp: non-labored, normal speech Skin: no rashes, normal turgor  Neuro: no gross deficits.  Psych:  alert and oriented Left elbow: No obvious effusion. Some tenderness to palpation of the brachial radialis. Tenderness to palpation of the lateral condyle. Some pain with resistance to supination and pronation. Normal grip strength. Normal wrist range of motion. Normal strength to resistance of the wrist Right ankle:  Tenderness  to palpation over the mid belly of the achilles roughly 6 and meters proximally from insertion Some limited dorsiflexion. No tenderness to palpation at the insertion or medial calcaneus. Normal strength to resistance.    ASSESSMENT & PLAN:   Left elbow pain Most likely lateral epicondylitis associated with signing. Does not appear to be supinator syndrome but cannot rule out completely. - Try Voltaren cream as she has reflux and cannot take oral anti-inflammatories. - Provided home exercises. - Follow-up in 4 weeks. Can consider injection if no improvement.  Achilles tendon pain Most likely tendinopathy that is associated with her limited dorsiflexion. - Provided with heel less for each foot. - Provided home exercises - To consider ultrasound to follow-up - If no improvement at follow-up contact consider nitroglycerin patches

## 2016-06-11 DIAGNOSIS — M25522 Pain in left elbow: Secondary | ICD-10-CM | POA: Insufficient documentation

## 2016-06-11 DIAGNOSIS — M7660 Achilles tendinitis, unspecified leg: Secondary | ICD-10-CM | POA: Insufficient documentation

## 2016-06-11 NOTE — Assessment & Plan Note (Signed)
Most likely lateral epicondylitis associated with signing. Does not appear to be supinator syndrome but cannot rule out completely. - Try Voltaren cream as she has reflux and cannot take oral anti-inflammatories. - Provided home exercises. - Follow-up in 4 weeks. Can consider injection if no improvement.

## 2016-06-11 NOTE — Assessment & Plan Note (Signed)
Most likely tendinopathy that is associated with her limited dorsiflexion. - Provided with heel less for each foot. - Provided home exercises - To consider ultrasound to follow-up - If no improvement at follow-up contact consider nitroglycerin patches

## 2016-06-24 ENCOUNTER — Ambulatory Visit (INDEPENDENT_AMBULATORY_CARE_PROVIDER_SITE_OTHER): Payer: BLUE CROSS/BLUE SHIELD | Admitting: Orthopedic Surgery

## 2016-06-24 ENCOUNTER — Encounter (INDEPENDENT_AMBULATORY_CARE_PROVIDER_SITE_OTHER): Payer: Self-pay | Admitting: Orthopedic Surgery

## 2016-06-24 DIAGNOSIS — M1712 Unilateral primary osteoarthritis, left knee: Secondary | ICD-10-CM | POA: Diagnosis not present

## 2016-06-24 NOTE — Progress Notes (Signed)
   Procedure Note  Patient: Dominique Mcpherson             Date of Birth: 12-03-1965           MRN: [id number]             Visit Date: 06/24/2016  Procedures: Visit Diagnoses: Primary osteoarthritis of left knee  Large Joint Inj Date/Time: 06/24/2016 4:11 PM Performed by: Brand Males Authorized by: Meredith Pel   Consent Given by:  Patient Site marked: the procedure site was marked   Timeout: prior to procedure the correct patient, procedure, and site was verified   Indications:  Pain, joint swelling and diagnostic evaluation Location:  Knee Site:  L knee Prep: patient was prepped and draped in usual sterile fashion   Needle Size:  18 G Needle Length:  1.5 inches Approach:  Superolateral Ultrasound Guidance: No   Fluoroscopic Guidance: No   Arthrogram: No   Medications:  5 mL lidocaine 1 %; 48 mg Hylan 48 MG/6ML Patient tolerance:  Patient tolerated the procedure well with no immediate complications

## 2016-06-27 MED ORDER — HYLAN G-F 20 48 MG/6ML IX SOSY
48.0000 mg | PREFILLED_SYRINGE | INTRA_ARTICULAR | Status: AC | PRN
  Administered 2016-06-24: 48 mg via INTRA_ARTICULAR

## 2016-06-27 MED ORDER — LIDOCAINE HCL 1 % IJ SOLN
5.0000 mL | INTRAMUSCULAR | Status: AC | PRN
  Administered 2016-06-24: 5 mL

## 2016-06-27 NOTE — Progress Notes (Signed)
Office Visit Note   Patient: Dominique Mcpherson           Date of Birth: 06-09-1965           MRN: [id number] Visit Date: 06/24/2016 Requested by: Leeroy Cha, MD [address] [place], [place] [address] PCP: Leeroy Cha, MD  Subjective: Chief Complaint  Patient presents with  . Left Knee - Pain    HPI [name] is a 51 year old female with left knee pain.  She has morbid obesity.  She still is having pain 7 out of 10.  She has more pain with movement.  She describes cracking popping in giving way.  She's had success with the Synvisc injections in the past.  Last one was about a year ago.  No interval injury.              Review of Systems All systems reviewed are negative as they relate to the chief complaint within the history of present illness.  Patient denies  fevers or chills.    Assessment & Plan: Visit Diagnoses:  1. Primary osteoarthritis of left knee     Plan: Impression is left knee arthritis.  Synvisc injection performed today through a superolateral approach.  We'll see him she does that injection.  She is not really a candidate right now for knee replacement based on medical comorbidities.  I'll see her back as needed  Follow-Up Instructions: Return if symptoms worsen or fail to improve.   Orders:  Orders Placed This Encounter  Procedures  . Large Joint Injection/Arthrocentesis   No orders of the defined types were placed in this encounter.     Procedures: No procedures performed   Clinical Data: No additional findings.  Objective: Vital Signs: There were no vitals taken for this visit.  Physical Exam   Constitutional: Patient appears well-developed HEENT:  Head: Normocephalic Eyes:EOM are normal Neck: Normal range of motion Cardiovascular: Normal rate Pulmonary/chest: Effort normal Neurologic: Patient is alert Skin: Skin is warm Psychiatric: Patient has normal mood and affect    Ortho Exam orthopedic exam  demonstrates increased body mass index.  Pedal pulses palpable.  Range of motion of the knee is from about full extension to 90 of flexion.  Collaterals are stable extensor mechanism is intact on the left.  Pannus overlies the patella.  Specialty Comments:  No specialty comments available.  Imaging: No results found.   PMFS History: Patient Active Problem List   Diagnosis Date Noted  . Left elbow pain 06/11/2016  . Achilles tendon pain 06/11/2016  . Melanoma of upper arm ([place]) 05/09/2013  . Morbid obesity ([place]) 11/29/2012   Past Medical History:  Diagnosis Date  . Acid reflux   . Anxiety   . Depression   . Hypothyroidism   . Knee osteoarthritis    with degenerative medial meniscus tear  . Meniscus tear   . Osteoarthritis     Family History  Problem Relation Age of Onset  . Hypertension Brother   . Cancer Paternal Grandmother     colon  . Liver cancer Paternal Grandmother     Past Surgical History:  Procedure Laterality Date  . CHOLECYSTECTOMY    . FOOT SURGERY     bilateral plantar fascitis  . HYSTEROSCOPY W/D&C N/A 01/31/2013   Procedure: DILATATION AND CURETTAGE  TRU CLEAR /HYSTEROSCOPY;  Surgeon: Azalia Bilis, MD;  Location: [facility];  Service: Gynecology;  Laterality: N/A;  . MELANOMA EXCISION Right 05/17/2013   Procedure: WIDE EXCISION  MELANOMA RIGHT UPPER ARM;  Surgeon: Harl Bowie, MD;  Location: [facility];  Service: General;  Laterality: Right;  . TONSILLECTOMY    . UPPER GI ENDOSCOPY  3/15   Social History   Occupational History  . Not on file.   Social History Main Topics  . Smoking status: Never Smoker  . Smokeless tobacco: Never Used  . Alcohol use 1.2 oz/week    2 Standard drinks or equivalent per week     Comment: occ  . Drug use: No  . Sexual activity: No

## 2016-07-02 ENCOUNTER — Ambulatory Visit (INDEPENDENT_AMBULATORY_CARE_PROVIDER_SITE_OTHER): Payer: BLUE CROSS/BLUE SHIELD

## 2016-07-02 ENCOUNTER — Ambulatory Visit: Payer: BLUE CROSS/BLUE SHIELD

## 2016-07-02 ENCOUNTER — Encounter: Payer: Self-pay | Admitting: Podiatry

## 2016-07-02 ENCOUNTER — Ambulatory Visit (INDEPENDENT_AMBULATORY_CARE_PROVIDER_SITE_OTHER): Payer: BLUE CROSS/BLUE SHIELD | Admitting: Podiatry

## 2016-07-02 DIAGNOSIS — M7661 Achilles tendinitis, right leg: Secondary | ICD-10-CM

## 2016-07-02 DIAGNOSIS — M79671 Pain in right foot: Secondary | ICD-10-CM

## 2016-07-02 MED ORDER — DICLOFENAC SODIUM 75 MG PO TBEC: 75.0000 mg | DELAYED_RELEASE_TABLET | Freq: Two times a day (BID) | ORAL | 2 refills | Status: DC

## 2016-07-02 NOTE — Patient Instructions (Signed)

## 2016-07-04 NOTE — Progress Notes (Signed)
Subjective:     Patient ID: Dominique Mcpherson, female   DOB: 30-Aug-1965, 51 y.o.   MRN: [id number]  HPI patient presents stating that she's having a lot of pain in the back of her right ankle and that it started recently and she's also had knee problems   Review of Systems     Objective:   Physical Exam  Neurovascular status intact muscle strength adequate with patient found have posterior pain in the right Achilles at the muscle tendon junction with no discomfort at the insertion. Patient is obese which is complicating factor    Assessment:     Musculotendinous Achilles tendinitis right knee problems and tremendous weightbearing pressures    Plan:     Reviewed condition and explained to her at great length. Explain chances for rupture in the future and at this point organ immobilize and then consider physical therapy depending on response. Patient is placed into a short air fracture walker was given instructions on heat ice therapy placed on anti-inflammatory and will be seen back again in 3 weeks  X-ray report indicates no signs of increased soft tissue density with minimal spurring on the heel but

## 2016-07-16 ENCOUNTER — Other Ambulatory Visit: Payer: Self-pay | Admitting: *Deleted

## 2016-07-19 ENCOUNTER — Telehealth (INDEPENDENT_AMBULATORY_CARE_PROVIDER_SITE_OTHER): Payer: Self-pay | Admitting: Orthopedic Surgery

## 2016-07-19 NOTE — Telephone Encounter (Signed)
Synvisc One was 06/24/16.  Please advise

## 2016-07-19 NOTE — Telephone Encounter (Signed)
Pt called and stated that the Synvisc inj did not give her positive results and wants to know if she needs another to get better results. Requested a call back at [phone]

## 2016-07-19 NOTE — Telephone Encounter (Signed)
She can get anymore for 6 months please call thanks

## 2016-07-20 NOTE — Telephone Encounter (Signed)
I called pt and discussed, she Dominique Mcpherson want another cortisone injection.  She thinks her gait being off is affecting/increasing her knee pain.  She gets the fracture boot off early April and will call and make an appt for re eval/poss injection.

## 2016-08-02 ENCOUNTER — Ambulatory Visit (INDEPENDENT_AMBULATORY_CARE_PROVIDER_SITE_OTHER): Payer: BLUE CROSS/BLUE SHIELD | Admitting: Podiatry

## 2016-08-02 ENCOUNTER — Encounter: Payer: Self-pay | Admitting: Podiatry

## 2016-08-02 DIAGNOSIS — M7661 Achilles tendinitis, right leg: Secondary | ICD-10-CM | POA: Diagnosis not present

## 2016-08-02 NOTE — Progress Notes (Signed)
Subjective:     Patient ID: Dominique Mcpherson, female   DOB: 05-07-1965, 51 y.o.   MRN: [id number]  HPI patient presents stating my right heel is feeling quite a bit better   Review of Systems     Objective:   Physical Exam Neurovascular status intact negative Homans sign was noted with patient's posterior right heel quite a bit improved with no swelling no warmth of the tendon itself and good range of motion with no indication of muscle strength loss    Assessment:     Achilles tendinitis right improved    Plan:     Advised on stretching exercises heat ice therapy and he'll lift therapy. Patient will use boot as needed and will be seen back to recheck

## 2016-08-27 ENCOUNTER — Encounter: Payer: Self-pay | Admitting: Certified Nurse Midwife

## 2016-08-27 ENCOUNTER — Other Ambulatory Visit: Payer: Self-pay | Admitting: Certified Nurse Midwife

## 2016-08-27 MED ORDER — BUPROPION HCL ER (XL) 300 MG PO TB24: 300.0000 mg | ORAL_TABLET | Freq: Every day | ORAL | 0 refills | Status: DC

## 2016-08-27 NOTE — Telephone Encounter (Signed)
See refill encounter dated 08/27/16.

## 2016-08-27 NOTE — Telephone Encounter (Signed)
Medication refill request: Bupropion Last AEX:  03/12/16 DL Next AEX: 03/16/17 DL Last MMG (if hormonal medication request): 11/19/15 BIRADS1, Density A, [facility] Refill authorized: 02/24/16 #30 0R. Please Advise. Thank you.

## 2016-09-14 DIAGNOSIS — R51 Headache: Secondary | ICD-10-CM | POA: Diagnosis not present

## 2016-09-17 DIAGNOSIS — D239 Other benign neoplasm of skin, unspecified: Secondary | ICD-10-CM | POA: Diagnosis not present

## 2016-09-17 DIAGNOSIS — L821 Other seborrheic keratosis: Secondary | ICD-10-CM | POA: Diagnosis not present

## 2016-09-17 DIAGNOSIS — D1801 Hemangioma of skin and subcutaneous tissue: Secondary | ICD-10-CM | POA: Diagnosis not present

## 2016-09-17 DIAGNOSIS — L814 Other melanin hyperpigmentation: Secondary | ICD-10-CM | POA: Diagnosis not present

## 2016-09-23 ENCOUNTER — Other Ambulatory Visit: Payer: Self-pay | Admitting: Obstetrics & Gynecology

## 2016-09-24 NOTE — Telephone Encounter (Signed)
Patient is requesting a refill Wellbutrin. Patient is going out of town tomorrow and is asking if this can be refilled today if possible.

## 2016-09-24 NOTE — Telephone Encounter (Signed)
Will refill one RX and check with patient to see if has restarted Prozac when refill due for wellbutrin

## 2016-09-24 NOTE — Telephone Encounter (Signed)
Medication refill request: Bupropion Last AEX:  03/12/16 DL Next AEX: 03/16/17 DL Last MMG (if hormonal medication request): 11/19/15 BIRADS1, Density A, [facility] Refill authorized: 08/27/16 #30 0R. Please advise. Thank you.

## 2016-09-24 NOTE — Telephone Encounter (Signed)
See refill encounter dated 09/23/16.

## 2016-09-24 NOTE — Telephone Encounter (Signed)
Spoke to patient, she has not been taking Prozac since January. She states her PCP moved and the replacement PCP will not refill the Prozac. Please advise. Thank you.

## 2016-09-24 NOTE — Telephone Encounter (Signed)
Per chart note we discussed she was on two medications Wellbutrin which I have prescribed, but had also be given Prozac by PCP. I discussed since on two medications would need PCP management of both. Her med Profile shows Prozac still active medication

## 2016-09-24 NOTE — Telephone Encounter (Signed)
Spoke to pharmacy and patient does not have anymore refills of prozac available.

## 2016-10-20 DIAGNOSIS — Z0000 Encounter for general adult medical examination without abnormal findings: Secondary | ICD-10-CM | POA: Diagnosis not present

## 2016-10-20 DIAGNOSIS — Z13220 Encounter for screening for lipoid disorders: Secondary | ICD-10-CM | POA: Diagnosis not present

## 2016-10-20 DIAGNOSIS — E039 Hypothyroidism, unspecified: Secondary | ICD-10-CM | POA: Diagnosis not present

## 2016-11-05 ENCOUNTER — Other Ambulatory Visit: Payer: Self-pay | Admitting: Internal Medicine

## 2016-11-05 DIAGNOSIS — J0110 Acute frontal sinusitis, unspecified: Secondary | ICD-10-CM | POA: Diagnosis not present

## 2016-11-05 DIAGNOSIS — Z1231 Encounter for screening mammogram for malignant neoplasm of breast: Secondary | ICD-10-CM

## 2016-11-19 ENCOUNTER — Ambulatory Visit
Admission: RE | Admit: 2016-11-19 | Discharge: 2016-11-19 | Disposition: A | Discharge status: Home / Self Care | Payer: BLUE CROSS/BLUE SHIELD | Source: Ambulatory Visit | Attending: Internal Medicine | Admitting: Internal Medicine

## 2016-11-19 DIAGNOSIS — Z1231 Encounter for screening mammogram for malignant neoplasm of breast: Secondary | ICD-10-CM

## 2017-02-06 ENCOUNTER — Encounter: Payer: Self-pay | Admitting: Certified Nurse Midwife

## 2017-02-07 ENCOUNTER — Other Ambulatory Visit: Payer: Self-pay | Admitting: Certified Nurse Midwife

## 2017-02-07 DIAGNOSIS — E039 Hypothyroidism, unspecified: Secondary | ICD-10-CM

## 2017-02-14 NOTE — Telephone Encounter (Signed)
Routing to provider for final review. Patient agreeable to disposition. Will close encounter.     

## 2017-03-16 ENCOUNTER — Ambulatory Visit: Payer: BLUE CROSS/BLUE SHIELD | Admitting: Certified Nurse Midwife

## 2017-03-16 ENCOUNTER — Encounter: Payer: Self-pay | Admitting: Certified Nurse Midwife

## 2017-03-16 VITALS — BP 110/72 | HR 68 | Resp 16 | Ht 62.25 in | Wt 314.0 lb

## 2017-03-16 DIAGNOSIS — E669 Obesity, unspecified: Secondary | ICD-10-CM

## 2017-03-16 DIAGNOSIS — E039 Hypothyroidism, unspecified: Secondary | ICD-10-CM

## 2017-03-16 DIAGNOSIS — Z01419 Encounter for gynecological examination (general) (routine) without abnormal findings: Secondary | ICD-10-CM | POA: Diagnosis not present

## 2017-03-16 DIAGNOSIS — N912 Amenorrhea, unspecified: Secondary | ICD-10-CM | POA: Diagnosis not present

## 2017-03-16 LAB — POCT URINE PREGNANCY: Preg Test, Ur: NEGATIVE

## 2017-03-16 NOTE — Patient Instructions (Signed)
EXERCISE AND DIET:  We recommended that you start or continue a regular exercise program for good health. Regular exercise means any activity that makes your heart beat faster and makes you sweat.  We recommend exercising at least 30 minutes per day at least 3 days a week, preferably 4 or 5.  We also recommend a diet low in fat and sugar.  Inactivity, poor dietary choices and obesity can cause diabetes, heart attack, stroke, and kidney damage, among others.    ALCOHOL AND SMOKING:  Women should limit their alcohol intake to no more than 7 drinks/beers/glasses of wine (combined, not each!) per week. Moderation of alcohol intake to this level decreases your risk of breast cancer and liver damage. And of course, no recreational drugs are part of a healthy lifestyle.  And absolutely no smoking or even second hand smoke. Most people know smoking can cause heart and lung diseases, but did you know it also contributes to weakening of your bones? Aging of your skin?  Yellowing of your teeth and nails?  CALCIUM AND VITAMIN D:  Adequate intake of calcium and Vitamin D are recommended.  The recommendations for exact amounts of these supplements seem to change often, but generally speaking 600 mg of calcium (either carbonate or citrate) and 800 units of Vitamin D per day seems prudent. Certain women may benefit from higher intake of Vitamin D.  If you are among these women, your doctor will have told you during your visit.    PAP SMEARS:  Pap smears, to check for cervical cancer or precancers,  have traditionally been done yearly, although recent scientific advances have shown that most women can have pap smears less often.  However, every woman still should have a physical exam from her gynecologist every year. It will include a breast check, inspection of the vulva and vagina to check for abnormal growths or skin changes, a visual exam of the cervix, and then an exam to evaluate the size and shape of the uterus and  ovaries.  And after 49 years of age, a rectal exam is indicated to check for rectal cancers. We will also provide age appropriate advice regarding health maintenance, like when you should have certain vaccines, screening for sexually transmitted diseases, bone density testing, colonoscopy, mammograms, etc.   MAMMOGRAMS:  All women over 40 years old should have a yearly mammogram. Many facilities now offer a "3D" mammogram, which may cost around $50 extra out of pocket. If possible,  we recommend you accept the option to have the 3D mammogram performed.  It both reduces the number of women who will be called back for extra views which then turn out to be normal, and it is better than the routine mammogram at detecting truly abnormal areas.    COLONOSCOPY:  Colonoscopy to screen for colon cancer is recommended for all women at age 50.  We know, you hate the idea of the prep.  We agree, BUT, having colon cancer and not knowing it is worse!!  Colon cancer so often starts as a polyp that can be seen and removed at colonscopy, which can quite literally save your life!  And if your first colonoscopy is normal and you have no family history of colon cancer, most women don't have to have it again for 10 years.  Once every ten years, you can do something that may end up saving your life, right?  We will be happy to help you get it scheduled when you are ready.    Be sure to check your insurance coverage so you understand how much it will cost.  It may be covered as a preventative service at no cost, but you should check your particular policy.     Perimenopause Perimenopause is the time when your body begins to move into the menopause (no menstrual period for 12 straight months). It is a natural process. Perimenopause can begin 2-8 years before the menopause and usually lasts for 1 year after the menopause. During this time, your ovaries may or may not produce an egg. The ovaries vary in their production of estrogen and  progesterone hormones each month. This can cause irregular menstrual periods, difficulty getting pregnant, vaginal bleeding between periods, and uncomfortable symptoms. What are the causes?  Irregular production of the ovarian hormones, estrogen and progesterone, and not ovulating every month. Other causes include:  Tumor of the pituitary gland in the brain.  Medical disease that affects the ovaries.  Radiation treatment.  Chemotherapy.  Unknown causes.  Heavy smoking and excessive alcohol intake can bring on perimenopause sooner.  What are the signs or symptoms?  Hot flashes.  Night sweats.  Irregular menstrual periods.  Decreased sex drive.  Vaginal dryness.  Headaches.  Mood swings.  Depression.  Memory problems.  Irritability.  Tiredness.  Weight gain.  Trouble getting pregnant.  The beginning of losing bone cells (osteoporosis).  The beginning of hardening of the arteries (atherosclerosis). How is this diagnosed? Your health care provider will make a diagnosis by analyzing your age, menstrual history, and symptoms. He or she will do a physical exam and note any changes in your body, especially your female organs. Female hormone tests may or may not be helpful depending on the amount of female hormones you produce and when you produce them. However, other hormone tests may be helpful to rule out other problems. How is this treated? In some cases, no treatment is needed. The decision on whether treatment is necessary during the perimenopause should be made by you and your health care provider based on how the symptoms are affecting you and your lifestyle. Various treatments are available, such as:  Treating individual symptoms with a specific medicine for that symptom.  Herbal medicines that can help specific symptoms.  Counseling.  Group therapy.  Follow these instructions at home:  Keep track of your menstrual periods (when they occur, how heavy  they are, how long between periods, and how long they last) as well as your symptoms and when they started.  Only take over-the-counter or prescription medicines as directed by your health care provider.  Sleep and rest.  Exercise.  Eat a diet that contains calcium (good for your bones) and soy (acts like the estrogen hormone).  Do not smoke.  Avoid alcoholic beverages.  Take vitamin supplements as recommended by your health care provider. Taking vitamin E may help in certain cases.  Take calcium and vitamin D supplements to help prevent bone loss.  Group therapy is sometimes helpful.  Acupuncture may help in some cases. Contact a health care provider if:  You have questions about any symptoms you are having.  You need a referral to a specialist (gynecologist, psychiatrist, or psychologist). Get help right away if:  You have vaginal bleeding.  Your period lasts longer than 8 days.  Your periods are recurring sooner than 21 days.  You have bleeding after intercourse.  You have severe depression.  You have pain when you urinate.  You have severe headaches.  You have vision   problems. This information is not intended to replace advice given to you by your health care provider. Make sure you discuss any questions you have with your health care provider. Document Released: 05/27/2004 Document Revised: 09/25/2015 Document Reviewed: 11/16/2012 Elsevier Interactive Patient Education  2017 Elsevier Inc.  

## 2017-03-16 NOTE — Progress Notes (Signed)
51 y.o. G0P0000 Single  Caucasian Fe here for annual exam. No periods since 01/12/17. Stopped POP and no period since stopped. Some night urination with occasional  Urgent occurrence with voiding x 2 only. Denies burning with urination or odor or other UTI symptoms. Denies vaginal dryness. Sees Dr Vertis Kelch every 6 months for medications of Flonase and Nexium, Zanaflex/labs and aex. Still working on weight loss and down now 29 pounds and plans to continue weight watcher's. Has cold today, no fever. Noted ? Hemorrhoid at top of anal opening please check. Denies constipation , black tarry stool or blood in stools, normal.Stopped thyroid medication did not think she needed. No other health issues today.  Patient's last menstrual period was 01/12/2017 (exact date).          Sexually active: No.  Not this year The current method of family planning is abstinence.    Exercising: No.  exercise Smoker:  no  Health Maintenance: Pap:  12-10-13 neg, 03-12-16 neg History of Abnormal Pap: no MMG:  11-19-16 category a density birads 1:neg Self Breast exams: no Colonoscopy:  2015 neg f/u 20yrs BMD:   none TDaP:  2010 Shingles: no Pneumonia: 8/17 Hep C and HIV: not done Labs: upt-neg   reports that  has never smoked. she has never used smokeless tobacco. She reports that she drinks about 1.2 oz of alcohol per week. She reports that she does not use drugs.  Past Medical History:  Diagnosis Date  . Acid reflux   . Anxiety   . Depression   . Hypothyroidism   . Knee osteoarthritis    with degenerative medial meniscus tear  . Meniscus tear   . Osteoarthritis     Past Surgical History:  Procedure Laterality Date  . CHOLECYSTECTOMY    . FOOT SURGERY     bilateral plantar fascitis  . TONSILLECTOMY    . UPPER GI ENDOSCOPY  3/15    Current Outpatient Medications  Medication Sig Dispense Refill  . acetaminophen (TYLENOL) 500 MG tablet Take 1,000 mg by mouth as needed.     . diclofenac (VOLTAREN) 75  MG EC tablet Take 1 tablet (75 mg total) by mouth 2 (two) times daily. (Patient taking differently: Take 75 mg as needed by mouth. ) 50 tablet 2  . EPINEPHrine (EPIPEN) 0.3 mg/0.3 mL IJ SOAJ injection Inject 0.3 mg into the muscle as needed.    . fluticasone (FLONASE) 50 MCG/ACT nasal spray as needed.    Marland Kitchen ibuprofen (ADVIL,MOTRIN) 200 MG tablet Take 800 mg by mouth every 6 (six) hours as needed for pain.    Marland Kitchen ketoconazole (NIZORAL) 2 % cream Apply topically.    Marland Kitchen NEXIUM 40 MG capsule daily.    . tizanidine (ZANAFLEX) 2 MG capsule as needed.     No current facility-administered medications for this visit.     Family History  Problem Relation Age of Onset  . Hypertension Brother   . Cancer Paternal Grandmother        colon  . Liver cancer Paternal Grandmother   . Breast cancer Neg Hx     ROS:  Pertinent items are noted in HPI.  Otherwise, a comprehensive ROS was negative.  Exam:   BP 110/72   Pulse 68   Resp 16   Ht 5' 2.25" (1.581 m)   Wt (!) 314 lb (142.4 kg)   LMP 01/12/2017 (Exact Date)   BMI 56.97 kg/m  Height: 5' 2.25" (158.1 cm) Ht Readings from Last 3 Encounters:  03/16/17 5' 2.25" (1.581 m)  06/09/16 5\' 3"  (1.6 m)  03/12/16 5' 1.75" (1.568 m)    General appearance: alert, cooperative and appears stated age Head: Normocephalic, without obvious abnormality, atraumatic Neck: no adenopathy, supple, symmetrical, trachea midline and thyroid normal to inspection and palpation Lungs: clear to auscultation bilaterally Breasts: normal appearance, no masses or tenderness, No nipple retraction or dimpling, No nipple discharge or bleeding, No axillary or supraclavicular adenopathy Heart: regular rate and rhythm Abdomen: soft, non-tender; no masses,  no organomegaly Extremities: extremities normal, atraumatic, no cyanosis or edema Skin: Skin color, texture, turgor normal. No rashes or lesions Lymph nodes: Cervical, supraclavicular, and axillary nodes normal. No abnormal  inguinal nodes palpated Neurologic: Grossly normal   Pelvic: External genitalia:  no lesions              Urethra:  normal appearing urethra with no masses, tenderness or lesions              Bartholin's and Skene's: normal                 Vagina: normal appearing vagina with normal color and discharge, no lesions              Cervix: no cervical motion tenderness, no lesions and nulliparous appearance              Pap taken: No. Bimanual Exam:  Uterus:  normal size, contour, position, consistency, mobility, non-tender              Adnexa: normal adnexa and no mass, fullness, tenderness, limited by body habitus               Rectovaginal: Confirms               Anus:  normal sphincter tone, no lesions, small non thromobosed hemorrhoid noted  Chaperone present: yes  A:  Well Woman with normal exam  Amenorrhea with negative UPT today  Contraception previous POP, abstinence  Hypothyroid, stopped medication, felt she did not need  Morbid obesity on weight loss down 29 pounds  Hemorrhoid  Asthma with PCP management  P:   Reviewed health and wellness pertinent to exam   Discussed need to evaluate due to no period in 2 1/2 months and history of amenorrhea. Discussed POP worked to prevent hyperplasia with amenorrhea concerns.Patient just wanted to be off her medications. Will need to labs to evaluate thyroid to see if levels are up, pituitary which can also cause period change and FSH which could be perimenopausal. Patient agreeable. May need Provera challenge. Questions addressed. Given information on perimenopausal.  Hemorrhoid noted, etiology discussed and comfort measures given if has any change. Make sure adequate water and fiber intake . Warning signs given.  LAB: TSH, FSH, Prolactin  Pap smear: no   counseled on breast self exam, mammography screening, STD prevention, HIV risk factors and prevention, feminine hygiene, adequate intake of calcium and vitamin D, diet and exercise  return  annually or prn  An After Visit Summary was printed and given to the patient.

## 2017-03-17 ENCOUNTER — Other Ambulatory Visit: Payer: Self-pay | Admitting: Certified Nurse Midwife

## 2017-03-17 ENCOUNTER — Other Ambulatory Visit: Payer: Self-pay

## 2017-03-17 DIAGNOSIS — E039 Hypothyroidism, unspecified: Secondary | ICD-10-CM

## 2017-03-17 LAB — FOLLICLE STIMULATING HORMONE: FSH: 61.8 m[IU]/mL

## 2017-03-17 LAB — PROLACTIN: PROLACTIN: 15.1 ng/mL (ref 4.8–23.3)

## 2017-03-17 LAB — TSH: TSH: 4.22 u[IU]/mL (ref 0.450–4.500)

## 2017-03-17 MED ORDER — MEDROXYPROGESTERONE ACETATE 10 MG PO TABS: 10.0000 mg | ORAL_TABLET | Freq: Every day | ORAL | 0 refills | Status: DC

## 2017-03-17 MED ORDER — LEVOTHYROXINE SODIUM 50 MCG PO TABS: 50.0000 ug | ORAL_TABLET | Freq: Every day | ORAL | 1 refills | Status: DC

## 2017-03-17 NOTE — Telephone Encounter (Signed)
Pt states she would like to restart her thyroid medication & needs a refill. Please send rx & sign rx for provera.

## 2017-03-29 DIAGNOSIS — R0982 Postnasal drip: Secondary | ICD-10-CM | POA: Diagnosis not present

## 2017-03-29 DIAGNOSIS — R05 Cough: Secondary | ICD-10-CM | POA: Diagnosis not present

## 2017-04-05 DIAGNOSIS — D225 Melanocytic nevi of trunk: Secondary | ICD-10-CM | POA: Diagnosis not present

## 2017-04-05 DIAGNOSIS — D1801 Hemangioma of skin and subcutaneous tissue: Secondary | ICD-10-CM | POA: Diagnosis not present

## 2017-04-05 DIAGNOSIS — Z85820 Personal history of malignant melanoma of skin: Secondary | ICD-10-CM | POA: Diagnosis not present

## 2017-04-05 DIAGNOSIS — L814 Other melanin hyperpigmentation: Secondary | ICD-10-CM | POA: Diagnosis not present

## 2017-04-20 DIAGNOSIS — Z23 Encounter for immunization: Secondary | ICD-10-CM | POA: Diagnosis not present

## 2017-04-20 DIAGNOSIS — K219 Gastro-esophageal reflux disease without esophagitis: Secondary | ICD-10-CM | POA: Diagnosis not present

## 2017-04-20 DIAGNOSIS — G5712 Meralgia paresthetica, left lower limb: Secondary | ICD-10-CM | POA: Diagnosis not present

## 2017-04-23 ENCOUNTER — Other Ambulatory Visit: Payer: Self-pay | Admitting: Certified Nurse Midwife

## 2017-04-27 NOTE — Telephone Encounter (Signed)
Patient needs phone call regarding if bleeding from Provera challenge after last visit. She had not had period since 9/18.

## 2017-04-27 NOTE — Telephone Encounter (Signed)
Spoke with patient and gave recommendation per DL. Patient voiced understanding -eh

## 2017-04-27 NOTE — Telephone Encounter (Signed)
Medication refill request: Provera  Last AEX:  03-16-17  Next AEX: 04-12-18  Last MMG (if hormonal medication request): 11-22-16 WNL  Refill authorized: please advise

## 2017-04-27 NOTE — Telephone Encounter (Signed)
Patient does not need to take Provera again this soon if no bleeding with last use. Her [place] indicated menopausal range. So if no bleeding by the end of February needs to advise. If bleeding occurs needs to advise.

## 2017-04-27 NOTE — Telephone Encounter (Signed)
Spoke with patient - patient states she has not had a period since starting the Provera. Advised I would consult with Evalee Mutton, CNM and get back with her-eh

## 2017-06-01 ENCOUNTER — Other Ambulatory Visit: Payer: BLUE CROSS/BLUE SHIELD

## 2017-06-01 ENCOUNTER — Other Ambulatory Visit (INDEPENDENT_AMBULATORY_CARE_PROVIDER_SITE_OTHER): Payer: BLUE CROSS/BLUE SHIELD

## 2017-06-01 DIAGNOSIS — E039 Hypothyroidism, unspecified: Secondary | ICD-10-CM | POA: Diagnosis not present

## 2017-06-02 ENCOUNTER — Other Ambulatory Visit: Payer: Self-pay | Admitting: Certified Nurse Midwife

## 2017-06-02 ENCOUNTER — Telehealth: Payer: Self-pay

## 2017-06-02 DIAGNOSIS — E039 Hypothyroidism, unspecified: Secondary | ICD-10-CM

## 2017-06-02 DIAGNOSIS — E038 Other specified hypothyroidism: Secondary | ICD-10-CM

## 2017-06-02 LAB — THYROID PANEL WITH TSH
Free Thyroxine Index: 2.2 (ref 1.2–4.9)
T3 Uptake Ratio: 25 % (ref 24–39)
T4 TOTAL: 8.7 ug/dL (ref 4.5–12.0)
TSH: 0.793 u[IU]/mL (ref 0.450–4.500)

## 2017-06-02 MED ORDER — LEVOTHYROXINE SODIUM 50 MCG PO TABS: 50.0000 ug | ORAL_TABLET | Freq: Every day | ORAL | 3 refills | Status: DC

## 2017-06-02 NOTE — Telephone Encounter (Signed)
lmtcb

## 2017-06-02 NOTE — Telephone Encounter (Signed)
-----   Message from Regina Eck, CNM sent at 06/02/2017  8:12 AM EST ----- Notify patient Her TSH and thyroid panel is stable. Continue current dosage and repeat in 3 months to make sure not to low, if stable then will just check yearly Order placed, please schedule

## 2017-06-03 ENCOUNTER — Telehealth: Payer: Self-pay

## 2017-06-03 NOTE — Telephone Encounter (Signed)
Pt states she you told her at her aex to let you know if she doesn't get a menstrual cycle. Her last cycle was 01-12-17. Still has had no bleeding. Routed to Johny Shock, CNM

## 2017-06-03 NOTE — Telephone Encounter (Signed)
Patient returned call to Joy. °

## 2017-06-03 NOTE — Telephone Encounter (Signed)
Patient notified of results. See lab 

## 2017-06-07 NOTE — Telephone Encounter (Signed)
She is now menopausal with labs and no response since provera challenge. If she has any bleeding would be considered PMB and needs to advise, this would be a concern.

## 2017-06-07 NOTE — Telephone Encounter (Signed)
Patient notified to call if any bleeding as this would be a concern.

## 2017-08-03 ENCOUNTER — Encounter: Payer: Self-pay | Admitting: Podiatry

## 2017-08-03 ENCOUNTER — Ambulatory Visit: Payer: BLUE CROSS/BLUE SHIELD | Admitting: Podiatry

## 2017-08-03 ENCOUNTER — Ambulatory Visit (INDEPENDENT_AMBULATORY_CARE_PROVIDER_SITE_OTHER): Payer: BLUE CROSS/BLUE SHIELD

## 2017-08-03 DIAGNOSIS — M779 Enthesopathy, unspecified: Secondary | ICD-10-CM

## 2017-08-03 DIAGNOSIS — M7750 Other enthesopathy of unspecified foot: Secondary | ICD-10-CM

## 2017-08-03 DIAGNOSIS — R52 Pain, unspecified: Secondary | ICD-10-CM | POA: Diagnosis not present

## 2017-08-03 DIAGNOSIS — M1812 Unilateral primary osteoarthritis of first carpometacarpal joint, left hand: Secondary | ICD-10-CM | POA: Diagnosis not present

## 2017-08-03 MED ORDER — TRIAMCINOLONE ACETONIDE 10 MG/ML IJ SUSP
10.0000 mg | Freq: Once | INTRAMUSCULAR | Status: AC
  Administered 2017-08-03: 10 mg

## 2017-08-03 NOTE — Progress Notes (Signed)
Dg foo

## 2017-08-03 NOTE — Progress Notes (Signed)
Subjective:   Patient ID: Dominique Mcpherson, female   DOB: 52 y.o.   MRN: [id number]   HPI Patient presents with acute discomfort underneath the left foot stating that it is making it hard for her to walk with lesion formation.  Patient does not remember injury   ROS      Objective:  Physical Exam  Neurovascular status intact with inflammatory changes of the base of the fifth metatarsal left with fluid buildup and keratotic lesions that are painful when palpated     Assessment:  Inflammatory capsulitis base of fifth metatarsal left with keratotic lesion formation that are painful     Plan:  H&P x-rays and conditions reviewed and today I injected the base of the fifth metatarsal left 3 mg Kenalog 5 mg Xylocaine did deep debridement of lesions applied sterile dressing and instructed on recurrence and re-visit if recurrent should occur  X-rays indicate that there is no signs of bone pathology and this appears to be strictly soft tissue

## 2017-08-08 DIAGNOSIS — R350 Frequency of micturition: Secondary | ICD-10-CM | POA: Diagnosis not present

## 2017-08-22 DIAGNOSIS — N390 Urinary tract infection, site not specified: Secondary | ICD-10-CM | POA: Diagnosis not present

## 2017-09-06 ENCOUNTER — Telehealth: Payer: Self-pay | Admitting: Certified Nurse Midwife

## 2017-09-06 NOTE — Telephone Encounter (Signed)
Patient called and said she thinks she may have a urinary tract infection. She declined to schedule an appointment for today and scheduled an appointment for tomorrow morning at 10:00 AM with Melvia Heaps, CNM. Routing to triage for review or further assessment of patient.

## 2017-09-06 NOTE — Telephone Encounter (Signed)
Spoke with patient. Reports urinary frequency, urgency, voiding small amounts and dysuria. Reports small amount of blood with wiping, is unsure if this is her menses. LMP 07/24/17, last menses prior was September 2018. Denies fever/chills, N/V, lower back pain.  Last [facility] 03/16/17 indicated menopause, no bleeding after provera.   Patient previously scheduled for OV on 5/8 with Melvia Heaps, CNM. OV offered for today, patient declined d/t work schedule. ER/Urgent Care precautions reviewed.   Routing to provider for final review. Patient is agreeable to disposition. Will close encounter.

## 2017-09-07 ENCOUNTER — Encounter: Payer: Self-pay | Admitting: Certified Nurse Midwife

## 2017-09-07 ENCOUNTER — Ambulatory Visit: Payer: BLUE CROSS/BLUE SHIELD | Admitting: Certified Nurse Midwife

## 2017-09-07 VITALS — BP 122/84 | HR 76 | Ht 62.25 in | Wt 303.2 lb

## 2017-09-07 DIAGNOSIS — R300 Dysuria: Secondary | ICD-10-CM | POA: Diagnosis not present

## 2017-09-07 DIAGNOSIS — N3090 Cystitis, unspecified without hematuria: Secondary | ICD-10-CM | POA: Diagnosis not present

## 2017-09-07 DIAGNOSIS — R319 Hematuria, unspecified: Secondary | ICD-10-CM | POA: Diagnosis not present

## 2017-09-07 LAB — POCT URINALYSIS DIPSTICK
BILIRUBIN UA: NEGATIVE
GLUCOSE UA: NEGATIVE
Ketones, UA: NEGATIVE
Nitrite, UA: NEGATIVE
Protein, UA: NEGATIVE
Urobilinogen, UA: 0.2 E.U./dL
pH, UA: 5 (ref 5.0–8.0)

## 2017-09-07 MED ORDER — SULFAMETHOXAZOLE-TRIMETHOPRIM 800-160 MG PO TABS: 1.0000 | ORAL_TABLET | Freq: Two times a day (BID) | ORAL | 0 refills | Status: DC

## 2017-09-07 NOTE — Patient Instructions (Signed)

## 2017-09-07 NOTE — Progress Notes (Signed)
52 y.o. Single Caucasian female G0P0000 here with complaint of UTI, with onset  on 5-6 days ago. Patient complaining of urinary frequency/urgency/ and burning with urination. Patient denies fever, chills, nausea or back pain. No new personal products. Patient not sexually active.. Denies any vaginal symptoms of itching or burning or change in discharge. Patient has been treating crease between buttocks with yeast cream given my dermatology when itching. Please check.  Contraception is none.Patient had   Period on 07/03/17 which lasted 7-8 days. Previous cycle was 9/19 which was normal. Stopped POP due to no cycles, Previous Provera use in 03/17/17 with no withdrawal bleeding. No other health issues today.  Review of Systems  Constitutional: Negative.   HENT: Negative.   Eyes: Negative.   Respiratory: Negative.   Cardiovascular: Negative.   Gastrointestinal: Negative.   Genitourinary: Positive for dysuria, frequency and urgency.  Skin: Negative.   Psychiatric/Behavioral: Negative.     O: Healthy female WDWN Affect: Normal, orientation x 3 Skin : warm and dry CVAT: negative bilateral Abdomen: positive for suprapubic tenderness  Pelvic exam: External genital area: normal, no lesions Bladder,Urethra tender, Urethral meatus: tender, red Vagina: normal vaginal discharge, normal appearance   Cervix: normal, non tender Uterus:normal,non tender Adnexa: normal non tender, no fullness or masses Anal area and crease between buttocks red with scaling, cream residue from patient treating.     A: UTI Normal pelvic exam History of yeast dermatitis  P: Reviewed findings of UTI and need for treatment. Rx: Bactrim DS see order with instructions.  MMN:OTRRN micro, culture Reviewed warning signs and symptoms of UTI and need to advise if occurring. Discussed being careful when cleaning after bowel movement which can increase UTI occurrence. Encouraged to limit soda, tea, and coffee and be sure to  increase water intake. Discussed keeping area between buttocks dry and dusting with cornstarch to avoid skin break down once redness is cleared with medication use. Questions addressed. Needs to advise if any vaginal bleeding. RV prn

## 2017-09-08 DIAGNOSIS — F331 Major depressive disorder, recurrent, moderate: Secondary | ICD-10-CM | POA: Diagnosis not present

## 2017-09-08 LAB — URINALYSIS, MICROSCOPIC ONLY: CASTS: NONE SEEN /LPF

## 2017-09-08 NOTE — Progress Notes (Signed)
I think she needs a PUS to evaluate the endometrium.  I would recommend a biopsy if it was thickened.  Thanks.

## 2017-09-09 ENCOUNTER — Other Ambulatory Visit: Payer: Self-pay | Admitting: *Deleted

## 2017-09-09 ENCOUNTER — Other Ambulatory Visit: Payer: Self-pay | Admitting: Certified Nurse Midwife

## 2017-09-09 DIAGNOSIS — N939 Abnormal uterine and vaginal bleeding, unspecified: Secondary | ICD-10-CM

## 2017-09-09 LAB — URINE CULTURE

## 2017-09-15 DIAGNOSIS — F331 Major depressive disorder, recurrent, moderate: Secondary | ICD-10-CM | POA: Diagnosis not present

## 2017-10-03 ENCOUNTER — Telehealth (INDEPENDENT_AMBULATORY_CARE_PROVIDER_SITE_OTHER): Payer: Self-pay | Admitting: Orthopedic Surgery

## 2017-10-03 ENCOUNTER — Telehealth: Payer: Self-pay | Admitting: Obstetrics & Gynecology

## 2017-10-03 NOTE — Telephone Encounter (Signed)
Called pt left VM to call back.  

## 2017-10-03 NOTE — Telephone Encounter (Signed)
Synvisc one request  10/07/17

## 2017-10-03 NOTE — Telephone Encounter (Signed)
Patient called to cancel ultrasound scheduled on 10/18/17, stating she has had a death in her family and will be out of town. Patient rescheduled appointment on 10/13/17 with Dr Sabra Heck. Patient is aware of the appointment date, arrival time and cancellation policy. Patient had no further questions.  Routing to provider for final review. Patient agreeable to disposition. Will close encounter.  Routing to Dr Sabra Heck

## 2017-10-03 NOTE — Telephone Encounter (Signed)
Chenise,  Can you please call the patient back and let her know that we are happy to see her on 10/07/17, however, we cannot promise that she will be able to get the synvisc one injection on that day because we have to submit information to her insurance to verify benefits to see if they even cover the injection.  That only gives [place] 3 days to get all information and with the volume of patients getting these injections, it is not likely that we will have this verified prior to her appointment time. We really should cancel her appt until we get information verified (especially if all she is wanting is the gel injection).  There are patients who have had these in the past and had approved by insurance, but now insurance is denying the claims for gel injections.   With that being said, any patient that is requesting gel injections should just have a message taken and sent to April as well as the assistant and not scheduling until after we know whether or not the insurance that they have will cover. Also in this case, since patient has not been seen in over a year, it would be beneficial to verify insurance information/coverage in case there have been any changes. Let me know if you need me to help with anything or if this does not make sense.

## 2017-10-04 ENCOUNTER — Telehealth (INDEPENDENT_AMBULATORY_CARE_PROVIDER_SITE_OTHER): Payer: Self-pay

## 2017-10-04 NOTE — Telephone Encounter (Signed)
Noted.  Will apply.

## 2017-10-04 NOTE — Telephone Encounter (Signed)
Ok! Sorry tried to get back to you

## 2017-10-04 NOTE — Telephone Encounter (Signed)
I called patient and spoke with her and explained. She cancelled appointment for Friday and will wait until we get auth from insurance. I advised her that either April or myself will call her to schedule appt for gel injection.

## 2017-10-04 NOTE — Telephone Encounter (Signed)
Submitted application online for SynviscOne injection, left knee.  

## 2017-10-04 NOTE — Telephone Encounter (Signed)
Do you know if patient has called back yet about appointment?

## 2017-10-05 ENCOUNTER — Telehealth (INDEPENDENT_AMBULATORY_CARE_PROVIDER_SITE_OTHER): Payer: Self-pay

## 2017-10-05 DIAGNOSIS — M79645 Pain in left finger(s): Secondary | ICD-10-CM | POA: Diagnosis not present

## 2017-10-05 DIAGNOSIS — M1812 Unilateral primary osteoarthritis of first carpometacarpal joint, left hand: Secondary | ICD-10-CM | POA: Diagnosis not present

## 2017-10-05 NOTE — Telephone Encounter (Signed)
PA required for SynviscOne, left knee ([id number]).

## 2017-10-06 ENCOUNTER — Other Ambulatory Visit: Payer: Self-pay | Admitting: Obstetrics & Gynecology

## 2017-10-06 ENCOUNTER — Other Ambulatory Visit: Payer: Self-pay

## 2017-10-07 ENCOUNTER — Ambulatory Visit (INDEPENDENT_AMBULATORY_CARE_PROVIDER_SITE_OTHER): Payer: BLUE CROSS/BLUE SHIELD | Admitting: Orthopedic Surgery

## 2017-10-12 NOTE — Telephone Encounter (Signed)
IC patient and advised PA is needed.  I have requested on line at [employer] for them to upload PA form for [place].  I will submit for PA, and I tentatively sched patient for Wed 10/19/17 at 215 pm for this injection.

## 2017-10-12 NOTE — Telephone Encounter (Signed)
Patient called to check on approval for synvisc one injection. Please call # [phone]

## 2017-10-12 NOTE — Telephone Encounter (Signed)
Form submitted for PA.

## 2017-10-13 ENCOUNTER — Encounter: Payer: Self-pay | Admitting: Obstetrics & Gynecology

## 2017-10-13 ENCOUNTER — Ambulatory Visit: Payer: BLUE CROSS/BLUE SHIELD | Admitting: Obstetrics & Gynecology

## 2017-10-13 ENCOUNTER — Ambulatory Visit (INDEPENDENT_AMBULATORY_CARE_PROVIDER_SITE_OTHER): Payer: BLUE CROSS/BLUE SHIELD

## 2017-10-13 VITALS — BP 126/86 | HR 72 | Resp 16 | Ht 62.25 in | Wt 310.0 lb

## 2017-10-13 DIAGNOSIS — N951 Menopausal and female climacteric states: Secondary | ICD-10-CM

## 2017-10-13 DIAGNOSIS — F331 Major depressive disorder, recurrent, moderate: Secondary | ICD-10-CM | POA: Diagnosis not present

## 2017-10-13 DIAGNOSIS — N939 Abnormal uterine and vaginal bleeding, unspecified: Secondary | ICD-10-CM | POA: Diagnosis not present

## 2017-10-13 DIAGNOSIS — R519 Headache, unspecified: Secondary | ICD-10-CM

## 2017-10-13 DIAGNOSIS — R51 Headache: Secondary | ICD-10-CM | POA: Diagnosis not present

## 2017-10-13 MED ORDER — VALACYCLOVIR HCL 1 G PO TABS: 1000.0000 mg | ORAL_TABLET | Freq: Three times a day (TID) | ORAL | 0 refills | Status: DC

## 2017-10-13 NOTE — Progress Notes (Signed)
52 y.o. G0P0000 Single Caucasian female here for pelvic ultrasound due to irregular bleeding.  Saw Melvia Heaps, CNM, on 09/07/17.  Cycles have been irregular over the past year.  Has done a provera challenge in November and did not have any bleeding.  She did have a 7-8 day cycle in March.  Ultrasound was recommended and possible endometrial biopsy as well.  [place] [address] and obtained 03/16/18.  Denies any hormonal symptoms--breast tenderness, pelvic cramping, acne.  Pt does report a new symptom of pain in her left ear and skin burning on her left scalp.  No skin changes.  Patient's last menstrual period was 07/13/2017.  Contraception: abstinence   Findings:  UTERUS: 6.9 x 3.6 x 3.0cm EMS: 2.76mm ADNEXA: Left ovary: 3.1 x 1.9 x 1.2cm with 2.1 x 8.3GP follicle       Right ovary: 3.1 x 1.9 x 1.5cm with 1.5 x 4.9IY follicle CUL DE SAC:  No free fluid  Discussion:  Findings reviewed.  Endometrium is thin.  She does have ovarian follicles so may have future bleeding.  However, with [place], she is close to menopause.  Advised pt I do not think she needs a biopsy as endometrium is 2.81mm and she is having no current bleeding.  Advised she needs to call when any bleeding in the future.  Would need to consider a possible biopsy at that time but do not feel this is needed right now.    Exam: Gen:  WNWD WF, NAD HEENT:  Right ear exam is normal, skin is normal  Assessment:  Early menopausal, normal ultrasound Ear pain, possible shingles  Plan:  Pt knows to call with any bleeding.  Would repeat [place] then and decide if endometrial biopsy was needed at at that time.   Valtrex 1gm TID x 7 days.  #21.  May need follow-up with PCP if symptoms continue.  ~20 minutes spent with patient >50% of time was in face to face discussion of above.

## 2017-10-18 ENCOUNTER — Telehealth (INDEPENDENT_AMBULATORY_CARE_PROVIDER_SITE_OTHER): Payer: Self-pay

## 2017-10-18 ENCOUNTER — Telehealth (INDEPENDENT_AMBULATORY_CARE_PROVIDER_SITE_OTHER): Payer: Self-pay | Admitting: Radiology

## 2017-10-18 NOTE — Telephone Encounter (Signed)
Called and left VM advising Maralese that completed PA form(2 pages) was faxed to [phone].  Faxed PA forms to [phone].

## 2017-10-18 NOTE — Telephone Encounter (Signed)
Please call [employer] to advise we are sending in the Initial Criteria form, ref #[id number], for Synvisc One auth.

## 2017-10-18 NOTE — Telephone Encounter (Signed)
Called and left Vm advising Maralese that PA form was faxed.

## 2017-10-19 ENCOUNTER — Encounter (INDEPENDENT_AMBULATORY_CARE_PROVIDER_SITE_OTHER): Payer: Self-pay | Admitting: Orthopedic Surgery

## 2017-10-19 ENCOUNTER — Ambulatory Visit (INDEPENDENT_AMBULATORY_CARE_PROVIDER_SITE_OTHER): Payer: BLUE CROSS/BLUE SHIELD | Admitting: Orthopedic Surgery

## 2017-10-19 ENCOUNTER — Telehealth (INDEPENDENT_AMBULATORY_CARE_PROVIDER_SITE_OTHER): Payer: Self-pay

## 2017-10-19 DIAGNOSIS — M1712 Unilateral primary osteoarthritis, left knee: Secondary | ICD-10-CM

## 2017-10-19 DIAGNOSIS — F331 Major depressive disorder, recurrent, moderate: Secondary | ICD-10-CM | POA: Diagnosis not present

## 2017-10-19 NOTE — Telephone Encounter (Signed)
Patient approved for SynviscOne, left knee. PA approved, Valid 10/12/2017- 10/12/2018 Reference# [id number] Covered at 100% Buy & Bill

## 2017-10-22 ENCOUNTER — Encounter (INDEPENDENT_AMBULATORY_CARE_PROVIDER_SITE_OTHER): Payer: Self-pay | Admitting: Orthopedic Surgery

## 2017-10-22 DIAGNOSIS — M1712 Unilateral primary osteoarthritis, left knee: Secondary | ICD-10-CM | POA: Diagnosis not present

## 2017-10-22 MED ORDER — LIDOCAINE HCL 1 % IJ SOLN
5.0000 mL | INTRAMUSCULAR | Status: AC | PRN
  Administered 2017-10-22: 5 mL

## 2017-10-22 MED ORDER — HYLAN G-F 20 48 MG/6ML IX SOSY
48.0000 mg | PREFILLED_SYRINGE | INTRA_ARTICULAR | Status: AC | PRN
  Administered 2017-10-22: 48 mg via INTRA_ARTICULAR

## 2017-10-22 NOTE — Progress Notes (Signed)
   Procedure Note  Patient: Dominique Mcpherson             Date of Birth: 09-30-1965           MRN: [id number]             Visit Date: 10/19/2017  Procedures: Visit Diagnoses: Unilateral primary osteoarthritis, left knee  Large Joint Inj: L knee on 10/22/2017 8:33 AM Indications: pain, joint swelling and diagnostic evaluation Details: 18 G 1.5 in needle, superolateral approach  Arthrogram: No  Medications: 5 mL lidocaine 1 %; 48 mg Hylan 48 MG/6ML Outcome: tolerated well, no immediate complications Procedure, treatment alternatives, risks and benefits explained, specific risks discussed. Consent was given by the patient. Immediately prior to procedure a time out was called to verify the correct patient, procedure, equipment, support staff and site/side marked as required. Patient was prepped and draped in the usual sterile fashion.

## 2017-10-27 ENCOUNTER — Other Ambulatory Visit: Payer: Self-pay | Admitting: Internal Medicine

## 2017-10-27 DIAGNOSIS — Z0000 Encounter for general adult medical examination without abnormal findings: Secondary | ICD-10-CM | POA: Diagnosis not present

## 2017-10-27 DIAGNOSIS — Z1231 Encounter for screening mammogram for malignant neoplasm of breast: Secondary | ICD-10-CM

## 2017-10-27 DIAGNOSIS — Z13220 Encounter for screening for lipoid disorders: Secondary | ICD-10-CM | POA: Diagnosis not present

## 2017-10-27 DIAGNOSIS — F331 Major depressive disorder, recurrent, moderate: Secondary | ICD-10-CM | POA: Diagnosis not present

## 2017-11-08 IMAGING — CR DG HIP (WITH OR WITHOUT PELVIS) 2-3V*R*
2 series · 2 of 2 positions shown · non-contrast
Comparison: Abdominal and pelvic CT images from January 17, 2010

CLINICAL DATA: Three weeks of right hip pain without known injury;
history of morbid obesity, osteoarthritis of the knee.

EXAM:
DG HIP (WITH OR WITHOUT PELVIS) 2-3V RIGHT

[t hip ap right *]
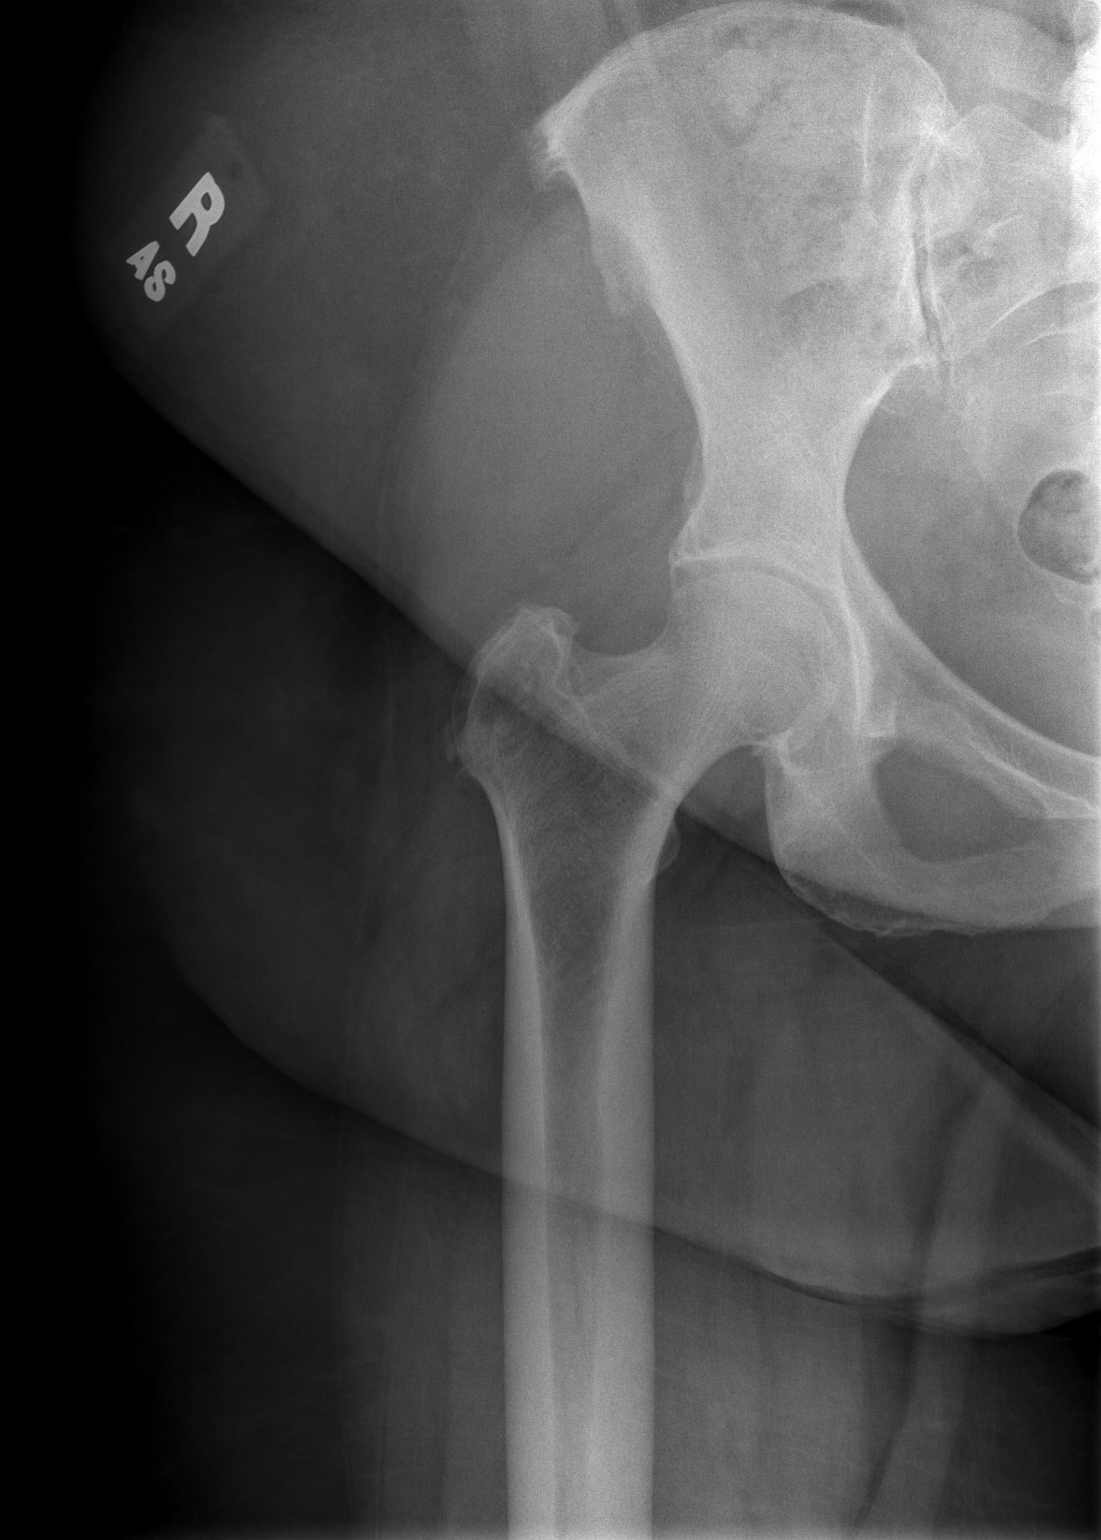

[t hip frog leg right *]
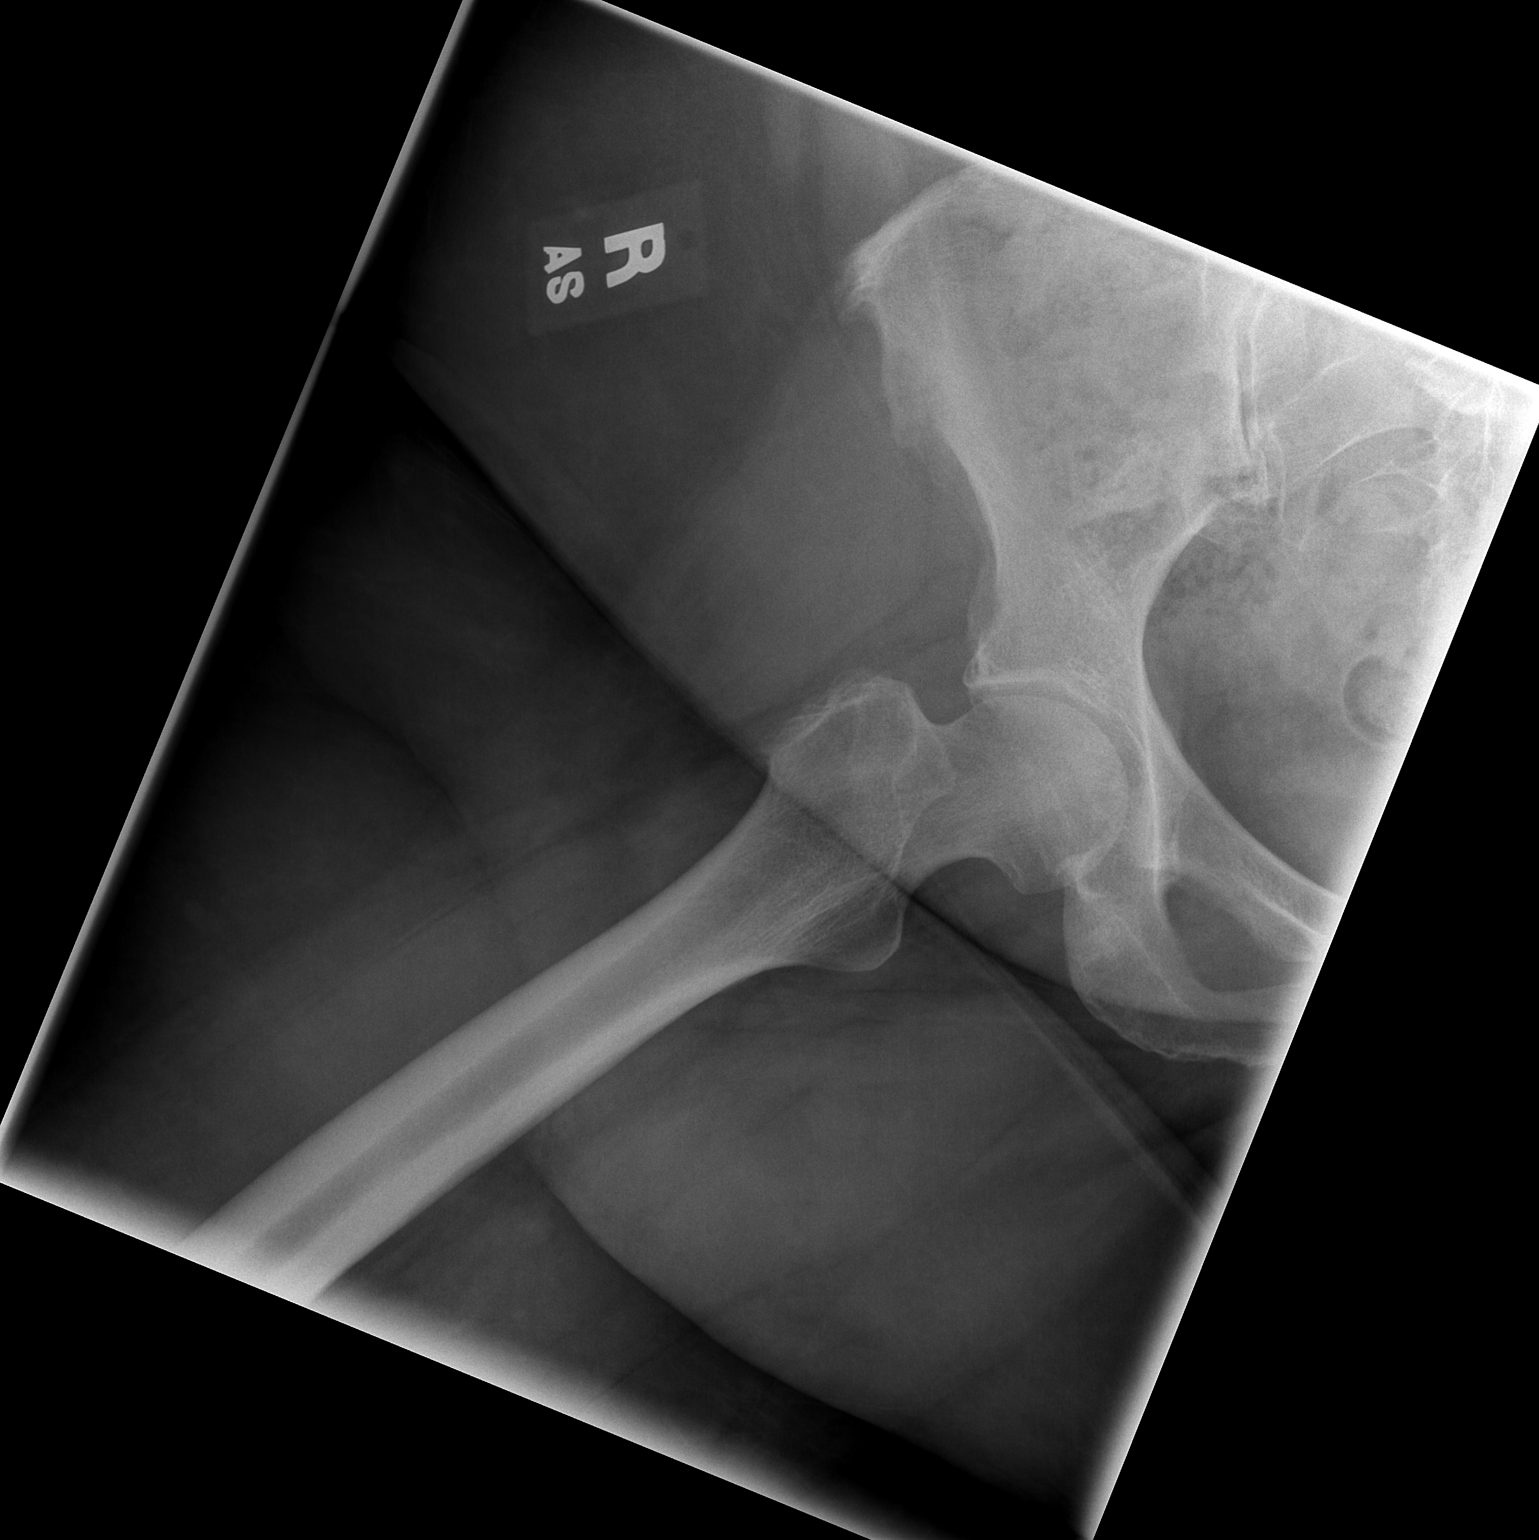

[2 of 2 positions shown; findings below may reference images not displayed]

FINDINGS: The observed portions of the pelvis exhibit no lytic or blastic
lesions. No a acute or old fracture is observed. There is subjective
osteopenia. AP and lateral views of the hip joint reveal mild
symmetric narrowing. The articular surfaces of the femoral head and
acetabulum remain smoothly rounded. The femoral neck,
intertrochanteric, and subtrochanteric regions of the right femur
are normal.
IMPRESSION: Mild degenerative change of the right hip which appears stable.
There is no acute bony abnormality.

## 2017-11-17 DIAGNOSIS — F331 Major depressive disorder, recurrent, moderate: Secondary | ICD-10-CM | POA: Diagnosis not present

## 2017-11-24 ENCOUNTER — Ambulatory Visit
Admission: RE | Admit: 2017-11-24 | Discharge: 2017-11-24 | Disposition: A | Discharge status: Home / Self Care | Payer: BLUE CROSS/BLUE SHIELD | Source: Ambulatory Visit | Attending: Internal Medicine | Admitting: Internal Medicine

## 2017-11-24 DIAGNOSIS — Z1231 Encounter for screening mammogram for malignant neoplasm of breast: Secondary | ICD-10-CM

## 2017-11-24 DIAGNOSIS — F331 Major depressive disorder, recurrent, moderate: Secondary | ICD-10-CM | POA: Diagnosis not present

## 2017-12-01 DIAGNOSIS — F321 Major depressive disorder, single episode, moderate: Secondary | ICD-10-CM | POA: Diagnosis not present

## 2017-12-01 DIAGNOSIS — F331 Major depressive disorder, recurrent, moderate: Secondary | ICD-10-CM | POA: Diagnosis not present

## 2017-12-01 DIAGNOSIS — M1990 Unspecified osteoarthritis, unspecified site: Secondary | ICD-10-CM | POA: Diagnosis not present

## 2017-12-01 DIAGNOSIS — Z6841 Body Mass Index (BMI) 40.0 and over, adult: Secondary | ICD-10-CM | POA: Diagnosis not present

## 2017-12-01 DIAGNOSIS — E039 Hypothyroidism, unspecified: Secondary | ICD-10-CM | POA: Diagnosis not present

## 2017-12-08 DIAGNOSIS — F331 Major depressive disorder, recurrent, moderate: Secondary | ICD-10-CM | POA: Diagnosis not present

## 2017-12-09 DIAGNOSIS — L814 Other melanin hyperpigmentation: Secondary | ICD-10-CM | POA: Diagnosis not present

## 2017-12-09 DIAGNOSIS — D225 Melanocytic nevi of trunk: Secondary | ICD-10-CM | POA: Diagnosis not present

## 2017-12-09 DIAGNOSIS — L821 Other seborrheic keratosis: Secondary | ICD-10-CM | POA: Diagnosis not present

## 2017-12-09 DIAGNOSIS — D1801 Hemangioma of skin and subcutaneous tissue: Secondary | ICD-10-CM | POA: Diagnosis not present

## 2017-12-21 DIAGNOSIS — F331 Major depressive disorder, recurrent, moderate: Secondary | ICD-10-CM | POA: Diagnosis not present

## 2018-01-04 DIAGNOSIS — F331 Major depressive disorder, recurrent, moderate: Secondary | ICD-10-CM | POA: Diagnosis not present

## 2018-01-11 DIAGNOSIS — E039 Hypothyroidism, unspecified: Secondary | ICD-10-CM | POA: Diagnosis not present

## 2018-01-11 DIAGNOSIS — Z713 Dietary counseling and surveillance: Secondary | ICD-10-CM | POA: Diagnosis not present

## 2018-01-11 DIAGNOSIS — F331 Major depressive disorder, recurrent, moderate: Secondary | ICD-10-CM | POA: Diagnosis not present

## 2018-01-11 DIAGNOSIS — Z23 Encounter for immunization: Secondary | ICD-10-CM | POA: Diagnosis not present

## 2018-01-18 DIAGNOSIS — F331 Major depressive disorder, recurrent, moderate: Secondary | ICD-10-CM | POA: Diagnosis not present

## 2018-01-25 DIAGNOSIS — F331 Major depressive disorder, recurrent, moderate: Secondary | ICD-10-CM | POA: Diagnosis not present

## 2018-02-01 DIAGNOSIS — F331 Major depressive disorder, recurrent, moderate: Secondary | ICD-10-CM | POA: Diagnosis not present

## 2018-02-08 DIAGNOSIS — F331 Major depressive disorder, recurrent, moderate: Secondary | ICD-10-CM | POA: Diagnosis not present

## 2018-02-15 DIAGNOSIS — F331 Major depressive disorder, recurrent, moderate: Secondary | ICD-10-CM | POA: Diagnosis not present

## 2018-02-22 DIAGNOSIS — F331 Major depressive disorder, recurrent, moderate: Secondary | ICD-10-CM | POA: Diagnosis not present

## 2018-03-08 DIAGNOSIS — F331 Major depressive disorder, recurrent, moderate: Secondary | ICD-10-CM | POA: Diagnosis not present

## 2018-03-15 DIAGNOSIS — F331 Major depressive disorder, recurrent, moderate: Secondary | ICD-10-CM | POA: Diagnosis not present

## 2018-03-22 DIAGNOSIS — F331 Major depressive disorder, recurrent, moderate: Secondary | ICD-10-CM | POA: Diagnosis not present

## 2018-03-22 DIAGNOSIS — R51 Headache: Secondary | ICD-10-CM | POA: Diagnosis not present

## 2018-03-28 DIAGNOSIS — H93293 Other abnormal auditory perceptions, bilateral: Secondary | ICD-10-CM | POA: Diagnosis not present

## 2018-04-05 DIAGNOSIS — F331 Major depressive disorder, recurrent, moderate: Secondary | ICD-10-CM | POA: Diagnosis not present

## 2018-04-10 DIAGNOSIS — H1031 Unspecified acute conjunctivitis, right eye: Secondary | ICD-10-CM | POA: Diagnosis not present

## 2018-04-10 DIAGNOSIS — K219 Gastro-esophageal reflux disease without esophagitis: Secondary | ICD-10-CM | POA: Diagnosis not present

## 2018-04-10 DIAGNOSIS — E039 Hypothyroidism, unspecified: Secondary | ICD-10-CM | POA: Diagnosis not present

## 2018-04-11 DIAGNOSIS — H40039 Anatomical narrow angle, unspecified eye: Secondary | ICD-10-CM | POA: Diagnosis not present

## 2018-04-11 DIAGNOSIS — E0500 Thyrotoxicosis with diffuse goiter without thyrotoxic crisis or storm: Secondary | ICD-10-CM | POA: Diagnosis not present

## 2018-04-12 ENCOUNTER — Ambulatory Visit: Payer: BLUE CROSS/BLUE SHIELD | Admitting: Certified Nurse Midwife

## 2018-04-12 DIAGNOSIS — H40039 Anatomical narrow angle, unspecified eye: Secondary | ICD-10-CM | POA: Diagnosis not present

## 2018-04-18 DIAGNOSIS — E039 Hypothyroidism, unspecified: Secondary | ICD-10-CM | POA: Diagnosis not present

## 2018-04-18 DIAGNOSIS — H1031 Unspecified acute conjunctivitis, right eye: Secondary | ICD-10-CM | POA: Diagnosis not present

## 2018-04-18 DIAGNOSIS — K219 Gastro-esophageal reflux disease without esophagitis: Secondary | ICD-10-CM | POA: Diagnosis not present

## 2018-04-21 DIAGNOSIS — K219 Gastro-esophageal reflux disease without esophagitis: Secondary | ICD-10-CM | POA: Diagnosis not present

## 2018-04-21 DIAGNOSIS — E039 Hypothyroidism, unspecified: Secondary | ICD-10-CM | POA: Diagnosis not present

## 2018-05-05 DIAGNOSIS — H04039 Chronic enlargement of unspecified lacrimal gland: Secondary | ICD-10-CM | POA: Diagnosis not present

## 2018-05-10 DIAGNOSIS — F331 Major depressive disorder, recurrent, moderate: Secondary | ICD-10-CM | POA: Diagnosis not present

## 2018-05-10 DIAGNOSIS — H43813 Vitreous degeneration, bilateral: Secondary | ICD-10-CM | POA: Diagnosis not present

## 2018-05-17 DIAGNOSIS — F331 Major depressive disorder, recurrent, moderate: Secondary | ICD-10-CM | POA: Diagnosis not present

## 2018-06-07 ENCOUNTER — Ambulatory Visit: Payer: BLUE CROSS/BLUE SHIELD | Admitting: Certified Nurse Midwife

## 2018-06-07 DIAGNOSIS — F331 Major depressive disorder, recurrent, moderate: Secondary | ICD-10-CM | POA: Diagnosis not present

## 2018-06-14 DIAGNOSIS — F331 Major depressive disorder, recurrent, moderate: Secondary | ICD-10-CM | POA: Diagnosis not present

## 2018-06-21 DIAGNOSIS — F331 Major depressive disorder, recurrent, moderate: Secondary | ICD-10-CM | POA: Diagnosis not present

## 2018-07-05 ENCOUNTER — Ambulatory Visit: Payer: BLUE CROSS/BLUE SHIELD | Admitting: Certified Nurse Midwife

## 2018-07-05 DIAGNOSIS — F331 Major depressive disorder, recurrent, moderate: Secondary | ICD-10-CM | POA: Diagnosis not present

## 2018-07-12 DIAGNOSIS — F331 Major depressive disorder, recurrent, moderate: Secondary | ICD-10-CM | POA: Diagnosis not present

## 2018-07-27 DIAGNOSIS — F331 Major depressive disorder, recurrent, moderate: Secondary | ICD-10-CM | POA: Diagnosis not present

## 2018-08-03 DIAGNOSIS — F331 Major depressive disorder, recurrent, moderate: Secondary | ICD-10-CM | POA: Diagnosis not present

## 2018-08-10 DIAGNOSIS — F331 Major depressive disorder, recurrent, moderate: Secondary | ICD-10-CM | POA: Diagnosis not present

## 2018-08-17 DIAGNOSIS — F331 Major depressive disorder, recurrent, moderate: Secondary | ICD-10-CM | POA: Diagnosis not present

## 2018-08-24 DIAGNOSIS — F331 Major depressive disorder, recurrent, moderate: Secondary | ICD-10-CM | POA: Diagnosis not present

## 2018-08-31 DIAGNOSIS — F331 Major depressive disorder, recurrent, moderate: Secondary | ICD-10-CM | POA: Diagnosis not present

## 2018-09-07 DIAGNOSIS — F331 Major depressive disorder, recurrent, moderate: Secondary | ICD-10-CM | POA: Diagnosis not present

## 2018-09-13 DIAGNOSIS — F419 Anxiety disorder, unspecified: Secondary | ICD-10-CM | POA: Diagnosis not present

## 2018-09-13 DIAGNOSIS — F902 Attention-deficit hyperactivity disorder, combined type: Secondary | ICD-10-CM | POA: Diagnosis not present

## 2018-09-13 DIAGNOSIS — R41840 Attention and concentration deficit: Secondary | ICD-10-CM | POA: Diagnosis not present

## 2018-09-13 DIAGNOSIS — Z79899 Other long term (current) drug therapy: Secondary | ICD-10-CM | POA: Diagnosis not present

## 2018-09-13 DIAGNOSIS — F331 Major depressive disorder, recurrent, moderate: Secondary | ICD-10-CM | POA: Diagnosis not present

## 2018-09-22 ENCOUNTER — Ambulatory Visit: Payer: BLUE CROSS/BLUE SHIELD | Admitting: Certified Nurse Midwife

## 2018-10-12 DIAGNOSIS — F331 Major depressive disorder, recurrent, moderate: Secondary | ICD-10-CM | POA: Diagnosis not present

## 2018-10-25 DIAGNOSIS — F902 Attention-deficit hyperactivity disorder, combined type: Secondary | ICD-10-CM | POA: Diagnosis not present

## 2018-10-25 DIAGNOSIS — Z79899 Other long term (current) drug therapy: Secondary | ICD-10-CM | POA: Diagnosis not present

## 2018-10-26 DIAGNOSIS — F331 Major depressive disorder, recurrent, moderate: Secondary | ICD-10-CM | POA: Diagnosis not present

## 2018-11-02 ENCOUNTER — Ambulatory Visit: Payer: BC Managed Care – PPO | Admitting: Obstetrics & Gynecology

## 2018-11-02 ENCOUNTER — Other Ambulatory Visit: Payer: Self-pay

## 2018-11-02 ENCOUNTER — Encounter: Payer: Self-pay | Admitting: Obstetrics & Gynecology

## 2018-11-02 ENCOUNTER — Telehealth: Payer: Self-pay | Admitting: Certified Nurse Midwife

## 2018-11-02 VITALS — BP 112/70 | HR 84 | Temp 97.3°F | Ht 62.25 in | Wt 300.0 lb

## 2018-11-02 DIAGNOSIS — N764 Abscess of vulva: Secondary | ICD-10-CM | POA: Diagnosis not present

## 2018-11-02 MED ORDER — TRIAMCINOLONE ACETONIDE 0.5 % EX CREA: 1.0000 "application " | TOPICAL_CREAM | Freq: Three times a day (TID) | CUTANEOUS | 0 refills | Status: DC

## 2018-11-02 MED ORDER — SULFAMETHOXAZOLE-TRIMETHOPRIM 800-160 MG PO TABS: 1.0000 | ORAL_TABLET | Freq: Two times a day (BID) | ORAL | 0 refills | Status: DC

## 2018-11-02 NOTE — Telephone Encounter (Signed)
Spoke with patient. Patient reports pain in vagina for the past few days. Noticed a hard, red bump on right side of labia this morning. Is unsure if fluid filled, no drainage. Approximately size of tip of pinky finger. Denies fever/chills, redness, itching, vag d/c, odor or bleeding. Patient anxious, requesting OV today. Covid 19 prescreen negative, precautions reviewed. OV scheduled for today at 4pm with Dr. Sabra Heck.  Patient verbalizes understanding and is agreeable.   Encounter closed.

## 2018-11-02 NOTE — Progress Notes (Signed)
GYNECOLOGY  VISIT  CC:   Vaginal lump x 2 days  HPI: 53 y.o. G0P0000 Single White or Caucasian female here for vaginal lump that she noticed on Sunday.  She wondered if she was a little irritated due to her underwear.  Denies vaginal drainage and/or bleeding.  It is much bigger today.  She denies fever.  She also has a few red spots on her legs that started out as itching places almost like bites.  After several days, the areas seem to flatten and are less red larger.  She tried some benadryl cream on the areas and this really didn't make any difference.    GYNECOLOGIC HISTORY: Patient's last menstrual period was 07/01/2017 (approximate). Contraception: PMP Menopausal hormone therapy: none  Patient Active Problem List   Diagnosis Date Noted  . Left elbow pain 06/11/2016  . Achilles tendon pain 06/11/2016  . Melanoma of upper arm ([place]) 05/09/2013  . Morbid obesity ([place]) 11/29/2012    Past Medical History:  Diagnosis Date  . Acid reflux   . Anxiety   . Depression   . Hypothyroidism   . Knee osteoarthritis    with degenerative medial meniscus tear  . Meniscus tear   . Osteoarthritis     Past Surgical History:  Procedure Laterality Date  . CHOLECYSTECTOMY    . FOOT SURGERY     bilateral plantar fascitis  . HYSTEROSCOPY W/D&C N/A 01/31/2013   Procedure: DILATATION AND CURETTAGE  TRU CLEAR /HYSTEROSCOPY;  Surgeon: Azalia Bilis, MD;  Location: [facility];  Service: Gynecology;  Laterality: N/A;  . MELANOMA EXCISION Right 05/17/2013   Procedure: WIDE EXCISION MELANOMA RIGHT UPPER ARM;  Surgeon: Harl Bowie, MD;  Location: [facility];  Service: General;  Laterality: Right;  . TONSILLECTOMY    . UPPER GI ENDOSCOPY  3/15    MEDS:   Current Outpatient Medications on File Prior to Visit  Medication Sig Dispense Refill  . acetaminophen (TYLENOL) 500 MG tablet Take 1,000 mg by mouth as needed.     Marland Kitchen buPROPion (WELLBUTRIN XL) 300 MG 24 hr tablet Take 1 tablet by  mouth daily.  3  . cholecalciferol (VITAMIN D3) 25 MCG (1000 UT) tablet Take 1,000 Units by mouth daily.    . fexofenadine (ALLEGRA) 180 MG tablet Take 1 tablet by mouth 3 (three) times a week.    Marland Kitchen glucosamine-chondroitin 500-400 MG tablet Take 1 tablet by mouth daily.    Marland Kitchen guanFACINE (INTUNIV) 2 MG TB24 ER tablet TK 1 T PO HS    . ibuprofen (ADVIL,MOTRIN) 200 MG tablet Take 800 mg by mouth every 6 (six) hours as needed for pain.    Marland Kitchen ketoconazole (NIZORAL) 2 % cream Apply topically.    Marland Kitchen levothyroxine (SYNTHROID) 75 MCG tablet TK 1 T PO QD    . Multiple Vitamin (MULTIVITAMIN) tablet Take 1 tablet by mouth daily.    Marland Kitchen omeprazole (PRILOSEC) 20 MG capsule TK 1 C PO QD    . phentermine 30 MG capsule Take 30 mg by mouth every morning.    . Probiotic Product (PROBIOTIC-10 PO) Take by mouth.    . vitamin B-12 (CYANOCOBALAMIN) 100 MCG tablet Take 100 mcg by mouth daily.    Marland Kitchen EPINEPHrine (EPIPEN) 0.3 mg/0.3 mL IJ SOAJ injection Inject 0.3 mg into the muscle as needed.     No current facility-administered medications on file prior to visit.     ALLERGIES: Bee venom  Family History  Problem Relation Age of Onset  .  Hypertension Brother   . Cancer Paternal Grandmother        colon  . Liver cancer Paternal Grandmother   . Breast cancer Neg Hx     SH:  Single, non smoker  Review of Systems  Genitourinary: Positive for vaginal pain.  Skin: Positive for rash.  All other systems reviewed and are negative.   PHYSICAL EXAMINATION:    BP 112/70   Pulse 84   Temp (!) 97.3 F (36.3 C) (Temporal)   Ht 5' 2.25" (1.581 m)   Wt 300 lb (136.1 kg)   LMP 07/01/2017 (Approximate)   BMI 54.43 kg/m     General appearance: alert, cooperative and appears stated age Lymph:  no inguinal LAD noted  Pelvic: External genitalia:  Erythematous and indurated abscess on right labia majora, no fluctuance, induration is present, with compression there is a small amount of thick purulent drainage.               Urethra:  normal appearing urethra with no masses, tenderness or lesions              Bartholins and Skenes: normal                 Vagina: normal appearing vagina with normal color and discharge, no lesions             Ext:  Three or four erythematous lesions noted on back and inner portion of LEs bilaterally, central area resembling a bit is present, one lesions that is older is flatter and more vascular in appearance.  Chaperone was present for exam.  Assessment: Vulvar abscess Leg lesions  Plan: Bactrim DS BID x 7 days.   Wound culture obtained.  Depending on improvement, will plan follow up for recehck Triamcinolone 0.5% cream TID.  I do not know what is the cause and feel she needs to follow up with dermatology.  She has one and will call the beginning of next week.

## 2018-11-02 NOTE — Telephone Encounter (Signed)
Patient has a knot or mass on the outside of her vagina.

## 2018-11-04 ENCOUNTER — Encounter: Payer: Self-pay | Admitting: Obstetrics & Gynecology

## 2018-11-04 LAB — WOUND CULTURE

## 2018-11-09 DIAGNOSIS — F331 Major depressive disorder, recurrent, moderate: Secondary | ICD-10-CM | POA: Diagnosis not present

## 2018-11-16 DIAGNOSIS — F331 Major depressive disorder, recurrent, moderate: Secondary | ICD-10-CM | POA: Diagnosis not present

## 2018-11-23 DIAGNOSIS — F331 Major depressive disorder, recurrent, moderate: Secondary | ICD-10-CM | POA: Diagnosis not present

## 2018-11-29 DIAGNOSIS — F331 Major depressive disorder, recurrent, moderate: Secondary | ICD-10-CM | POA: Diagnosis not present

## 2018-12-06 DIAGNOSIS — F331 Major depressive disorder, recurrent, moderate: Secondary | ICD-10-CM | POA: Diagnosis not present

## 2018-12-13 DIAGNOSIS — F331 Major depressive disorder, recurrent, moderate: Secondary | ICD-10-CM | POA: Diagnosis not present

## 2018-12-17 DIAGNOSIS — L03115 Cellulitis of right lower limb: Secondary | ICD-10-CM | POA: Diagnosis not present

## 2018-12-17 DIAGNOSIS — S80861A Insect bite (nonvenomous), right lower leg, initial encounter: Secondary | ICD-10-CM | POA: Diagnosis not present

## 2018-12-17 DIAGNOSIS — S80862A Insect bite (nonvenomous), left lower leg, initial encounter: Secondary | ICD-10-CM | POA: Diagnosis not present

## 2018-12-21 DIAGNOSIS — F331 Major depressive disorder, recurrent, moderate: Secondary | ICD-10-CM | POA: Diagnosis not present

## 2018-12-28 DIAGNOSIS — F331 Major depressive disorder, recurrent, moderate: Secondary | ICD-10-CM | POA: Diagnosis not present

## 2019-01-03 DIAGNOSIS — F331 Major depressive disorder, recurrent, moderate: Secondary | ICD-10-CM | POA: Diagnosis not present

## 2019-01-10 DIAGNOSIS — Z23 Encounter for immunization: Secondary | ICD-10-CM | POA: Diagnosis not present

## 2019-01-10 DIAGNOSIS — Z0000 Encounter for general adult medical examination without abnormal findings: Secondary | ICD-10-CM | POA: Diagnosis not present

## 2019-01-11 DIAGNOSIS — F331 Major depressive disorder, recurrent, moderate: Secondary | ICD-10-CM | POA: Diagnosis not present

## 2019-01-17 DIAGNOSIS — R41840 Attention and concentration deficit: Secondary | ICD-10-CM | POA: Diagnosis not present

## 2019-01-17 DIAGNOSIS — Z79899 Other long term (current) drug therapy: Secondary | ICD-10-CM | POA: Diagnosis not present

## 2019-01-17 DIAGNOSIS — F338 Other recurrent depressive disorders: Secondary | ICD-10-CM | POA: Diagnosis not present

## 2019-01-18 DIAGNOSIS — F331 Major depressive disorder, recurrent, moderate: Secondary | ICD-10-CM | POA: Diagnosis not present

## 2019-01-24 DIAGNOSIS — F331 Major depressive disorder, recurrent, moderate: Secondary | ICD-10-CM | POA: Diagnosis not present

## 2019-01-30 DIAGNOSIS — Z0000 Encounter for general adult medical examination without abnormal findings: Secondary | ICD-10-CM | POA: Diagnosis not present

## 2019-01-30 DIAGNOSIS — E039 Hypothyroidism, unspecified: Secondary | ICD-10-CM | POA: Diagnosis not present

## 2019-01-30 DIAGNOSIS — Z13220 Encounter for screening for lipoid disorders: Secondary | ICD-10-CM | POA: Diagnosis not present

## 2019-02-01 DIAGNOSIS — F331 Major depressive disorder, recurrent, moderate: Secondary | ICD-10-CM | POA: Diagnosis not present

## 2019-02-01 DIAGNOSIS — Z20828 Contact with and (suspected) exposure to other viral communicable diseases: Secondary | ICD-10-CM | POA: Diagnosis not present

## 2019-02-14 DIAGNOSIS — F331 Major depressive disorder, recurrent, moderate: Secondary | ICD-10-CM | POA: Diagnosis not present

## 2019-02-21 DIAGNOSIS — F331 Major depressive disorder, recurrent, moderate: Secondary | ICD-10-CM | POA: Diagnosis not present

## 2019-02-28 DIAGNOSIS — K219 Gastro-esophageal reflux disease without esophagitis: Secondary | ICD-10-CM | POA: Diagnosis not present

## 2019-03-07 DIAGNOSIS — F331 Major depressive disorder, recurrent, moderate: Secondary | ICD-10-CM | POA: Diagnosis not present

## 2019-03-14 DIAGNOSIS — F331 Major depressive disorder, recurrent, moderate: Secondary | ICD-10-CM | POA: Diagnosis not present

## 2019-03-21 DIAGNOSIS — F331 Major depressive disorder, recurrent, moderate: Secondary | ICD-10-CM | POA: Diagnosis not present

## 2019-03-23 DIAGNOSIS — Z23 Encounter for immunization: Secondary | ICD-10-CM | POA: Diagnosis not present

## 2019-04-04 DIAGNOSIS — F331 Major depressive disorder, recurrent, moderate: Secondary | ICD-10-CM | POA: Diagnosis not present

## 2019-04-11 DIAGNOSIS — F331 Major depressive disorder, recurrent, moderate: Secondary | ICD-10-CM | POA: Diagnosis not present

## 2019-04-12 DIAGNOSIS — Z23 Encounter for immunization: Secondary | ICD-10-CM | POA: Diagnosis not present

## 2019-04-18 DIAGNOSIS — F331 Major depressive disorder, recurrent, moderate: Secondary | ICD-10-CM | POA: Diagnosis not present

## 2019-04-23 ENCOUNTER — Ambulatory Visit: Payer: Self-pay

## 2019-04-23 ENCOUNTER — Other Ambulatory Visit: Payer: Self-pay

## 2019-04-23 ENCOUNTER — Ambulatory Visit: Payer: BLUE CROSS/BLUE SHIELD | Admitting: Orthopedic Surgery

## 2019-04-23 VITALS — Ht 63.5 in | Wt 284.0 lb

## 2019-04-23 DIAGNOSIS — M25521 Pain in right elbow: Secondary | ICD-10-CM

## 2019-04-23 DIAGNOSIS — M170 Bilateral primary osteoarthritis of knee: Secondary | ICD-10-CM

## 2019-04-27 ENCOUNTER — Encounter: Payer: Self-pay | Admitting: Orthopedic Surgery

## 2019-04-27 NOTE — Progress Notes (Signed)
Office Visit Note   Patient: Dominique Mcpherson           Date of Birth: December 09, 1965           MRN: [id number] Visit Date: 04/23/2019 Requested by: Leeroy Cha, MD [address] [place],  [place] [id number] PCP: Leeroy Cha, MD  Subjective: Chief Complaint  Patient presents with  . Right Knee - Pain  . Left Knee - Pain    HPI: Dominique Mcpherson is a 53 y.o. female who presents to the office complaining of bilateral knee pain and right arm pain.  Patient notes a long history of bilateral knee pain.  She has a history of a left knee meniscal tear but notes that her right knee and left knee hurt about the same.  She describes giving way of bilateral knees.  But denies any mechanical symptoms.  She denies any injury to the bilateral knees.  Overall she just wants a x-ray for evaluation of the progression of her arthritis but her main concern is her right arm pain.  She notes 6 weeks of right arm pain.  She denies any injury prior to onset of pain.  She notes that she wakes up at night with pain.  She localizes the pain to the dorsal aspect of the right elbow and forearm.  She denies any numbness or tingling.  She has tried ibuprofen, naproxen, Tylenol without much relief.  She does not have any history of diabetes or smoking..                ROS:  All systems reviewed are negative as they relate to the chief complaint within the history of present illness.  Patient denies fevers or chills.  Assessment & Plan: Visit Diagnoses:  1. Primary osteoarthritis of knees, bilateral   2. Right elbow pain     Plan: Patient is a 53 year old female who presents with complaints of bilateral knee pain and right arm pain.  She has a history of bilateral knee osteoarthritis.  X-rays were obtained today which reveal significantly worse arthritis of the left knee compared with the right knee.  However her pain is equivalent between both knees.  Bilateral knee injections were offered to  her today but she declined.    Her main concern is her right arm pain.  Based on her history and physical exam, impression is posterior interosseous nerve compression.  Ordered nerve conduction study of the right upper extremity to evaluate for PIN compression.  Patient will follow up after study to review results.  On examination today most of the tenderness is in the radial tunnel.  No real tenderness localizing to the lateral epicondyle.  Some pain with resisted supination on the right-hand side.  Looks more like posterior interosseous nerve compression as opposed to other pathologies.  Nerve study pending.  Follow-Up Instructions: No follow-ups on file.   Orders:  Orders Placed This Encounter  Procedures  . XR Knee 1-2 Views Right  . XR Knee 1-2 Views Left  . Ambulatory referral to Physical Medicine Rehab   No orders of the defined types were placed in this encounter.     Procedures: No procedures performed   Clinical Data: No additional findings.  Objective: Vital Signs: Ht 5' 3.5" (1.613 m)   Wt 284 lb (128.8 kg)   LMP 07/13/2017   BMI 49.52 kg/m   Physical Exam:  Constitutional: Patient appears well-developed HEENT:  Head: Normocephalic Eyes:EOM are normal Neck: Normal range  of motion Cardiovascular: Normal rate Pulmonary/chest: Effort normal Neurologic: Patient is alert Skin: Skin is warm Psychiatric: Patient has normal mood and affect  Ortho Exam:  Mild tenderness palpation over the lateral epicondyle.  Moderate tenderness palpation with point of maximal tenderness at the dorsal forearm overlying the posterior interosseous nerve course.  Full sensation intact throughout the radial, median, ulnar nerve distribution of the right hand.  No significant weakness of the right hand.  No tenderness over the condyle.  Distal biceps tendon intact.  Specialty Comments:  No specialty comments available.  Imaging: No results found.   PMFS History: Patient Active  Problem List   Diagnosis Date Noted  . Left elbow pain 06/11/2016  . Achilles tendon pain 06/11/2016  . Melanoma of upper arm ([place]) 05/09/2013  . Morbid obesity ([place]) 11/29/2012   Past Medical History:  Diagnosis Date  . Acid reflux   . Anxiety   . Depression   . Hypothyroidism   . Knee osteoarthritis    with degenerative medial meniscus tear  . Meniscus tear   . Osteoarthritis     Family History  Problem Relation Age of Onset  . Hypertension Brother   . Cancer Paternal Grandmother        colon  . Liver cancer Paternal Grandmother   . Breast cancer Neg Hx     Past Surgical History:  Procedure Laterality Date  . CHOLECYSTECTOMY    . FOOT SURGERY     bilateral plantar fascitis  . HYSTEROSCOPY WITH D & C N/A 01/31/2013   Procedure: DILATATION AND CURETTAGE  TRU CLEAR /HYSTEROSCOPY;  Surgeon: Azalia Bilis, MD;  Location: [facility];  Service: Gynecology;  Laterality: N/A;  . MELANOMA EXCISION Right 05/17/2013   Procedure: WIDE EXCISION MELANOMA RIGHT UPPER ARM;  Surgeon: Harl Bowie, MD;  Location: [facility];  Service: General;  Laterality: Right;  . TONSILLECTOMY    . UPPER GI ENDOSCOPY  3/15   Social History   Occupational History  . Not on file  Tobacco Use  . Smoking status: Never Smoker  . Smokeless tobacco: Never Used  Substance and Sexual Activity  . Alcohol use: Yes    Alcohol/week: 2.0 standard drinks    Types: 2 Standard drinks or equivalent per week    Comment: occ  . Drug use: No  . Sexual activity: Never    Partners: Male    Birth control/protection: Abstinence

## 2019-04-30 ENCOUNTER — Other Ambulatory Visit: Payer: Self-pay

## 2019-05-01 ENCOUNTER — Other Ambulatory Visit: Payer: Self-pay

## 2019-05-01 ENCOUNTER — Ambulatory Visit: Payer: BC Managed Care – PPO | Admitting: Certified Nurse Midwife

## 2019-05-01 ENCOUNTER — Other Ambulatory Visit (HOSPITAL_COMMUNITY)
Admission: RE | Admit: 2019-05-01 | Discharge: 2019-05-01 | Disposition: A | Discharge status: Home / Self Care | Payer: BC Managed Care – PPO | Source: Ambulatory Visit | Attending: Obstetrics & Gynecology | Admitting: Obstetrics & Gynecology

## 2019-05-01 ENCOUNTER — Encounter: Payer: Self-pay | Admitting: Certified Nurse Midwife

## 2019-05-01 VITALS — BP 118/68 | HR 68 | Temp 97.4°F | Resp 16 | Ht 61.75 in | Wt 294.0 lb

## 2019-05-01 DIAGNOSIS — N898 Other specified noninflammatory disorders of vagina: Secondary | ICD-10-CM | POA: Diagnosis not present

## 2019-05-01 DIAGNOSIS — Z124 Encounter for screening for malignant neoplasm of cervix: Secondary | ICD-10-CM

## 2019-05-01 DIAGNOSIS — N951 Menopausal and female climacteric states: Secondary | ICD-10-CM

## 2019-05-01 DIAGNOSIS — Z01419 Encounter for gynecological examination (general) (routine) without abnormal findings: Secondary | ICD-10-CM | POA: Diagnosis not present

## 2019-05-01 NOTE — Patient Instructions (Signed)

## 2019-05-01 NOTE — Addendum Note (Signed)
Addended by: Susy Manor on: 05/01/2019 01:44 PM   Modules accepted: Orders

## 2019-05-01 NOTE — Progress Notes (Signed)
53 y.o. G0P0000 Single  Caucasian Fe here for annual exam. Menopausal no vaginal dryness or vaginal bleeding. Mammogram due. Busy with work.. Has noted some increase discharge from bottom of sacral area in between buttocks. Has been using antifungal cream with good success, but continues. No new personal products.Sees Dr Fara Olden for aex, labs, Hypothyroid, anxiety.All medications stable. Has been working on weight loss. Happy with how she is feeling. No other health issues today.  Patient's last menstrual period was 07/13/2017.          Sexually active: No. never The current method of family planning is abstinence.    Exercising: No.  exercise Smoker:  no  Review of Systems  Constitutional: Negative.   HENT: Negative.   Eyes: Negative.   Respiratory: Negative.   Cardiovascular: Negative.   Gastrointestinal: Negative.   Genitourinary: Negative.   Musculoskeletal: Negative.   Skin: Negative.   Neurological: Negative.   Endo/Heme/Allergies: Negative.   Psychiatric/Behavioral: Negative.     Health Maintenance: Pap:  03-12-16 neg History of Abnormal Pap: no MMG:  11-24-17 category a density birads 1:neg Self Breast exams: no Colonoscopy:  2015 neg f/u 39yrs BMD:   none TDaP:  2010 Shingles: not done Pneumonia: 8/17 Hep C and HIV: not done Labs: if needed   reports that she has never smoked. She has never used smokeless tobacco. She reports current alcohol use of about 2.0 standard drinks of alcohol per week. She reports that she does not use drugs.  Past Medical History:  Diagnosis Date  . Acid reflux   . Anxiety   . Depression   . Hypothyroidism   . Knee osteoarthritis    with degenerative medial meniscus tear  . Meniscus tear   . Osteoarthritis     Past Surgical History:  Procedure Laterality Date  . CHOLECYSTECTOMY    . FOOT SURGERY     bilateral plantar fascitis  . HYSTEROSCOPY WITH D & C N/A 01/31/2013   Procedure: DILATATION AND CURETTAGE  TRU CLEAR  /HYSTEROSCOPY;  Surgeon: Azalia Bilis, MD;  Location: [facility];  Service: Gynecology;  Laterality: N/A;  . MELANOMA EXCISION Right 05/17/2013   Procedure: WIDE EXCISION MELANOMA RIGHT UPPER ARM;  Surgeon: Harl Bowie, MD;  Location: [facility];  Service: General;  Laterality: Right;  . TONSILLECTOMY    . UPPER GI ENDOSCOPY  3/15    Current Outpatient Medications  Medication Sig Dispense Refill  . acetaminophen (TYLENOL) 500 MG tablet Take 1,000 mg by mouth as needed.     Marland Kitchen buPROPion (WELLBUTRIN XL) 300 MG 24 hr tablet Take 1 tablet by mouth daily.  3  . cholecalciferol (VITAMIN D3) 25 MCG (1000 UT) tablet Take 1,000 Units by mouth daily.    Marland Kitchen EPINEPHrine (EPIPEN) 0.3 mg/0.3 mL IJ SOAJ injection Inject 0.3 mg into the muscle as needed.    . fexofenadine (ALLEGRA) 180 MG tablet Take 1 tablet by mouth 3 (three) times a week.    Marland Kitchen glucosamine-chondroitin 500-400 MG tablet Take 1 tablet by mouth daily.    Marland Kitchen guanFACINE (INTUNIV) 2 MG TB24 ER tablet TK 1 T PO HS    . ibuprofen (ADVIL,MOTRIN) 200 MG tablet Take 800 mg by mouth every 6 (six) hours as needed for pain.    Marland Kitchen ketoconazole (NIZORAL) 2 % cream Apply topically.    Marland Kitchen levothyroxine (SYNTHROID) 75 MCG tablet TK 1 T PO QD    . Multiple Vitamin (MULTIVITAMIN) tablet Take 1 tablet by mouth daily.    Marland Kitchen  omeprazole (PRILOSEC) 20 MG capsule TK 1 C PO QD    . phentermine 30 MG capsule Take 30 mg by mouth every morning.    . Probiotic Product (PROBIOTIC-10 PO) Take by mouth.    . sulfamethoxazole-trimethoprim (BACTRIM DS) 800-160 MG tablet Take 1 tablet by mouth 2 (two) times daily. 14 tablet 0  . triamcinolone cream (KENALOG) 0.5 % Apply 1 application topically 3 (three) times daily. 30 g 0  . vitamin B-12 (CYANOCOBALAMIN) 100 MCG tablet Take 100 mcg by mouth daily.     No current facility-administered medications for this visit.    Family History  Problem Relation Age of Onset  . Hypertension Brother   . Cancer Paternal  Grandmother        colon  . Liver cancer Paternal Grandmother   . Breast cancer Neg Hx     ROS:  Pertinent items are noted in HPI.  Otherwise, a comprehensive ROS was negative.  Exam:   LMP 07/13/2017    Ht Readings from Last 3 Encounters:  04/23/19 5' 3.5" (1.613 m)  11/02/18 5' 2.25" (1.581 m)  10/13/17 5' 2.25" (1.581 m)    General appearance: alert, cooperative and appears stated age Head: Normocephalic, without obvious abnormality, atraumatic Neck: no adenopathy, supple, symmetrical, trachea midline and thyroid normal to inspection and palpation Lungs: clear to auscultation bilaterally Breasts: normal appearance, no masses or tenderness, No nipple retraction or dimpling, No nipple discharge or bleeding, No axillary or supraclavicular adenopathy Heart: regular rate and rhythm Abdomen: soft, non-tender; no masses,  no organomegaly Extremities: extremities normal, atraumatic, no cyanosis or edema Skin: Skin color, texture, turgor normal. No rashes or lesions Lymph nodes: Cervical, supraclavicular, and axillary nodes normal. No abnormal inguinal nodes palpated Neurologic: Grossly normal   Pelvic: External genitalia:  no lesions              Urethra:  normal appearing urethra with no masses, tenderness or lesions              Bartholin's and Skene's: normal                 Vagina: normal appearing vagina with normal color and discharge, no lesions              Cervix: no cervical motion tenderness, no lesions and nulliparous appearance              Pap taken: No. Bimanual Exam:  Uterus:  normal size, contour, position, consistency, mobility, non-tender              Adnexa: normal adnexa, no mass, fullness, tenderness and limited by body habitus               Rectovaginal: Confirms               Anus:  normal sphincter tone, no lesions, above anus slight increase pink, no lesions or break in skin  Chaperone present: yes  A:  Well Woman with normal exam  Menopausal no  HRT  Obesity with weight loss in progress  Vaginal discharge R/O infection  Anxiety, Hypothyroid with PCP management  P:   Reviewed health and wellness pertinent to exam  Aware of need to advise if vaginal bleeding.  Continue with weight loss journey.  Lab: Affirm will treat if needed  Continue with MD follow up  Pap smear: no   counseled on breast self exam, mammography screening, feminine hygiene, menopause, adequate intake of calcium and vitamin D, diet and  exercise  return annually or prn  An After Visit Summary was printed and given to the patient.

## 2019-05-02 LAB — VAGINITIS/VAGINOSIS, DNA PROBE
Candida Species: NEGATIVE
Gardnerella vaginalis: NEGATIVE
Trichomonas vaginosis: NEGATIVE

## 2019-05-03 LAB — CYTOLOGY - PAP
Comment: NEGATIVE
Diagnosis: NEGATIVE
High risk HPV: NEGATIVE

## 2019-05-09 DIAGNOSIS — F331 Major depressive disorder, recurrent, moderate: Secondary | ICD-10-CM | POA: Diagnosis not present

## 2019-05-16 DIAGNOSIS — F331 Major depressive disorder, recurrent, moderate: Secondary | ICD-10-CM | POA: Diagnosis not present

## 2019-05-29 DIAGNOSIS — F338 Other recurrent depressive disorders: Secondary | ICD-10-CM | POA: Diagnosis not present

## 2019-05-29 DIAGNOSIS — R41840 Attention and concentration deficit: Secondary | ICD-10-CM | POA: Diagnosis not present

## 2019-05-29 DIAGNOSIS — H93299 Other abnormal auditory perceptions, unspecified ear: Secondary | ICD-10-CM | POA: Diagnosis not present

## 2019-05-29 DIAGNOSIS — Z79899 Other long term (current) drug therapy: Secondary | ICD-10-CM | POA: Diagnosis not present

## 2019-05-30 ENCOUNTER — Ambulatory Visit (INDEPENDENT_AMBULATORY_CARE_PROVIDER_SITE_OTHER): Payer: BC Managed Care – PPO | Admitting: Physical Medicine and Rehabilitation

## 2019-05-30 ENCOUNTER — Other Ambulatory Visit: Payer: Self-pay

## 2019-05-30 ENCOUNTER — Encounter: Payer: Self-pay | Admitting: Physical Medicine and Rehabilitation

## 2019-05-30 DIAGNOSIS — R202 Paresthesia of skin: Secondary | ICD-10-CM

## 2019-05-30 NOTE — Progress Notes (Signed)
 .  Numeric Pain Rating Scale and Functional Assessment Average Pain 7   In the last MONTH (on 0-10 scale) has pain interfered with the following?  1. General activity like being  able to carry out your everyday physical activities such as walking, climbing stairs, carrying groceries, or moving a chair?  Rating(8)     

## 2019-05-31 NOTE — Progress Notes (Signed)
Dominique Mcpherson - 54 y.o. female MRN [id number]  Date of birth: Sep 01, 1965  Office Visit Note: Visit Date: 05/30/2019 PCP: Leeroy Cha, MD Referred by: Leeroy Cha,*  Subjective: Chief Complaint  Patient presents with  . Right Forearm - Pain, Numbness, Tingling  . Right Hand - Pain, Numbness, Tingling   HPI: Dominique Mcpherson is a 54 y.o. female who comes in today At the request of Dr. Anderson Malta for electrodiagnostic study of the right upper limb.  He specifically request evaluation for possible posterior interosseous nerve entrapment.  She is right-hand dominant and in December 2020 she endorsed to Dr. Marlou Sa that she had had about 6 weeks of progressive pain in the right arm particularly dorsal forearm into the hand without numbness and tingling.  From a historical standpoint she did tell my staff around her that she did have some nondermatomal numbness and tingling in the right hand but endorsed to me during the procedure that she really did not have much in the way of tingling or numbness that most of the symptoms were pain in the dorsal forearm over the extensor wad into the top of the hand.  Nonetheless she reports worsening symptoms of sitting at a computer and typing.  She also endorses that the fingertips and sometimes the whole hand will turn blue.  Interestingly my observation today we first set down was that the tips of the fourth and fifth digit on the right hand were actually very blue compared to the rest of the hand.  She has not had any diagnosis of Raynaud's syndrome or rheumatologic disorders.  She reports her pain is a 7 out of 10 and it does affect her daily living and work.  She has not had prior electrodiagnostic study.  ROS Otherwise per HPI.  Assessment & Plan: Visit Diagnoses:  1. Paresthesia of skin     Plan: Impression: The above electrodiagnostic study is ABNORMAL and reveals evidence of a moderate right median nerve entrapment at the wrist  (perhaps asymptomatic) affecting sensory and motor components.   There is no significant electrodiagnostic evidence of any other focal nerve entrapment (specifically any nonviable lesion to the radial nerve and posterior interosseous nerve), brachial plexopathy or cervical radiculopathy.   Recommendations: 1.  Follow-up with referring physician. 2.  Continue current management of symptoms.  Meds & Orders: No orders of the defined types were placed in this encounter.   Orders Placed This Encounter  Procedures  . NCV with EMG (electromyography)    Follow-up: Return for Anderson Malta, MD.   Procedures: No procedures performed  EMG & NCV Findings: Evaluation of the right median motor nerve showed prolonged distal onset latency (4.6 ms), reduced amplitude (2.7 mV), and decreased conduction velocity (Elbow-Wrist, 43 m/s).  The right median (across palm) sensory nerve showed prolonged distal peak latency (Wrist, 4.1 ms) and prolonged distal peak latency (Palm, 2.2 ms).  All remaining nerves (as indicated in the following tables) were within normal limits.    All examined muscles (as indicated in the following table) showed no evidence of electrical instability.    Impression: The above electrodiagnostic study is ABNORMAL and reveals evidence of a moderate right median nerve entrapment at the wrist (perhaps asymptomatic) affecting sensory and motor components.   There is no significant electrodiagnostic evidence of any other focal nerve entrapment (specifically any nonviable lesion to the radial nerve and posterior interosseous nerve), brachial plexopathy or cervical radiculopathy.   Recommendations: 1.  Follow-up with referring  physician. 2.  Continue current management of symptoms.  ___________________________ Laurence Spates FAAPMR Board Certified, American Board of Physical Medicine and Rehabilitation    Nerve Conduction Studies Anti Sensory Summary Table   Stim Site NR Peak (ms)  Norm Peak (ms) P-T Amp (V) Norm P-T Amp Site1 Site2 Delta-P (ms) Dist (cm) Vel (m/s) Norm Vel (m/s)  Right Median Acr Palm Anti Sensory (2nd Digit)  31.3C  Wrist    *4.1 <3.6 16.2 >10 Wrist Palm 1.9 0.0    Palm    *2.2 <2.0 8.2         Right Radial Anti Sensory (Base 1st Digit)  31.2C  Wrist    2.2 <3.1 30.3  Wrist Base 1st Digit 2.2 0.0    Right Ulnar Anti Sensory (5th Digit)  31.3C  Wrist    3.6 <3.7 23.7 >15.0 Wrist 5th Digit 3.6 14.0 39 >38   Motor Summary Table   Stim Site NR Onset (ms) Norm Onset (ms) O-P Amp (mV) Norm O-P Amp Site1 Site2 Delta-0 (ms) Dist (cm) Vel (m/s) Norm Vel (m/s)  Right Median Motor (Abd Poll Brev)  31.3C  Wrist    *4.6 <4.2 *2.7 >5 Elbow Wrist 4.4 19.0 *43 >50  Elbow    9.0  1.0         Right Radial Motor (Ext Indicis)  32.1C  8cm    2.4 <2.5 2.5 >1.7 Up Arm 8cm 3.9 24.5 63 >60  Up Arm    6.3  1.2         Right Ulnar Motor (Abd Dig Min)  31.3C  Wrist    3.4 <4.2 10.0 >3 B Elbow Wrist 2.9 16.5 57 >53  B Elbow    6.3  9.0  A Elbow B Elbow 1.4 10.0 71 >53  A Elbow    7.7  7.7          EMG   Side Muscle Nerve Root Ins Act Fibs Psw Amp Dur Poly Recrt Int Fraser Din Comment  Right ExtIndicis Radial (Post Int) C7-8 Nml Nml Nml Nml Nml 0 Nml Nml   Right 1stDorInt Ulnar C8-T1 Nml Nml Nml Nml Nml 0 Nml Nml   Right Abd Poll Brev Median C8-T1 Nml Nml Nml Nml Nml 0 Nml Nml   Right ExtDigCom   Nml Nml Nml Nml Nml 0 Nml Nml   Right Triceps Radial C6-7-8 Nml Nml Nml Nml Nml 0 Nml Nml   Right Deltoid Axillary C5-6 Nml Nml Nml Nml Nml 0 Nml Nml     Nerve Conduction Studies Anti Sensory Left/Right Comparison   Stim Site L Lat (ms) R Lat (ms) L-R Lat (ms) L Amp (V) R Amp (V) L-R Amp (%) Site1 Site2 L Vel (m/s) R Vel (m/s) L-R Vel (m/s)  Median Acr Palm Anti Sensory (2nd Digit)  31.3C  Wrist  *4.1   16.2  Wrist Palm     Palm  *2.2   8.2        Radial Anti Sensory (Base 1st Digit)  31.2C  Wrist  2.2   30.3  Wrist Base 1st Digit     Ulnar Anti Sensory (5th Digit)   31.3C  Wrist  3.6   23.7  Wrist 5th Digit  39    Motor Left/Right Comparison   Stim Site L Lat (ms) R Lat (ms) L-R Lat (ms) L Amp (mV) R Amp (mV) L-R Amp (%) Site1 Site2 L Vel (m/s) R Vel (m/s) L-R Vel (m/s)  Median Motor (Abd [employer]  Brev)  31.3C  Wrist  *4.6   *2.7  Elbow Wrist  *43   Elbow  9.0   1.0        Radial Motor (Ext Indicis)  32.1C  8cm  2.4   2.5  Up Arm 8cm  63   Up Arm  6.3   1.2        Ulnar Motor (Abd Dig Min)  31.3C  Wrist  3.4   10.0  B Elbow Wrist  57   B Elbow  6.3   9.0  A Elbow B Elbow  71   A Elbow  7.7   7.7           Waveforms:              Clinical History: No specialty comments available.   She reports that she has never smoked. She has never used smokeless tobacco. No results for input(s): HGBA1C, LABURIC in the last 8760 hours.  Objective:  VS:  HT:    WT:   BMI:     BP:   HR: bpm  TEMP: ( )  RESP:  Physical Exam Musculoskeletal:        General: No swelling, tenderness or deformity.     Comments: Inspection reveals no atrophy of the bilateral APB or FDI or hand intrinsics. There is no swelling, color changes, allodynia or dystrophic changes. There is 5 out of 5 strength in the bilateral wrist extension, finger abduction and long finger flexion. There is intact sensation to light touch in all dermatomal and peripheral nerve distributions.  There is pain over the extensor wad.  There is a negative Hoffmann's test bilaterally.  Skin:    General: Skin is warm and dry.     Findings: No erythema or rash.  Neurological:     General: No focal deficit present.     Mental Status: She is alert and oriented to person, place, and time.     Motor: No weakness or abnormal muscle tone.     Coordination: Coordination normal.  Psychiatric:        Mood and Affect: Mood normal.        Behavior: Behavior normal.     Ortho Exam Imaging: No results found.  Past Medical/Family/Surgical/Social History: Medications & Allergies reviewed per EMR, new  medications updated. Patient Active Problem List   Diagnosis Date Noted  . Left elbow pain 06/11/2016  . Achilles tendon pain 06/11/2016  . Melanoma of upper arm ([place]) 05/09/2013  . Morbid obesity ([place]) 11/29/2012   Past Medical History:  Diagnosis Date  . Acid reflux   . Anxiety   . Depression   . Hypothyroidism   . Knee osteoarthritis    with degenerative medial meniscus tear  . Meniscus tear   . Osteoarthritis    Family History  Problem Relation Age of Onset  . Hypertension Brother   . Cancer Paternal Grandmother        colon  . Liver cancer Paternal Grandmother   . Breast cancer Neg Hx    Past Surgical History:  Procedure Laterality Date  . CHOLECYSTECTOMY    . FOOT SURGERY     bilateral plantar fascitis  . HYSTEROSCOPY WITH D & C N/A 01/31/2013   Procedure: DILATATION AND CURETTAGE  TRU CLEAR /HYSTEROSCOPY;  Surgeon: Azalia Bilis, MD;  Location: [facility];  Service: Gynecology;  Laterality: N/A;  . MELANOMA EXCISION Right 05/17/2013   Procedure: WIDE EXCISION MELANOMA RIGHT UPPER ARM;  Surgeon:  Harl Bowie, MD;  Location: [facility];  Service: General;  Laterality: Right;  . TONSILLECTOMY    . UPPER GI ENDOSCOPY  3/15   Social History   Occupational History  . Not on file  Tobacco Use  . Smoking status: Never Smoker  . Smokeless tobacco: Never Used  Substance and Sexual Activity  . Alcohol use: Yes    Alcohol/week: 2.0 standard drinks    Types: 2 Standard drinks or equivalent per week    Comment: occ  . Drug use: No  . Sexual activity: Never    Partners: Male    Birth control/protection: Abstinence

## 2019-05-31 NOTE — Procedures (Signed)
EMG & NCV Findings: Evaluation of the right median motor nerve showed prolonged distal onset latency (4.6 ms), reduced amplitude (2.7 mV), and decreased conduction velocity (Elbow-Wrist, 43 m/s).  The right median (across palm) sensory nerve showed prolonged distal peak latency (Wrist, 4.1 ms) and prolonged distal peak latency (Palm, 2.2 ms).  All remaining nerves (as indicated in the following tables) were within normal limits.    All examined muscles (as indicated in the following table) showed no evidence of electrical instability.    Impression: The above electrodiagnostic study is ABNORMAL and reveals evidence of a moderate right median nerve entrapment at the wrist (perhaps asymptomatic) affecting sensory and motor components.   There is no significant electrodiagnostic evidence of any other focal nerve entrapment (specifically any nonviable lesion to the radial nerve and posterior interosseous nerve), brachial plexopathy or cervical radiculopathy.   Recommendations: 1.  Follow-up with referring physician. 2.  Continue current management of symptoms.  ___________________________ Laurence Spates FAAPMR Board Certified, American Board of Physical Medicine and Rehabilitation    Nerve Conduction Studies Anti Sensory Summary Table   Stim Site NR Peak (ms) Norm Peak (ms) P-T Amp (V) Norm P-T Amp Site1 Site2 Delta-P (ms) Dist (cm) Vel (m/s) Norm Vel (m/s)  Right Median Acr Palm Anti Sensory (2nd Digit)  31.3C  Wrist    *4.1 <3.6 16.2 >10 Wrist Palm 1.9 0.0    Palm    *2.2 <2.0 8.2         Right Radial Anti Sensory (Base 1st Digit)  31.2C  Wrist    2.2 <3.1 30.3  Wrist Base 1st Digit 2.2 0.0    Right Ulnar Anti Sensory (5th Digit)  31.3C  Wrist    3.6 <3.7 23.7 >15.0 Wrist 5th Digit 3.6 14.0 39 >38   Motor Summary Table   Stim Site NR Onset (ms) Norm Onset (ms) O-P Amp (mV) Norm O-P Amp Site1 Site2 Delta-0 (ms) Dist (cm) Vel (m/s) Norm Vel (m/s)  Right Median Motor (Abd Poll Brev)   31.3C  Wrist    *4.6 <4.2 *2.7 >5 Elbow Wrist 4.4 19.0 *43 >50  Elbow    9.0  1.0         Right Radial Motor (Ext Indicis)  32.1C  8cm    2.4 <2.5 2.5 >1.7 Up Arm 8cm 3.9 24.5 63 >60  Up Arm    6.3  1.2         Right Ulnar Motor (Abd Dig Min)  31.3C  Wrist    3.4 <4.2 10.0 >3 B Elbow Wrist 2.9 16.5 57 >53  B Elbow    6.3  9.0  A Elbow B Elbow 1.4 10.0 71 >53  A Elbow    7.7  7.7          EMG   Side Muscle Nerve Root Ins Act Fibs Psw Amp Dur Poly Recrt Int Fraser Din Comment  Right ExtIndicis Radial (Post Int) C7-8 Nml Nml Nml Nml Nml 0 Nml Nml   Right 1stDorInt Ulnar C8-T1 Nml Nml Nml Nml Nml 0 Nml Nml   Right Abd Poll Brev Median C8-T1 Nml Nml Nml Nml Nml 0 Nml Nml   Right ExtDigCom   Nml Nml Nml Nml Nml 0 Nml Nml   Right Triceps Radial C6-7-8 Nml Nml Nml Nml Nml 0 Nml Nml   Right Deltoid Axillary C5-6 Nml Nml Nml Nml Nml 0 Nml Nml     Nerve Conduction Studies Anti Sensory Left/Right Comparison   Stim Site L  Lat (ms) R Lat (ms) L-R Lat (ms) L Amp (V) R Amp (V) L-R Amp (%) Site1 Site2 L Vel (m/s) R Vel (m/s) L-R Vel (m/s)  Median Acr Palm Anti Sensory (2nd Digit)  31.3C  Wrist  *4.1   16.2  Wrist Palm     Palm  *2.2   8.2        Radial Anti Sensory (Base 1st Digit)  31.2C  Wrist  2.2   30.3  Wrist Base 1st Digit     Ulnar Anti Sensory (5th Digit)  31.3C  Wrist  3.6   23.7  Wrist 5th Digit  39    Motor Left/Right Comparison   Stim Site L Lat (ms) R Lat (ms) L-R Lat (ms) L Amp (mV) R Amp (mV) L-R Amp (%) Site1 Site2 L Vel (m/s) R Vel (m/s) L-R Vel (m/s)  Median Motor (Abd Poll Brev)  31.3C  Wrist  *4.6   *2.7  Elbow Wrist  *43   Elbow  9.0   1.0        Radial Motor (Ext Indicis)  32.1C  8cm  2.4   2.5  Up Arm 8cm  63   Up Arm  6.3   1.2        Ulnar Motor (Abd Dig Min)  31.3C  Wrist  3.4   10.0  B Elbow Wrist  57   B Elbow  6.3   9.0  A Elbow B Elbow  71   A Elbow  7.7   7.7           Waveforms:

## 2019-06-05 ENCOUNTER — Ambulatory Visit: Payer: BC Managed Care – PPO | Admitting: Orthopedic Surgery

## 2019-06-05 ENCOUNTER — Other Ambulatory Visit: Payer: Self-pay

## 2019-06-05 DIAGNOSIS — M79641 Pain in right hand: Secondary | ICD-10-CM | POA: Diagnosis not present

## 2019-06-05 DIAGNOSIS — M79642 Pain in left hand: Secondary | ICD-10-CM

## 2019-06-06 DIAGNOSIS — D1801 Hemangioma of skin and subcutaneous tissue: Secondary | ICD-10-CM | POA: Diagnosis not present

## 2019-06-06 DIAGNOSIS — F331 Major depressive disorder, recurrent, moderate: Secondary | ICD-10-CM | POA: Diagnosis not present

## 2019-06-06 DIAGNOSIS — D225 Melanocytic nevi of trunk: Secondary | ICD-10-CM | POA: Diagnosis not present

## 2019-06-06 DIAGNOSIS — L821 Other seborrheic keratosis: Secondary | ICD-10-CM | POA: Diagnosis not present

## 2019-06-06 DIAGNOSIS — L905 Scar conditions and fibrosis of skin: Secondary | ICD-10-CM | POA: Diagnosis not present

## 2019-06-08 DIAGNOSIS — Z20828 Contact with and (suspected) exposure to other viral communicable diseases: Secondary | ICD-10-CM | POA: Diagnosis not present

## 2019-06-08 DIAGNOSIS — Z03818 Encounter for observation for suspected exposure to other biological agents ruled out: Secondary | ICD-10-CM | POA: Diagnosis not present

## 2019-06-10 ENCOUNTER — Encounter: Payer: Self-pay | Admitting: Orthopedic Surgery

## 2019-06-10 NOTE — Progress Notes (Signed)
Office Visit Note   Patient: Dominique Mcpherson           Date of Birth: Jan 29, 1966           MRN: [id number] Visit Date: 06/05/2019 Requested by: Dominique Cha, MD [address] [place],  [place] [address] PCP: Dominique Cha, MD  Subjective: Chief Complaint  Patient presents with  . Follow-up    HPI: Dominique Mcpherson is a patient with multiple issues.  Today she comes in stating that her fingers are turning blue at times.  This is happening more on the right hand and the left hand.  She also is here to follow-up nerve study for possible radial tunnel syndrome.  Patient does have moderate right median nerve entrapment at the wrist.  She also states that since she was last here she has had some occasions where her fingers will turn blue at times.  Predominantly at the fingertips.  She has not had this happen before.  Denies any other orthopedic or rheumatologic problems that have not already been addressed.              ROS: All systems reviewed are negative as they relate to the chief complaint within the history of present illness.  Patient denies  fevers or chills.   Assessment & Plan: Visit Diagnoses:  1. Bilateral hand pain     Plan: Impression is Raynaud's phenomenon bilateral hands right worse than left.  Patient also has moderate carpal tunnel syndrome.  Would like for her to get rheumatologic evaluation.  Blood pressure in both arms symmetric.  Follow-up with me after her rheumatologic evaluation if she wants to get that carpal tunnel addressed.  That is less of a problem then the new vascular issues she is having with her hands.  Follow-Up Instructions: No follow-ups on file.   Orders:  Orders Placed This Encounter  Procedures  . Ambulatory referral to Rheumatology   No orders of the defined types were placed in this encounter.     Procedures: No procedures performed   Clinical Data: No additional findings.  Objective: Vital Signs: LMP 07/13/2017     Physical Exam:   Constitutional: Patient appears well-developed HEENT:  Head: Normocephalic Eyes:EOM are normal Neck: Normal range of motion Cardiovascular: Normal rate Pulmonary/chest: Effort normal Neurologic: Patient is alert Skin: Skin is warm Psychiatric: Patient has normal mood and affect    Ortho Exam: Ortho exam demonstrates palpable radial pulses bilaterally.  Both hands fill with Allen's testing from radial and ulnar artery.  Blood pressure symmetric in both arms right 142/77 left 138/85 patient has 5 out of 5 grip EPL FPL interosseous wrist flexion extension bicep triceps and deltoid strength.  No masses lymphadenopathy or skin changes noted in that hand or upper extremity region bilaterally  Specialty Comments:  No specialty comments available.  Imaging: No results found.   PMFS History: Patient Active Problem List   Diagnosis Date Noted  . Left elbow pain 06/11/2016  . Achilles tendon pain 06/11/2016  . Melanoma of upper arm ([place] [facility]) 05/09/2013  . Morbid obesity ([facility]) 11/29/2012   Past Medical History:  Diagnosis Date  . Acid reflux   . Anxiety   . Depression   . Hypothyroidism   . Knee osteoarthritis    with degenerative medial meniscus tear  . Meniscus tear   . Osteoarthritis     Family History  Problem Relation Age of Onset  . Hypertension Brother   . Cancer Paternal Grandmother  colon  . Liver cancer Paternal Grandmother   . Breast cancer Neg Hx     Past Surgical History:  Procedure Laterality Date  . CHOLECYSTECTOMY    . FOOT SURGERY     bilateral plantar fascitis  . HYSTEROSCOPY WITH D & C N/A 01/31/2013   Procedure: DILATATION AND CURETTAGE  TRU CLEAR /HYSTEROSCOPY;  Surgeon: Azalia Bilis, MD;  Location: [facility];  Service: Gynecology;  Laterality: N/A;  . MELANOMA EXCISION Right 05/17/2013   Procedure: WIDE EXCISION MELANOMA RIGHT UPPER ARM;  Surgeon: Harl Bowie, MD;  Location: [facility];  Service: General;   Laterality: Right;  . TONSILLECTOMY    . UPPER GI ENDOSCOPY  3/15   Social History   Occupational History  . Not on file  Tobacco Use  . Smoking status: Never Smoker  . Smokeless tobacco: Never Used  Substance and Sexual Activity  . Alcohol use: Yes    Alcohol/week: 2.0 standard drinks    Types: 2 Standard drinks or equivalent per week    Comment: occ  . Drug use: No  . Sexual activity: Never    Partners: Male    Birth control/protection: Abstinence

## 2019-06-13 DIAGNOSIS — F331 Major depressive disorder, recurrent, moderate: Secondary | ICD-10-CM | POA: Diagnosis not present

## 2019-06-25 DIAGNOSIS — F331 Major depressive disorder, recurrent, moderate: Secondary | ICD-10-CM | POA: Diagnosis not present

## 2019-07-11 DIAGNOSIS — F331 Major depressive disorder, recurrent, moderate: Secondary | ICD-10-CM | POA: Diagnosis not present

## 2019-07-12 ENCOUNTER — Other Ambulatory Visit: Payer: Self-pay | Admitting: Internal Medicine

## 2019-07-12 DIAGNOSIS — Z1231 Encounter for screening mammogram for malignant neoplasm of breast: Secondary | ICD-10-CM

## 2019-07-18 DIAGNOSIS — F331 Major depressive disorder, recurrent, moderate: Secondary | ICD-10-CM | POA: Diagnosis not present

## 2019-07-23 ENCOUNTER — Encounter: Payer: Self-pay | Admitting: Certified Nurse Midwife

## 2019-08-07 DIAGNOSIS — F331 Major depressive disorder, recurrent, moderate: Secondary | ICD-10-CM | POA: Diagnosis not present

## 2019-08-10 NOTE — Progress Notes (Signed)
Office Visit Note  Patient: Dominique Mcpherson             Date of Birth: 1965/05/27           MRN: [id number]             PCP: Leeroy Cha, MD Referring: Meredith Pel, MD Visit Date: 08/15/2019 Occupation: @GUAROCC @  Subjective:  New Patient (Initial Visit) (Joint pain)   History of Present Illness: Dominique Mcpherson is a 54 y.o. female seen in consultation per request of Dr. Marlou Sa.  According to the patient in 1990s she had bilateral bunionectomy and plantar fascial release.  In 2015 she started having bilateral knee joint pain at the time she was diagnosed with meniscal tear.  It was mild issue and did not require surgery.  She states she still has to be careful while walking and she starts having some discomfort in her knee joints after being active.  She has had cortisone injection to her knee joints by Dr. Marlou Sa in the past.  Last year she started experiencing pain in her right elbow which she describes over right lateral epicondyle area.  She states she was experiencing numbness in her right hand and change in color.  She went to see Dr. Marlou Sa and he did nerve conduction velocities and EMG.  It showed moderate median nerve entrapment but her symptoms were not related to carpal tunnel syndrome.  She states her symptoms have improved to some extent.  She also has been experiencing pain in her bilateral hands and decreased grip strength.  She is also noticed discoloration in her hands mostly in her right hand.  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  She denies any joint swelling.  She notices some fluid retention in her lower extremities after prolonged standing.  Patient brought pictures on her iPhone which shows bluish discoloration of her bilateral hands.  Activities of Daily Living:  Patient reports morning stiffness for 30 minutes.   Patient Reports nocturnal pain.  Difficulty dressing/grooming: Denies Difficulty climbing stairs:  Reports Difficulty getting out of chair: Reports Difficulty using hands for taps, buttons, cutlery, and/or writing: Reports  Review of Systems  Constitutional: Positive for fatigue. Negative for night sweats, weight gain and weight loss.  HENT: Positive for mouth dryness. Negative for mouth sores, trouble swallowing, trouble swallowing and nose dryness.   Eyes: Negative for pain, redness, visual disturbance and dryness.  Respiratory: Negative for cough, shortness of breath and difficulty breathing.   Cardiovascular: Positive for swelling in legs/feet. Negative for chest pain, palpitations, hypertension and irregular heartbeat.  Gastrointestinal: Positive for constipation. Negative for blood in stool and diarrhea.  Endocrine: Negative for excessive thirst and increased urination.  Genitourinary: Negative for difficulty urinating and vaginal dryness.  Musculoskeletal: Positive for arthralgias, joint pain, myalgias, morning stiffness and myalgias. Negative for joint swelling, muscle weakness and muscle tenderness.  Skin: Positive for color change. Negative for rash, hair loss, skin tightness, ulcers and sensitivity to sunlight.  Allergic/Immunologic: Negative for susceptible to infections.  Neurological: Negative for dizziness, numbness, memory loss, night sweats and weakness.  Hematological: Negative for bruising/bleeding tendency and swollen glands.  Psychiatric/Behavioral: Negative for depressed mood and sleep disturbance. The patient is not nervous/anxious.     PMFS History:  Patient Active Problem List   Diagnosis Date Noted  . Left elbow pain 06/11/2016  . Achilles tendon pain 06/11/2016  . Melanoma of upper arm ([place]) 05/09/2013  .  Morbid obesity ([place]) 11/29/2012    Past Medical History:  Diagnosis Date  . Acid reflux   . Anxiety   . Depression   . Hypothyroidism   . Knee osteoarthritis    with degenerative medial meniscus tear  . Meniscus tear   . Osteoarthritis     Family  History  Problem Relation Age of Onset  . Hypertension Brother   . Cancer Paternal Grandmother        colon  . Liver cancer Paternal Grandmother   . Breast cancer Neg Hx    Past Surgical History:  Procedure Laterality Date  . CHOLECYSTECTOMY    . FOOT SURGERY     bilateral plantar fascitis  . HYSTEROSCOPY WITH D & C N/A 01/31/2013   Procedure: DILATATION AND CURETTAGE  TRU CLEAR /HYSTEROSCOPY;  Surgeon: Azalia Bilis, MD;  Location: [facility];  Service: Gynecology;  Laterality: N/A;  . MELANOMA EXCISION Right 05/17/2013   Procedure: WIDE EXCISION MELANOMA RIGHT UPPER ARM;  Surgeon: Harl Bowie, MD;  Location: [facility];  Service: General;  Laterality: Right;  . TONSILLECTOMY    . UPPER GI ENDOSCOPY  3/15   Social History   Social History Narrative  . Not on file   Immunization History  Administered Date(s) Administered  . Influenza-Unspecified 01/11/2018  . Tdap 08/01/2008     Objective: Vital Signs: BP (!) 161/82 (BP Location: Left Arm, Patient Position: Sitting, Cuff Size: Normal)   Pulse 79   Resp 18   Ht 5' 2.5" (1.588 m)   Wt 293 lb 6.4 oz (133.1 kg)   LMP 07/13/2017   BMI 52.81 kg/m    Physical Exam Vitals and nursing note reviewed.  Constitutional:      Appearance: She is well-developed.  HENT:     Head: Normocephalic and atraumatic.  Eyes:     Conjunctiva/sclera: Conjunctivae normal.  Cardiovascular:     Rate and Rhythm: Normal rate and regular rhythm.     Heart sounds: Normal heart sounds.  Pulmonary:     Effort: Pulmonary effort is normal.     Breath sounds: Normal breath sounds.  Abdominal:     General: Bowel sounds are normal.     Palpations: Abdomen is soft.  Musculoskeletal:     Cervical back: Normal range of motion.  Lymphadenopathy:     Cervical: No cervical adenopathy.  Skin:    General: Skin is warm and dry.     Capillary Refill: Capillary refill takes 2 to 3 seconds.     Comments: No rash or sclerodactyly was  noted.  No telangiectasias were noted.  Neurological:     Mental Status: She is alert and oriented to person, place, and time.  Psychiatric:        Behavior: Behavior normal.      Musculoskeletal Exam: C-spine was in good range of motion.  Shoulder joints, elbow joints, wrist joints, MCPs and PIPs with good range of motion.  She has mild DIP thickening.  She had no tenderness over right lateral epicondyle area.  Hip joints, knee joints, ankles with good range of motion.  Foot with postsurgical changes were noted in her feet.  CDAI Exam: CDAI Score: -- Patient Global: --; Provider Global: -- Swollen: --; Tender: -- Joint Exam 08/15/2019   No joint exam has been documented for this visit   There is currently no information documented on the homunculus. Go to the Rheumatology activity and complete the homunculus joint exam.  Investigation: No additional  findings.  Imaging: No results found.  Recent Labs: Lab Results  Component Value Date   WBC 6.1 01/31/2013   HGB 11.2 (L) 01/31/2013   PLT 230 01/31/2013   NA 140 01/17/2010   K 4.0 01/17/2010   CL 103 01/17/2010   GLUCOSE 87 01/17/2010   BUN 13 01/17/2010   CREATININE 0.6 01/17/2010    Speciality Comments: No specialty comments available.  Procedures:  No procedures performed Allergies: Bee venom   Assessment / Plan:     Visit Diagnoses: Raynaud's disease without gangrene -patient gives history of severe Raynaud's.  She brought pictures on her iPhone with her hands showing bluish discoloration.  She had decreased capillary refill.  No nailbed capillary changes are sclerodactyly was noted.  I will obtain following labs to complete the work-up.  If her work-up is negative I will refer her to vascular surgery for evaluation.  Plan: CBC with Differential/Platelet, COMPLETE METABOLIC PANEL WITH GFR, Sedimentation rate, CK, TSH, ANA, Pan-ANCA, Cryoglobulin, Anti-DNA antibody, double-stranded, Anti-Smith antibody, RNP Antibody,  Anti-scleroderma antibody, Sjogrens syndrome-A extractable nuclear antibody, Sjogrens syndrome-B extractable nuclear antibody, Beta-2 glycoprotein antibodies, Cardiolipin antibodies, IgG, IgM, IgA, Lupus Anticoagulant Eval w/Reflex, C3 and C4, Hepatitis B core antibody, IgM, Hepatitis B surface antigen, Hepatitis C antibody, Serum protein electrophoresis with reflex, IgG, IgA, IgM  Bilateral hand pain -the pain mostly correlates with the Raynaud's phenomenon.  She does have mild DIP thickening but she does not have discomfort due to osteoarthritis.  Right elbow pain-she initially had right elbow pain.  She states the discomfort has resolved.  She also had nerve conduction velocities which are consistent with carpal tunnel syndrome but she is asymptomatic.  Achilles tendon pain-she feels off-and-on discomfort in her Achilles tendon.  She had no evidence of Achilles tendinitis today.  History of bunionectomy of both great toes - And plantar fascial release  Malignant melanoma of right upper arm ([place]) - 2016, resection.[facility] dermatology.  History of hypothyroidism  History of gastroesophageal reflux (GERD)  Anxiety and depression  Orders: Orders Placed This Encounter  Procedures  . CBC with Differential/Platelet  . COMPLETE METABOLIC PANEL WITH GFR  . Sedimentation rate  . CK  . TSH  . ANA  . Pan-ANCA  . Cryoglobulin  . Anti-DNA antibody, double-stranded  . Anti-Smith antibody  . RNP Antibody  . Anti-scleroderma antibody  . Sjogrens syndrome-A extractable nuclear antibody  . Sjogrens syndrome-B extractable nuclear antibody  . Beta-2 glycoprotein antibodies  . Cardiolipin antibodies, IgG, IgM, IgA  . Lupus Anticoagulant Eval w/Reflex  . C3 and C4  . Hepatitis B core antibody, IgM  . Hepatitis B surface antigen  . Hepatitis C antibody  . Serum protein electrophoresis with reflex  . IgG, IgA, IgM   No orders of the defined types were placed in this encounter.   Face-to-face  time spent with patient was 50 minutes. Greater than 50% of time was spent in counseling and coordination of care.  Follow-Up Instructions: Return for Raynaud's phenomenon and arthralgias.   Bo Merino, MD  Note - This record has been created using Editor, commissioning.  Chart creation errors have been sought, but may not always  have been located. Such creation errors do not reflect on  the standard of medical care.

## 2019-08-13 DIAGNOSIS — F331 Major depressive disorder, recurrent, moderate: Secondary | ICD-10-CM | POA: Diagnosis not present

## 2019-08-15 ENCOUNTER — Ambulatory Visit: Payer: BC Managed Care – PPO | Admitting: Rheumatology

## 2019-08-15 ENCOUNTER — Other Ambulatory Visit: Payer: Self-pay

## 2019-08-15 ENCOUNTER — Encounter: Payer: Self-pay | Admitting: Rheumatology

## 2019-08-15 VITALS — BP 161/82 | HR 79 | Resp 18 | Ht 62.5 in | Wt 293.4 lb

## 2019-08-15 DIAGNOSIS — I7300 Raynaud's syndrome without gangrene: Secondary | ICD-10-CM

## 2019-08-15 DIAGNOSIS — Z9889 Other specified postprocedural states: Secondary | ICD-10-CM

## 2019-08-15 DIAGNOSIS — M25521 Pain in right elbow: Secondary | ICD-10-CM | POA: Diagnosis not present

## 2019-08-15 DIAGNOSIS — C4361 Malignant melanoma of right upper limb, including shoulder: Secondary | ICD-10-CM

## 2019-08-15 DIAGNOSIS — M79641 Pain in right hand: Secondary | ICD-10-CM | POA: Diagnosis not present

## 2019-08-15 DIAGNOSIS — F32A Depression, unspecified: Secondary | ICD-10-CM

## 2019-08-15 DIAGNOSIS — Z8639 Personal history of other endocrine, nutritional and metabolic disease: Secondary | ICD-10-CM

## 2019-08-15 DIAGNOSIS — F329 Major depressive disorder, single episode, unspecified: Secondary | ICD-10-CM

## 2019-08-15 DIAGNOSIS — M79642 Pain in left hand: Secondary | ICD-10-CM

## 2019-08-15 DIAGNOSIS — F419 Anxiety disorder, unspecified: Secondary | ICD-10-CM

## 2019-08-15 DIAGNOSIS — M7660 Achilles tendinitis, unspecified leg: Secondary | ICD-10-CM | POA: Diagnosis not present

## 2019-08-15 DIAGNOSIS — Z8719 Personal history of other diseases of the digestive system: Secondary | ICD-10-CM

## 2019-08-31 ENCOUNTER — Ambulatory Visit: Payer: BC Managed Care – PPO

## 2019-09-03 ENCOUNTER — Other Ambulatory Visit: Payer: Self-pay

## 2019-09-03 ENCOUNTER — Ambulatory Visit
Admission: RE | Admit: 2019-09-03 | Discharge: 2019-09-03 | Disposition: A | Discharge status: Home / Self Care | Payer: BC Managed Care – PPO | Source: Ambulatory Visit | Attending: Internal Medicine | Admitting: Internal Medicine

## 2019-09-03 DIAGNOSIS — Z1231 Encounter for screening mammogram for malignant neoplasm of breast: Secondary | ICD-10-CM | POA: Diagnosis not present

## 2019-09-05 DIAGNOSIS — F331 Major depressive disorder, recurrent, moderate: Secondary | ICD-10-CM | POA: Diagnosis not present

## 2019-09-10 NOTE — Progress Notes (Deleted)
Office Visit Note  Patient: Dominique Mcpherson             Date of Birth: 1966/02/19           MRN: [id number]             PCP: Leeroy Cha, MD Referring: Leeroy Cha,* Visit Date: 09/14/2019 Occupation: @GUAROCC @  Subjective:  No chief complaint on file.   History of Present Illness: Dominique Mcpherson is a 54 y.o. female ***   Activities of Daily Living:  Patient reports morning stiffness for *** {minute/hour:19697}.   Patient {ACTIONS;DENIES/REPORTS:21021675::"Denies"} nocturnal pain.  Difficulty dressing/grooming: {ACTIONS;DENIES/REPORTS:21021675::"Denies"} Difficulty climbing stairs: {ACTIONS;DENIES/REPORTS:21021675::"Denies"} Difficulty getting out of chair: {ACTIONS;DENIES/REPORTS:21021675::"Denies"} Difficulty using hands for taps, buttons, cutlery, and/or writing: {ACTIONS;DENIES/REPORTS:21021675::"Denies"}  No Rheumatology ROS completed.   PMFS History:  Patient Active Problem List   Diagnosis Date Noted  . Left elbow pain 06/11/2016  . Achilles tendon pain 06/11/2016  . Melanoma of upper arm ([place]) 05/09/2013  . Morbid obesity ([place]) 11/29/2012    Past Medical History:  Diagnosis Date  . Acid reflux   . Anxiety   . Depression   . Hypothyroidism   . Knee osteoarthritis    with degenerative medial meniscus tear  . Meniscus tear   . Osteoarthritis     Family History  Problem Relation Age of Onset  . Hypertension Brother   . Cancer Paternal Grandmother        colon  . Liver cancer Paternal Grandmother   . Breast cancer Neg Hx    Past Surgical History:  Procedure Laterality Date  . CHOLECYSTECTOMY    . FOOT SURGERY     bilateral plantar fascitis  . HYSTEROSCOPY WITH D & C N/A 01/31/2013   Procedure: DILATATION AND CURETTAGE  TRU CLEAR /HYSTEROSCOPY;  Surgeon: Azalia Bilis, MD;  Location: [facility];  Service: Gynecology;  Laterality: N/A;  . MELANOMA EXCISION Right 05/17/2013   Procedure: WIDE EXCISION MELANOMA RIGHT UPPER ARM;  Surgeon:  Harl Bowie, MD;  Location: [facility];  Service: General;  Laterality: Right;  . TONSILLECTOMY    . UPPER GI ENDOSCOPY  3/15   Social History   Social History Narrative  . Not on file   Immunization History  Administered Date(s) Administered  . Influenza-Unspecified 01/11/2018  . Tdap 08/01/2008     Objective: Vital Signs: LMP 07/13/2017    Physical Exam   Musculoskeletal Exam: ***  CDAI Exam: CDAI Score: -- Patient Global: --; Provider Global: -- Swollen: --; Tender: -- Joint Exam 09/14/2019   No joint exam has been documented for this visit   There is currently no information documented on the homunculus. Go to the Rheumatology activity and complete the homunculus joint exam.  Investigation: No additional findings.  Imaging: MM DIGITAL SCREENING BILATERAL  Result Date: 09/03/2019 CLINICAL DATA:  Screening. EXAM: DIGITAL SCREENING BILATERAL MAMMOGRAM WITH CAD COMPARISON:  Previous exam(s). ACR Breast Density Category a: The breast tissue is almost entirely fatty. FINDINGS: There are no findings suspicious for malignancy. Images were processed with CAD. IMPRESSION: No mammographic evidence of malignancy. A result letter of this screening mammogram will be mailed directly to the patient. RECOMMENDATION: Screening mammogram in one year. (Code:SM-B-01Y) BI-RADS CATEGORY  1: Negative. Electronically Signed   By: Ammie Ferrier M.D.   On: 09/03/2019 09:23    Recent Labs: Lab Results  Component Value Date   WBC 6.1 01/31/2013   HGB 11.2 (L) 01/31/2013   PLT 230 01/31/2013  NA 140 01/17/2010   K 4.0 01/17/2010   CL 103 01/17/2010   GLUCOSE 87 01/17/2010   BUN 13 01/17/2010   CREATININE 0.6 01/17/2010    Speciality Comments: No specialty comments available.  Procedures:  No procedures performed Allergies: Bee venom   Assessment / Plan:     Visit Diagnoses: No diagnosis found.  Orders: No orders of the defined types were placed in this  encounter.  No orders of the defined types were placed in this encounter.   Face-to-face time spent with patient was *** minutes. Greater than 50% of time was spent in counseling and coordination of care.  Follow-Up Instructions: No follow-ups on file.   Bo Merino, MD  Note - This record has been created using Editor, commissioning.  Chart creation errors have been sought, but may not always  have been located. Such creation errors do not reflect on  the standard of medical care.

## 2019-09-11 DIAGNOSIS — F331 Major depressive disorder, recurrent, moderate: Secondary | ICD-10-CM | POA: Diagnosis not present

## 2019-09-14 ENCOUNTER — Ambulatory Visit: Payer: BC Managed Care – PPO | Admitting: Rheumatology

## 2019-10-09 DIAGNOSIS — F331 Major depressive disorder, recurrent, moderate: Secondary | ICD-10-CM | POA: Diagnosis not present

## 2019-10-17 DIAGNOSIS — F331 Major depressive disorder, recurrent, moderate: Secondary | ICD-10-CM | POA: Diagnosis not present

## 2019-10-19 ENCOUNTER — Telehealth: Payer: Self-pay | Admitting: Orthopedic Surgery

## 2019-10-19 ENCOUNTER — Ambulatory Visit: Payer: Self-pay

## 2019-10-19 ENCOUNTER — Ambulatory Visit: Payer: BC Managed Care – PPO | Admitting: Surgical

## 2019-10-19 DIAGNOSIS — M79605 Pain in left leg: Secondary | ICD-10-CM

## 2019-10-19 DIAGNOSIS — M170 Bilateral primary osteoarthritis of knee: Secondary | ICD-10-CM

## 2019-10-19 NOTE — Telephone Encounter (Signed)
Noted  

## 2019-10-19 NOTE — Telephone Encounter (Signed)
Can we please get her approved for gel?

## 2019-10-19 NOTE — Telephone Encounter (Signed)
Pt called in stating it's time for another injection in her L knee and believes the shot she gets has to be approved by insurance first.

## 2019-10-22 ENCOUNTER — Telehealth: Payer: Self-pay

## 2019-10-22 NOTE — Telephone Encounter (Signed)
Submitted VOB for SynviscOne, left knee. 

## 2019-10-26 ENCOUNTER — Telehealth: Payer: Self-pay

## 2019-10-26 NOTE — Telephone Encounter (Signed)
PA required for SynviscOne, left knee. Faxed completed PA form to [employer] at [phone]. 

## 2019-10-29 ENCOUNTER — Encounter: Payer: Self-pay | Admitting: Orthopedic Surgery

## 2019-10-30 ENCOUNTER — Telehealth: Payer: Self-pay

## 2019-10-30 NOTE — Telephone Encounter (Signed)
Approved, SynviscOne, left knee. [place] Patient is covered at 100% after Co-pay. Co-pay of $100.00 required PA required PA Approval# [id number] Valid 10/26/2019- 04/23/2020

## 2019-10-31 ENCOUNTER — Ambulatory Visit: Payer: BC Managed Care – PPO | Admitting: Orthopedic Surgery

## 2019-10-31 ENCOUNTER — Encounter: Payer: Self-pay | Admitting: Surgical

## 2019-10-31 DIAGNOSIS — M170 Bilateral primary osteoarthritis of knee: Secondary | ICD-10-CM

## 2019-10-31 MED ORDER — BUPIVACAINE HCL 0.25 % IJ SOLN
4.0000 mL | INTRAMUSCULAR | Status: AC | PRN
  Administered 2019-10-31: 4 mL via INTRA_ARTICULAR

## 2019-10-31 MED ORDER — LIDOCAINE HCL 1 % IJ SOLN
5.0000 mL | INTRAMUSCULAR | Status: AC | PRN
  Administered 2019-10-31: 5 mL

## 2019-10-31 MED ORDER — METHYLPREDNISOLONE ACETATE 40 MG/ML IJ SUSP
40.0000 mg | INTRAMUSCULAR | Status: AC | PRN
  Administered 2019-10-31: 40 mg via INTRA_ARTICULAR

## 2019-10-31 NOTE — Progress Notes (Signed)
Office Visit Note   Patient: Dominique Mcpherson           Date of Birth: 1966/04/05           MRN: [id number] Visit Date: 10/19/2019 Requested by: Leeroy Cha, MD [address] [place],  [place] [address] PCP: Leeroy Cha, MD  Subjective: Chief Complaint  Patient presents with  . Knee Pain    HPI: Dominique Mcpherson is a 54 y.o. female who presents to the office complaining of left knee pain.  Pain is located diffusely throughout left knee.  Denies any injury.  No mechanical symptoms or instability.  Denies new radicular pain.She does note some groin pain but not a major concern.  No recent injections. She does not want to discuss any surgical options at this time.  She prefers more conservative management.                 ROS: All systems reviewed are negative as they relate to the chief complaint within the history of present illness.  Patient denies  fevers or chills.   Assessment & Plan: Visit Diagnoses:  1. Pain in left leg   2. Primary osteoarthritis of knees, bilateral     Plan: Patient is a 54 y.o. Female who presents with history of left knee OA. No new injury.  No mechanical symptoms or effusion on exam. No reproducible groin pain on exam. Hip XRs negative. Plan for cortisone injection today and preapprove for gel injection.  Follow-up for gel on approval.  Patient tolerated procedure well.   Follow-Up Instructions: No follow-ups on file.   Orders:  Orders Placed This Encounter  Procedures  . XR HIP UNILAT W OR W/O PELVIS 2-3 VIEWS LEFT   No orders of the defined types were placed in this encounter.     Procedures: Large Joint Inj: L knee on 10/31/2019 8:48 AM Indications: diagnostic evaluation, joint swelling and pain Details: 18 G 1.5 in needle, superolateral approach  Arthrogram: No  Medications: 5 mL lidocaine 1 %; 40 mg methylPREDNISolone acetate 40 MG/ML; 4 mL bupivacaine 0.25 % Outcome: tolerated well, no immediate  complications Procedure, treatment alternatives, risks and benefits explained, specific risks discussed. Consent was given by the patient. Immediately prior to procedure a time out was called to verify the correct patient, procedure, equipment, support staff and site/side marked as required. Patient was prepped and draped in the usual sterile fashion.       Clinical Data: No additional findings.  Objective: Vital Signs: LMP 07/13/2017   Physical Exam:    Constitutional: Patient appears well-developed HEENT:  Head: Normocephalic Eyes:EOM are normal Neck: Normal range of motion Cardiovascular: Normal rate Pulmonary/chest: Effort normal Neurologic: Patient is alert Skin: Skin is warm Psychiatric: Patient has normal mood and affect    Ortho Exam:  No effusion Extensor mechanism intact TTP over medial and lateral jointlines No TTP over quad tendon, patellar tendon, pes anserinus, patella, tibial tubercle, LCL/MCL insertions Stable to varus/valgus stresses.  Stable to anterior/posterior drawer Extension to 0 degrees Flexion > 90 degrees   Specialty Comments:  No specialty comments available.  Imaging: No results found.   PMFS History: Patient Active Problem List   Diagnosis Date Noted  . Left elbow pain 06/11/2016  . Achilles tendon pain 06/11/2016  . Melanoma of upper arm ([place]) 05/09/2013  . Morbid obesity ([place] [facility]) 11/29/2012   Past Medical History:  Diagnosis Date  . Acid reflux   . Anxiety   .  Depression   . Hypothyroidism   . Knee osteoarthritis    with degenerative medial meniscus tear  . Meniscus tear   . Osteoarthritis     Family History  Problem Relation Age of Onset  . Hypertension Brother   . Cancer Paternal Grandmother        colon  . Liver cancer Paternal Grandmother   . Breast cancer Neg Hx     Past Surgical History:  Procedure Laterality Date  . CHOLECYSTECTOMY    . FOOT SURGERY     bilateral plantar fascitis  . HYSTEROSCOPY WITH D &  C N/A 01/31/2013   Procedure: DILATATION AND CURETTAGE  TRU CLEAR /HYSTEROSCOPY;  Surgeon: Azalia Bilis, MD;  Location: [facility];  Service: Gynecology;  Laterality: N/A;  . MELANOMA EXCISION Right 05/17/2013   Procedure: WIDE EXCISION MELANOMA RIGHT UPPER ARM;  Surgeon: Harl Bowie, MD;  Location: [facility];  Service: General;  Laterality: Right;  . TONSILLECTOMY    . UPPER GI ENDOSCOPY  3/15   Social History   Occupational History  . Not on file  Tobacco Use  . Smoking status: Never Smoker  . Smokeless tobacco: Never Used  Vaping Use  . Vaping Use: Never used  Substance and Sexual Activity  . Alcohol use: Yes    Alcohol/week: 2.0 standard drinks    Types: 2 Standard drinks or equivalent per week    Comment: occ  . Drug use: No  . Sexual activity: Never    Partners: Male    Birth control/protection: Abstinence

## 2019-11-03 ENCOUNTER — Encounter: Payer: Self-pay | Admitting: Orthopedic Surgery

## 2019-11-03 DIAGNOSIS — M1712 Unilateral primary osteoarthritis, left knee: Secondary | ICD-10-CM | POA: Diagnosis not present

## 2019-11-03 MED ORDER — LIDOCAINE HCL 1 % IJ SOLN
5.0000 mL | INTRAMUSCULAR | Status: AC | PRN
  Administered 2019-11-03: 5 mL

## 2019-11-03 MED ORDER — HYLAN G-F 20 48 MG/6ML IX SOSY
48.0000 mg | PREFILLED_SYRINGE | INTRA_ARTICULAR | Status: AC | PRN
  Administered 2019-11-03: 48 mg via INTRA_ARTICULAR

## 2019-11-03 NOTE — Progress Notes (Signed)
   Procedure Note  Patient: Dominique Mcpherson             Date of Birth: 1965-10-31           MRN: [id number]             Visit Date: 10/31/2019  Procedures: Visit Diagnoses:  1. Primary osteoarthritis of knees, bilateral     Large Joint Inj: L knee on 11/03/2019 3:21 PM Indications: pain, joint swelling and diagnostic evaluation Details: 18 G 1.5 in needle, superolateral approach  Arthrogram: No  Medications: 5 mL lidocaine 1 %; 48 mg Hylan 48 MG/6ML Outcome: tolerated well, no immediate complications Procedure, treatment alternatives, risks and benefits explained, specific risks discussed. Consent was given by the patient. Immediately prior to procedure a time out was called to verify the correct patient, procedure, equipment, support staff and site/side marked as required. Patient was prepped and draped in the usual sterile fashion.

## 2019-11-13 DIAGNOSIS — F331 Major depressive disorder, recurrent, moderate: Secondary | ICD-10-CM | POA: Diagnosis not present

## 2019-11-14 ENCOUNTER — Telehealth: Payer: Self-pay | Admitting: Orthopedic Surgery

## 2019-11-14 ENCOUNTER — Encounter: Payer: Self-pay | Admitting: Orthopedic Surgery

## 2019-11-14 NOTE — Telephone Encounter (Signed)
Patient called advised her knee is hurting bad waking her up during the night. The knee has got worse since the injection. Patient asked if she can be worked into Dr [employer] schedule for sooner appointment? The number to contact patient is [phone]

## 2019-11-14 NOTE — Telephone Encounter (Signed)
May need to consider tramadol 1 po q 8 # 30 pls [facility]

## 2019-11-15 NOTE — Telephone Encounter (Signed)
Can you please work patient in next week?

## 2019-11-16 ENCOUNTER — Other Ambulatory Visit: Payer: Self-pay | Admitting: Surgical

## 2019-11-16 ENCOUNTER — Ambulatory Visit: Payer: BC Managed Care – PPO | Admitting: Orthopedic Surgery

## 2019-11-16 ENCOUNTER — Encounter: Payer: Self-pay | Admitting: Orthopedic Surgery

## 2019-11-16 ENCOUNTER — Telehealth: Payer: Self-pay

## 2019-11-16 DIAGNOSIS — M170 Bilateral primary osteoarthritis of knee: Secondary | ICD-10-CM

## 2019-11-16 MED ORDER — TRAMADOL HCL 50 MG PO TABS: 50.0000 mg | ORAL_TABLET | Freq: Every day | ORAL | 0 refills | Status: AC | PRN

## 2019-11-16 NOTE — Telephone Encounter (Signed)
Patient seen by Dr Marlou Sa needs Veatrice [facility] to [facility] on [address] submitted.

## 2019-11-16 NOTE — Telephone Encounter (Signed)
I called her.  She is going to come in today for injection

## 2019-11-17 ENCOUNTER — Encounter: Payer: Self-pay | Admitting: Orthopedic Surgery

## 2019-11-17 DIAGNOSIS — M170 Bilateral primary osteoarthritis of knee: Secondary | ICD-10-CM | POA: Diagnosis not present

## 2019-11-17 MED ORDER — LIDOCAINE HCL 1 % IJ SOLN
5.0000 mL | INTRAMUSCULAR | Status: AC | PRN
Start: 2019-11-17 — End: 2019-11-17
  Administered 2019-11-17: 5 mL

## 2019-11-17 MED ORDER — BUPIVACAINE HCL 0.25 % IJ SOLN
4.0000 mL | INTRAMUSCULAR | Status: AC | PRN
  Administered 2019-11-17: 4 mL via INTRA_ARTICULAR

## 2019-11-17 NOTE — Progress Notes (Signed)
Office Visit Note   Patient: Dominique Mcpherson           Date of Birth: [birth year]           MRN: [id number] Visit Date: 11/16/2019 Requested by: Leeroy Cha, MD [address] [place],  [place] [address] PCP: Leeroy Cha, MD  Subjective: Chief Complaint  Patient presents with  . Knee Pain    HPI: Dominique Mcpherson is a 54 year old patient with end-stage left knee arthritis.  She has had 2 injections 1 cortisone 1 of a gel injection in June.  Having increasing pain in that left knee.  No fevers or chills.  Patient has morbid obesity and end-stage varus gonarthrosis in that left knee.              ROS: All systems reviewed are negative as they relate to the chief complaint within the history of present illness.  Patient denies  fevers or chills.   Assessment & Plan: Visit Diagnoses: No diagnosis found.  Plan: Impression is left knee arthritis.  Just to attempt some pain relief we will try a Toradol injection today.  If that does not help then she may have really reached the end of the line in terms of her knee pain.  Last radiographs 6 months ago.  May need to get more radiographs if her pains persist but at her current weight she is really not a candidate for total knee replacement.  She is really between a rock and a hard place.  Follow-Up Instructions: Return if symptoms worsen or fail to improve.   Orders:  No orders of the defined types were placed in this encounter.  No orders of the defined types were placed in this encounter.     Procedures: Large Joint Inj: L knee on 11/17/2019 6:33 PM Indications: diagnostic evaluation, joint swelling and pain Details: 18 G 1.5 in needle, superolateral approach  Arthrogram: No  Medications: 5 mL lidocaine 1 %; 4 mL bupivacaine 0.25 % Outcome: tolerated well, no immediate complications Procedure, treatment alternatives, risks and benefits explained, specific risks discussed. Consent was given by the patient.  Immediately prior to procedure a time out was called to verify the correct patient, procedure, equipment, support staff and site/side marked as required. Patient was prepped and draped in the usual sterile fashion.     30 mg Toradol injected with the bupivacaine.  Clinical Data: No additional findings.  Objective: Vital Signs: LMP 07/13/2017   Physical Exam:   Constitutional: Patient appears well-developed HEENT:  Head: Normocephalic Eyes:EOM are normal Neck: Normal range of motion Cardiovascular: Normal rate Pulmonary/chest: Effort normal Neurologic: Patient is alert Skin: Skin is warm Psychiatric: Patient has normal mood and affect    Ortho Exam: Ortho exam demonstrates no definite effusion the left knee.  Examination of the left knee is difficult due to the soft tissue envelope.  Collaterals feel stable.  No warmth to the knee.  Extensor mechanism is intact.  No groin pain with internal extra rotation of the leg.  Specialty Comments:  No specialty comments available.  Imaging: No results found.   PMFS History: Patient Active Problem List   Diagnosis Date Noted  . Left elbow pain 06/11/2016  . Achilles tendon pain 06/11/2016  . Melanoma of upper arm ([facility]) 05/09/2013  . Morbid obesity ([facility]) 11/29/2012   Past Medical History:  Diagnosis Date  . Acid reflux   . Anxiety   . Depression   . Hypothyroidism   . Knee osteoarthritis  with degenerative medial meniscus tear  . Meniscus tear   . Osteoarthritis     Family History  Problem Relation Age of Onset  . Hypertension Brother   . Cancer Paternal Grandmother        colon  . Liver cancer Paternal Grandmother   . Breast cancer Neg Hx     Past Surgical History:  Procedure Laterality Date  . CHOLECYSTECTOMY    . FOOT SURGERY     bilateral plantar fascitis  . HYSTEROSCOPY WITH D & C N/A 01/31/2013   Procedure: DILATATION AND CURETTAGE  TRU CLEAR /HYSTEROSCOPY;  Surgeon: Azalia Bilis, MD;  Location: [facility];  Service: Gynecology;  Laterality: N/A;  . MELANOMA EXCISION Right 05/17/2013   Procedure: WIDE EXCISION MELANOMA RIGHT UPPER ARM;  Surgeon: Harl Bowie, MD;  Location: [facility];  Service: General;  Laterality: Right;  . TONSILLECTOMY    . UPPER GI ENDOSCOPY  3/15   Social History   Occupational History  . Not on file  Tobacco Use  . Smoking status: Never Smoker  . Smokeless tobacco: Never Used  Vaping Use  . Vaping Use: Never used  Substance and Sexual Activity  . Alcohol use: Yes    Alcohol/week: 2.0 standard drinks    Types: 2 Standard drinks or equivalent per week    Comment: occ  . Drug use: No  . Sexual activity: Never    Partners: Male    Birth control/protection: Abstinence

## 2019-11-20 DIAGNOSIS — F331 Major depressive disorder, recurrent, moderate: Secondary | ICD-10-CM | POA: Diagnosis not present

## 2019-11-22 ENCOUNTER — Encounter: Payer: Self-pay | Admitting: Orthopedic Surgery

## 2019-11-23 ENCOUNTER — Ambulatory Visit: Payer: BC Managed Care – PPO | Admitting: Surgical

## 2019-11-23 DIAGNOSIS — M170 Bilateral primary osteoarthritis of knee: Secondary | ICD-10-CM | POA: Diagnosis not present

## 2019-11-23 MED ORDER — CELECOXIB 100 MG PO CAPS: 100.0000 mg | ORAL_CAPSULE | Freq: Two times a day (BID) | ORAL | 0 refills | Status: DC

## 2019-11-25 ENCOUNTER — Encounter: Payer: Self-pay | Admitting: Surgical

## 2019-11-25 NOTE — Progress Notes (Signed)
Office Visit Note   Patient: Dominique Mcpherson           Date of Birth: 1965-08-17           MRN: [id number] Visit Date: 11/23/2019 Requested by: Leeroy Cha, MD [address] [place],  [place] [address] PCP: Leeroy Cha, MD  Subjective: Chief Complaint  Patient presents with  . Left Knee - Pain    HPI: Dominique Mcpherson is a 54 y.o. female who presents to the office complaining of left knee pain.  Patient had left knee Toradol injection about 1 week ago.  She notes that injection brought her "back to baseline".  She still complains of medial/anterior left knee pain but overall she is satisfied with the relief from the injection.  She is taking Aleve but she has had to reduce the amount she can take due to a history of gastritis.  She denies any groin pain.  Left knee bothers her more than her right knee.  She is not interested in any sort of surgical intervention for her knee pain at this point.  Cortisone injections typically provide her 2 to 3 days of relief.  Gel injections usually provide more relief but the last ones did not give any relief for her..                ROS: All systems reviewed are negative as they relate to the chief complaint within the history of present illness.  Patient denies fevers or chills.  Assessment & Plan: Visit Diagnoses:  1. Primary osteoarthritis of knees, bilateral     Plan: Patient is a 54 year old female who presents complaining of left knee pain.  Pain is returned to baseline following Toradol injection about 1 week ago.  She is not interested in any surgical intervention for her knee pain at this time.  However, plan to switch patient from Aleve to Celebrex for management of her knee pain due to her history of gastritis.  Patient agreed with this plan.  Celebrex 100 mg twice daily was prescribed.  Patient will follow up as needed.  Follow-Up Instructions: No follow-ups on file.   Orders:  No orders of the defined types  were placed in this encounter.  Meds ordered this encounter  Medications  . celecoxib (CELEBREX) 100 MG capsule    Sig: Take 1 capsule (100 mg total) by mouth 2 (two) times daily.    Dispense:  60 capsule    Refill:  0      Procedures: No procedures performed   Clinical Data: No additional findings.  Objective: Vital Signs: LMP 07/13/2017   Physical Exam:  Constitutional: Patient appears well-developed HEENT:  Head: Normocephalic Eyes:EOM are normal Neck: Normal range of motion Cardiovascular: Normal rate Pulmonary/chest: Effort normal Neurologic: Patient is alert Skin: Skin is warm Psychiatric: Patient has normal mood and affect  Ortho Exam: Ortho exam demonstrates left knee with intact extensor mechanism.  Tenderness to palpation primarily over the medial joint line.  Also tender over the lateral joint line and pes anserine bursa.  No significant pain with internal rotation/external rotation of the left hip joint.  Specialty Comments:  No specialty comments available.  Imaging: No results found.   PMFS History: Patient Active Problem List   Diagnosis Date Noted  . Left elbow pain 06/11/2016  . Achilles tendon pain 06/11/2016  . Melanoma of upper arm ([facility]) 05/09/2013  . Morbid obesity ([facility]) 11/29/2012   Past Medical History:  Diagnosis Date  .  Acid reflux   . Anxiety   . Depression   . Hypothyroidism   . Knee osteoarthritis    with degenerative medial meniscus tear  . Meniscus tear   . Osteoarthritis     Family History  Problem Relation Age of Onset  . Hypertension Brother   . Cancer Paternal Grandmother        colon  . Liver cancer Paternal Grandmother   . Breast cancer Neg Hx     Past Surgical History:  Procedure Laterality Date  . CHOLECYSTECTOMY    . FOOT SURGERY     bilateral plantar fascitis  . HYSTEROSCOPY WITH D & C N/A 01/31/2013   Procedure: DILATATION AND CURETTAGE  TRU CLEAR /HYSTEROSCOPY;  Surgeon: Azalia Bilis, MD;  Location:  [facility];  Service: Gynecology;  Laterality: N/A;  . MELANOMA EXCISION Right 05/17/2013   Procedure: WIDE EXCISION MELANOMA RIGHT UPPER ARM;  Surgeon: Harl Bowie, MD;  Location: [facility];  Service: General;  Laterality: Right;  . TONSILLECTOMY    . UPPER GI ENDOSCOPY  3/15   Social History   Occupational History  . Not on file  Tobacco Use  . Smoking status: Never Smoker  . Smokeless tobacco: Never Used  Vaping Use  . Vaping Use: Never used  Substance and Sexual Activity  . Alcohol use: Yes    Alcohol/week: 2.0 standard drinks    Types: 2 Standard drinks or equivalent per week    Comment: occ  . Drug use: No  . Sexual activity: Never    Partners: Male    Birth control/protection: Abstinence

## 2019-11-27 DIAGNOSIS — Z79899 Other long term (current) drug therapy: Secondary | ICD-10-CM | POA: Diagnosis not present

## 2019-11-27 DIAGNOSIS — F331 Major depressive disorder, recurrent, moderate: Secondary | ICD-10-CM | POA: Diagnosis not present

## 2019-11-27 DIAGNOSIS — F338 Other recurrent depressive disorders: Secondary | ICD-10-CM | POA: Diagnosis not present

## 2019-11-27 DIAGNOSIS — F902 Attention-deficit hyperactivity disorder, combined type: Secondary | ICD-10-CM | POA: Diagnosis not present

## 2019-11-27 DIAGNOSIS — H93299 Other abnormal auditory perceptions, unspecified ear: Secondary | ICD-10-CM | POA: Diagnosis not present

## 2019-12-04 DIAGNOSIS — F331 Major depressive disorder, recurrent, moderate: Secondary | ICD-10-CM | POA: Diagnosis not present

## 2019-12-11 DIAGNOSIS — F331 Major depressive disorder, recurrent, moderate: Secondary | ICD-10-CM | POA: Diagnosis not present

## 2019-12-16 DIAGNOSIS — Z20822 Contact with and (suspected) exposure to covid-19: Secondary | ICD-10-CM | POA: Diagnosis not present

## 2019-12-30 ENCOUNTER — Other Ambulatory Visit: Payer: Self-pay | Admitting: Surgical

## 2019-12-31 NOTE — Telephone Encounter (Signed)
Please advise 

## 2020-01-01 DIAGNOSIS — F331 Major depressive disorder, recurrent, moderate: Secondary | ICD-10-CM | POA: Diagnosis not present

## 2020-01-16 DIAGNOSIS — F331 Major depressive disorder, recurrent, moderate: Secondary | ICD-10-CM | POA: Diagnosis not present

## 2020-02-02 DIAGNOSIS — Z20822 Contact with and (suspected) exposure to covid-19: Secondary | ICD-10-CM | POA: Diagnosis not present

## 2020-02-02 DIAGNOSIS — J0110 Acute frontal sinusitis, unspecified: Secondary | ICD-10-CM | POA: Diagnosis not present

## 2020-02-07 DIAGNOSIS — F331 Major depressive disorder, recurrent, moderate: Secondary | ICD-10-CM | POA: Diagnosis not present

## 2020-02-13 DIAGNOSIS — F331 Major depressive disorder, recurrent, moderate: Secondary | ICD-10-CM | POA: Diagnosis not present

## 2020-02-19 DIAGNOSIS — F331 Major depressive disorder, recurrent, moderate: Secondary | ICD-10-CM | POA: Diagnosis not present

## 2020-03-05 DIAGNOSIS — F331 Major depressive disorder, recurrent, moderate: Secondary | ICD-10-CM | POA: Diagnosis not present

## 2020-03-12 DIAGNOSIS — D649 Anemia, unspecified: Secondary | ICD-10-CM | POA: Diagnosis not present

## 2020-03-12 DIAGNOSIS — F331 Major depressive disorder, recurrent, moderate: Secondary | ICD-10-CM | POA: Diagnosis not present

## 2020-03-12 DIAGNOSIS — E039 Hypothyroidism, unspecified: Secondary | ICD-10-CM | POA: Diagnosis not present

## 2020-03-12 DIAGNOSIS — Z23 Encounter for immunization: Secondary | ICD-10-CM | POA: Diagnosis not present

## 2020-03-12 DIAGNOSIS — Z79899 Other long term (current) drug therapy: Secondary | ICD-10-CM | POA: Diagnosis not present

## 2020-03-12 DIAGNOSIS — R413 Other amnesia: Secondary | ICD-10-CM | POA: Diagnosis not present

## 2020-03-19 DIAGNOSIS — F331 Major depressive disorder, recurrent, moderate: Secondary | ICD-10-CM | POA: Diagnosis not present

## 2020-03-24 ENCOUNTER — Ambulatory Visit: Payer: BC Managed Care – PPO | Admitting: Orthopedic Surgery

## 2020-03-24 ENCOUNTER — Telehealth: Payer: Self-pay

## 2020-03-24 ENCOUNTER — Other Ambulatory Visit: Payer: Self-pay

## 2020-03-24 DIAGNOSIS — M170 Bilateral primary osteoarthritis of knee: Secondary | ICD-10-CM

## 2020-03-24 NOTE — Telephone Encounter (Signed)
Noted  

## 2020-03-24 NOTE — Telephone Encounter (Signed)
auth needed for left knee gel injection.

## 2020-03-30 ENCOUNTER — Encounter: Payer: Self-pay | Admitting: Orthopedic Surgery

## 2020-03-30 DIAGNOSIS — M1712 Unilateral primary osteoarthritis, left knee: Secondary | ICD-10-CM

## 2020-03-30 MED ORDER — BUPIVACAINE HCL 0.25 % IJ SOLN
4.0000 mL | INTRAMUSCULAR | Status: AC | PRN
  Administered 2020-03-30: 4 mL via INTRA_ARTICULAR

## 2020-03-30 MED ORDER — LIDOCAINE HCL 1 % IJ SOLN
5.0000 mL | INTRAMUSCULAR | Status: AC | PRN
Start: 2020-03-30 — End: 2020-03-30
  Administered 2020-03-30: 5 mL

## 2020-03-30 MED ORDER — METHYLPREDNISOLONE ACETATE 40 MG/ML IJ SUSP
40.0000 mg | INTRAMUSCULAR | Status: AC | PRN
  Administered 2020-03-30: 40 mg via INTRA_ARTICULAR

## 2020-03-30 NOTE — Progress Notes (Signed)
Office Visit Note   Patient: Dominique Mcpherson           Date of Birth: Nov 02, 1965           MRN: [id number] Visit Date: 03/24/2020 Requested by: Leeroy Cha, MD [address] [place],  [place] [address] PCP: Leeroy Cha, MD  Subjective: Chief Complaint  Patient presents with  . Left Knee - Pain    HPI: Dominique Mcpherson is a 54 year old patient with left knee arthritis.  Increased BMI will prevent her from getting a knee replacement anytime soon.  Gel and cortisone injections have helped her in the past.  She is requesting a gel injection in the near future but cortisone injection today.  Denies any interval history of injury.  She is able to walk but pays a heavy price for prolonged standing and ambulation in terms of pain.              ROS: All systems reviewed are negative as they relate to the chief complaint within the history of present illness.  Patient denies  fevers or chills.   Assessment & Plan: Visit Diagnoses:  1. Primary osteoarthritis of knees, bilateral     Plan: Impression is severe end-stage left knee arthritis in a patient with increased BMI.  Injection performed today.  Preapproved gel.  Follow-up when the cortisone injection wears off.  Continue with nonweightbearing quad strengthening exercises and weight loss is much as possible. This patient is diagnosed with osteoarthritis of the knee(s).    Radiographs show evidence of joint space narrowing, osteophytes, subchondral sclerosis and/or subchondral cysts.  This patient has knee pain which interferes with functional and activities of daily living.    This patient has experienced inadequate response, adverse effects and/or intolerance with conservative treatments such as acetaminophen, NSAIDS, topical creams, physical therapy or regular exercise, knee bracing and/or weight loss.   This patient has experienced inadequate response or has a contraindication to intra articular steroid injections for  at least 3 months.   This patient is not scheduled to have a total knee replacement within 6 months of starting treatment with viscosupplementation.   Follow-Up Instructions: Return if symptoms worsen or fail to improve.   Orders:  No orders of the defined types were placed in this encounter.  No orders of the defined types were placed in this encounter.     Procedures: Large Joint Inj: L knee on 03/30/2020 6:23 PM Indications: diagnostic evaluation, joint swelling and pain Details: 18 G 1.5 in needle, superolateral approach  Arthrogram: No  Medications: 5 mL lidocaine 1 %; 40 mg methylPREDNISolone acetate 40 MG/ML; 4 mL bupivacaine 0.25 % Outcome: tolerated well, no immediate complications Procedure, treatment alternatives, risks and benefits explained, specific risks discussed. Consent was given by the patient. Immediately prior to procedure a time out was called to verify the correct patient, procedure, equipment, support staff and site/side marked as required. Patient was prepped and draped in the usual sterile fashion.       Clinical Data: No additional findings.  Objective: Vital Signs: LMP 07/13/2017   Physical Exam:   Constitutional: Patient appears well-developed HEENT:  Head: Normocephalic Eyes:EOM are normal Neck: Normal range of motion Cardiovascular: Normal rate Pulmonary/chest: Effort normal Neurologic: Patient is alert Skin: Skin is warm Psychiatric: Patient has normal mood and affect    Ortho Exam: Ortho exam demonstrates increased BMI..  Patient has intact extensor mechanism.  Not too much of an effusion.  Antalgic gait to the left.  No groin pain with internal extra rotation of the leg.  Specialty Comments:  No specialty comments available.  Imaging: No results found.   PMFS History: Patient Active Problem List   Diagnosis Date Noted  . Left elbow pain 06/11/2016  . Achilles tendon pain 06/11/2016  . Melanoma of upper arm ([facility])  05/09/2013  . Morbid obesity ([facility]) 11/29/2012   Past Medical History:  Diagnosis Date  . Acid reflux   . Anxiety   . Depression   . Hypothyroidism   . Knee osteoarthritis    with degenerative medial meniscus tear  . Meniscus tear   . Osteoarthritis     Family History  Problem Relation Age of Onset  . Hypertension Brother   . Cancer Paternal Grandmother        colon  . Liver cancer Paternal Grandmother   . Breast cancer Neg Hx     Past Surgical History:  Procedure Laterality Date  . CHOLECYSTECTOMY    . FOOT SURGERY     bilateral plantar fascitis  . HYSTEROSCOPY WITH D & C N/A 01/31/2013   Procedure: DILATATION AND CURETTAGE  TRU CLEAR /HYSTEROSCOPY;  Surgeon: Azalia Bilis, MD;  Location: [facility];  Service: Gynecology;  Laterality: N/A;  . MELANOMA EXCISION Right 05/17/2013   Procedure: WIDE EXCISION MELANOMA RIGHT UPPER ARM;  Surgeon: Harl Bowie, MD;  Location: [facility];  Service: General;  Laterality: Right;  . TONSILLECTOMY    . UPPER GI ENDOSCOPY  3/15   Social History   Occupational History  . Not on file  Tobacco Use  . Smoking status: Never Smoker  . Smokeless tobacco: Never Used  Vaping Use  . Vaping Use: Never used  Substance and Sexual Activity  . Alcohol use: Yes    Alcohol/week: 2.0 standard drinks    Types: 2 Standard drinks or equivalent per week    Comment: occ  . Drug use: No  . Sexual activity: Never    Partners: Male    Birth control/protection: Abstinence

## 2020-03-31 NOTE — Progress Notes (Signed)
Duplicate

## 2020-04-01 DIAGNOSIS — F331 Major depressive disorder, recurrent, moderate: Secondary | ICD-10-CM | POA: Diagnosis not present

## 2020-04-01 NOTE — Telephone Encounter (Signed)
Next available gel injection for left knee would need to be after 05/01/2020.

## 2020-04-03 ENCOUNTER — Other Ambulatory Visit: Payer: Self-pay | Admitting: Surgical

## 2020-04-09 DIAGNOSIS — F331 Major depressive disorder, recurrent, moderate: Secondary | ICD-10-CM | POA: Diagnosis not present

## 2020-04-16 DIAGNOSIS — F331 Major depressive disorder, recurrent, moderate: Secondary | ICD-10-CM | POA: Diagnosis not present

## 2020-04-21 ENCOUNTER — Encounter: Payer: Self-pay | Admitting: Orthopedic Surgery

## 2020-04-21 NOTE — Telephone Encounter (Signed)
Talked with patient concerning gel injection.  

## 2020-04-22 DIAGNOSIS — Z03818 Encounter for observation for suspected exposure to other biological agents ruled out: Secondary | ICD-10-CM | POA: Diagnosis not present

## 2020-04-22 DIAGNOSIS — J029 Acute pharyngitis, unspecified: Secondary | ICD-10-CM | POA: Diagnosis not present

## 2020-04-22 DIAGNOSIS — Z20822 Contact with and (suspected) exposure to covid-19: Secondary | ICD-10-CM | POA: Diagnosis not present

## 2020-04-23 DIAGNOSIS — Z20822 Contact with and (suspected) exposure to covid-19: Secondary | ICD-10-CM | POA: Diagnosis not present

## 2020-04-25 DIAGNOSIS — J040 Acute laryngitis: Secondary | ICD-10-CM | POA: Diagnosis not present

## 2020-04-25 DIAGNOSIS — J029 Acute pharyngitis, unspecified: Secondary | ICD-10-CM | POA: Diagnosis not present

## 2020-04-27 DIAGNOSIS — Z20822 Contact with and (suspected) exposure to covid-19: Secondary | ICD-10-CM | POA: Diagnosis not present

## 2020-04-29 DIAGNOSIS — H6980 Other specified disorders of Eustachian tube, unspecified ear: Secondary | ICD-10-CM | POA: Diagnosis not present

## 2020-04-29 DIAGNOSIS — H6692 Otitis media, unspecified, left ear: Secondary | ICD-10-CM | POA: Diagnosis not present

## 2020-05-01 ENCOUNTER — Ambulatory Visit: Payer: BC Managed Care – PPO | Admitting: Surgical

## 2020-05-07 ENCOUNTER — Telehealth: Payer: Self-pay

## 2020-05-07 ENCOUNTER — Ambulatory Visit: Payer: BC Managed Care – PPO | Admitting: Certified Nurse Midwife

## 2020-05-07 DIAGNOSIS — F331 Major depressive disorder, recurrent, moderate: Secondary | ICD-10-CM | POA: Diagnosis not present

## 2020-05-07 NOTE — Telephone Encounter (Signed)
Submitted VOB, SynviscOne, left knee. 

## 2020-05-09 DIAGNOSIS — Z79899 Other long term (current) drug therapy: Secondary | ICD-10-CM | POA: Diagnosis not present

## 2020-05-09 DIAGNOSIS — H93299 Other abnormal auditory perceptions, unspecified ear: Secondary | ICD-10-CM | POA: Diagnosis not present

## 2020-05-09 DIAGNOSIS — F902 Attention-deficit hyperactivity disorder, combined type: Secondary | ICD-10-CM | POA: Diagnosis not present

## 2020-05-09 DIAGNOSIS — F338 Other recurrent depressive disorders: Secondary | ICD-10-CM | POA: Diagnosis not present

## 2020-05-12 ENCOUNTER — Telehealth: Payer: Self-pay

## 2020-05-12 NOTE — Telephone Encounter (Signed)
PA required for SynviscOne, Left Knee. Faxed completed PA form to [facility] at [phone].

## 2020-05-13 ENCOUNTER — Encounter: Payer: Self-pay | Admitting: Orthopedic Surgery

## 2020-05-14 ENCOUNTER — Ambulatory Visit: Payer: BC Managed Care – PPO | Admitting: Orthopedic Surgery

## 2020-05-14 DIAGNOSIS — F331 Major depressive disorder, recurrent, moderate: Secondary | ICD-10-CM | POA: Diagnosis not present

## 2020-05-15 ENCOUNTER — Telehealth: Payer: Self-pay

## 2020-05-15 NOTE — Telephone Encounter (Signed)
Are you able to dictate this?

## 2020-05-15 NOTE — Telephone Encounter (Signed)
An updated note is needed for approval for SynviscOne injection.   PA will be approved if patient has less pain, has improvement in activities of daily living, and using fewer pain medications since the last knee injection.  Please advise.  Thank you.

## 2020-05-16 ENCOUNTER — Telehealth: Payer: Self-pay

## 2020-05-16 NOTE — Telephone Encounter (Signed)
Updated note faxed.  See previous message.

## 2020-05-16 NOTE — Telephone Encounter (Signed)
When I saw Dominique Mcpherson last office visit she did describe improvement with gel injections with ability to do more of her normal day-to-day activities.  She also describes using less anti-inflammatory pain medicine when the gel is in effect.  She does describe less pain with the gel injections but that effect does wear off after 6 months.

## 2020-05-16 NOTE — Telephone Encounter (Signed)
See below

## 2020-05-16 NOTE — Telephone Encounter (Signed)
Faxed updated office note to [facility] at [phone] for PA for SynviscOne, left knee.

## 2020-05-19 ENCOUNTER — Ambulatory Visit: Payer: BC Managed Care – PPO | Admitting: Orthopedic Surgery

## 2020-05-21 ENCOUNTER — Telehealth: Payer: Self-pay

## 2020-05-21 NOTE — Telephone Encounter (Signed)
Talked with patient and advised her that synviscOne was approved by her insurance.  Approved, SynviscOne, left knee. [place] Covered at 100% after Co-pay Co-pay of $100.00 required PA required PA Approval# [id number] Valid 05/12/2020- 11/08/2020  Appt. 05/26/2020 with Dr. Marlou Sa

## 2020-05-26 ENCOUNTER — Ambulatory Visit: Payer: BC Managed Care – PPO | Admitting: Orthopedic Surgery

## 2020-05-26 ENCOUNTER — Ambulatory Visit: Payer: Self-pay

## 2020-05-26 ENCOUNTER — Other Ambulatory Visit: Payer: Self-pay

## 2020-05-26 DIAGNOSIS — M25562 Pain in left knee: Secondary | ICD-10-CM

## 2020-05-26 DIAGNOSIS — M170 Bilateral primary osteoarthritis of knee: Secondary | ICD-10-CM | POA: Diagnosis not present

## 2020-05-31 ENCOUNTER — Encounter: Payer: Self-pay | Admitting: Orthopedic Surgery

## 2020-05-31 NOTE — Progress Notes (Signed)
Office Visit Note   Patient: Dominique Mcpherson           Date of Birth: 03/24/1966           MRN: [id number] Visit Date: 05/26/2020 Requested by: Leeroy Cha, MD [address] [place],  [place] [address] PCP: Leeroy Cha, MD  Subjective: Chief Complaint  Patient presents with   Left Knee - Pain    HPI: Dominique Mcpherson is a 55 y.o. female who presents to the office complaining of left knee and proximal tibia pain.  Patient has history of severe left knee osteoarthritis.  She has increased pain over the last 4 days in the medial proximal tibia.  Denies any history of recent injury.  She feels the pain is worsening.  She states it "feels different than arthritis pain".  No significant groin pain or radicular pain.  No numbness or tingling..                ROS: All systems reviewed are negative as they relate to the chief complaint within the history of present illness.  Patient denies fevers or chills.  Assessment & Plan: Visit Diagnoses:  1. Left knee pain, unspecified chronicity   2. Primary osteoarthritis of knees, bilateral     Plan: Patient is a 55 year old female who presents complaining of left knee and left tibial pain.  She has history of left knee osteoarthritis.  She has had 4 days of increased pain in the proximal tibia.  Radiographs are negative for any suggestion of stress fracture.  Impression is left knee pain with radiation of pain from the knee arthritis down into the tibia.  She presents today for Synvisc 1 injection.  Injection administered and patient tolerated the procedure well.  Plan to follow-up as needed.  Next injection can be a cortisone injection in 3 months.  If she has this continually worsening tibial shaft pain, may consider MRI scan to rule out stress reaction/fracture.  Patient agreed with this plan.  Follow-Up Instructions: No follow-ups on file.   Orders:  Orders Placed This Encounter  Procedures   XR Knee 1-2 Views Left    XR Tibia/Fibula Left   No orders of the defined types were placed in this encounter.     Procedures: Large Joint Inj: L knee on 06/01/2020 8:13 AM Indications: pain, joint swelling and diagnostic evaluation Details: 18 G 1.5 in needle, superolateral approach  Arthrogram: No  Medications: 5 mL lidocaine 1 %; 48 mg Hylan 48 MG/6ML Outcome: tolerated well, no immediate complications Procedure, treatment alternatives, risks and benefits explained, specific risks discussed. Consent was given by the patient. Immediately prior to procedure a time out was called to verify the correct patient, procedure, equipment, support staff and site/side marked as required. Patient was prepped and draped in the usual sterile fashion.       Clinical Data: No additional findings.  Objective: Vital Signs: LMP 07/13/2017   Physical Exam:  Constitutional: Patient appears well-developed HEENT:  Head: Normocephalic Eyes:EOM are normal Neck: Normal range of motion Cardiovascular: Normal rate Pulmonary/chest: Effort normal Neurologic: Patient is alert Skin: Skin is warm Psychiatric: Patient has normal mood and affect  Ortho Exam: Ortho exam demonstrates left knee with tenderness over the medial joint line and lateral joint line.  Tenderness over the medial proximal tibia.  No tenderness over the fibular head.  No calf tenderness.  No significant effusion.  Able to perform straight leg raise.  No significant pain with  hip flexion, internal rotation, external rotation passively.  Negative Stinchfield exam.  Negative straight leg raise.  No focal swelling compared with the contralateral shin.  Specialty Comments:  No specialty comments available.  Imaging: No results found.   PMFS History: Patient Active Problem List   Diagnosis Date Noted   Left elbow pain 06/11/2016   Achilles tendon pain 06/11/2016   Melanoma of upper arm ([place]) 05/09/2013   Morbid obesity ([place]) 11/29/2012   Past  Medical History:  Diagnosis Date   Acid reflux    Anxiety    Depression    Hypothyroidism    Knee osteoarthritis    with degenerative medial meniscus tear   Meniscus tear    Osteoarthritis     Family History  Problem Relation Age of Onset   Hypertension Brother    Cancer Paternal Grandmother        colon   Liver cancer Paternal Grandmother    Breast cancer Neg Hx     Past Surgical History:  Procedure Laterality Date   CHOLECYSTECTOMY     FOOT SURGERY     bilateral plantar fascitis   HYSTEROSCOPY WITH D & C N/A 01/31/2013   Procedure: DILATATION AND CURETTAGE  TRU CLEAR /HYSTEROSCOPY;  Surgeon: Azalia Bilis, MD;  Location: [facility];  Service: Gynecology;  Laterality: N/A;   MELANOMA EXCISION Right 05/17/2013   Procedure: WIDE EXCISION MELANOMA RIGHT UPPER ARM;  Surgeon: Harl Bowie, MD;  Location: [facility];  Service: General;  Laterality: Right;   TONSILLECTOMY     UPPER GI ENDOSCOPY  3/15   Social History   Occupational History   Not on file  Tobacco Use   Smoking status: Never Smoker   Smokeless tobacco: Never Used  Vaping Use   Vaping Use: Never used  Substance and Sexual Activity   Alcohol use: Yes    Alcohol/week: 2.0 standard drinks    Types: 2 Standard drinks or equivalent per week    Comment: occ   Drug use: No   Sexual activity: Never    Partners: Male    Birth control/protection: Abstinence

## 2020-06-01 ENCOUNTER — Encounter: Payer: Self-pay | Admitting: Orthopedic Surgery

## 2020-06-01 DIAGNOSIS — M170 Bilateral primary osteoarthritis of knee: Secondary | ICD-10-CM | POA: Diagnosis not present

## 2020-06-01 MED ORDER — LIDOCAINE HCL 1 % IJ SOLN
5.0000 mL | INTRAMUSCULAR | Status: AC | PRN
  Administered 2020-06-01: 5 mL

## 2020-06-01 MED ORDER — HYLAN G-F 20 48 MG/6ML IX SOSY
48.0000 mg | PREFILLED_SYRINGE | INTRA_ARTICULAR | Status: AC | PRN
  Administered 2020-06-01: 48 mg via INTRA_ARTICULAR

## 2020-06-04 DIAGNOSIS — F331 Major depressive disorder, recurrent, moderate: Secondary | ICD-10-CM | POA: Diagnosis not present

## 2020-06-11 DIAGNOSIS — F331 Major depressive disorder, recurrent, moderate: Secondary | ICD-10-CM | POA: Diagnosis not present

## 2020-06-18 DIAGNOSIS — F331 Major depressive disorder, recurrent, moderate: Secondary | ICD-10-CM | POA: Diagnosis not present

## 2020-06-18 DIAGNOSIS — Z20822 Contact with and (suspected) exposure to covid-19: Secondary | ICD-10-CM | POA: Diagnosis not present

## 2020-07-01 ENCOUNTER — Encounter: Payer: Self-pay | Admitting: Family Medicine

## 2020-07-01 ENCOUNTER — Ambulatory Visit: Payer: Self-pay

## 2020-07-01 ENCOUNTER — Other Ambulatory Visit: Payer: Self-pay

## 2020-07-01 ENCOUNTER — Ambulatory Visit: Payer: BC Managed Care – PPO | Admitting: Family Medicine

## 2020-07-01 VITALS — BP 136/84 | HR 76 | Ht 62.5 in | Wt 313.0 lb

## 2020-07-01 DIAGNOSIS — M1710 Unilateral primary osteoarthritis, unspecified knee: Secondary | ICD-10-CM | POA: Insufficient documentation

## 2020-07-01 DIAGNOSIS — M1712 Unilateral primary osteoarthritis, left knee: Secondary | ICD-10-CM

## 2020-07-01 DIAGNOSIS — G8929 Other chronic pain: Secondary | ICD-10-CM | POA: Diagnosis not present

## 2020-07-01 DIAGNOSIS — M25562 Pain in left knee: Secondary | ICD-10-CM | POA: Diagnosis not present

## 2020-07-01 DIAGNOSIS — M179 Osteoarthritis of knee, unspecified: Secondary | ICD-10-CM | POA: Insufficient documentation

## 2020-07-01 NOTE — Progress Notes (Signed)
Subjective:    CC: L knee and leg pain  I, Judy Pimple, am serving as a scribe for Dr. Lynne Leader.  HPI: Pt is a 55 y/o female presenting w/ c/o chronic L knee and leg pain.  She was seen by Dr. Marlou Sa at [facility] on 05/26/20 w/ a flare-up of L medial knee and proximal tibia pain due to "severe left knee osteoarthritis."  She had a L knee Synvisc 1 injection at that visit w/ plan to repeat w/ a cortisone injection in 3 months if needed per OV note.  Today, pt reports that she is worried that she has trained her self to walk with a still leg and has noticed that is causing a lot of other discomfort around the front and top of knee. Patient is wanting help on how she can walk without that stiff knee. States that the synvisc this last time did not help nearly as much as it has in the past. Knee feels so stiff and weak that when she goes to stand she feels unstable and feels like she feels like she 35% slower than she usually moves.   L knee swelling: not sure  L knee mechanical symptoms: yes  Aggravating factors: bending leg, sitting to standing  Treatments tried: Synvisc 1 injection on 05/26/20; Aleve; elevation   Diagnostic imaging: L knee and L tib/fib XR- 05/26/20; L and R knee XR- 04/23/19  Pertinent review of Systems: No fevers or chills  Relevant historical information: Morbid obesity   Objective:    Vitals:   07/01/20 1511  BP: 136/84  Pulse: 76  SpO2: 98%    Body mass index is 56.34 kg/m. Wt Readings from Last 5 Encounters:  07/01/20 (!) 313 lb (142 kg)  08/15/19 293 lb 6.4 oz (133.1 kg)  05/01/19 294 lb (133.4 kg)  04/23/19 284 lb (128.8 kg)  11/02/18 300 lb (136.1 kg)    General: Well Developed, well nourished, and in no acute distress.   MSK: Left knee obese body habitus.  Decreased motion with crepitation.  Antalgic gait.  Lab and Radiology Results  X-ray images left knee obtained anyway 24th 2022 showing severe medial compartment and patellofemoral  DJD.  X-rays personally independently interpreted today.   Impression and Recommendations:    Assessment and Plan: 55 y.o. female with left knee pain due to DJD.  Patient is failing conservative management.  She is already had steroid injections and hyaluronic acid injections with little benefit.  Ultimately she will require total knee replacement but her BMI at 56 exceeds limits.  BMI will need to be under 40 for knee replacement.  Her body weight will need to be around 220 pounds.  To have a knee replacement which is about a 30% reduction.  Not optimistic about dietary or lifestyle change be insufficient to cause a 30% reduction in body weight alone.  We discussed that gastric sleeve typically causes 30% body weight reduction and is a good option for her.  She agrees for bariatric surgery referral to discuss this further.  Additionally quadricep strengthening can be helpful for her knee pain.  She will have difficulty with conventional physical therapy but is a good candidate for aquatic physical therapy.  Referral placed for aquatic physical therapy today.  Additionally discussed options including geniculate nerve ablation.  She would like to consider it and read about a little bit before referral to pain management is placed for this.  Recheck back 6 to 8 weeks.  PDMP not reviewed this encounter. Orders Placed This Encounter  Procedures  . Ambulatory referral to Physical Therapy    Referral Priority:   Routine    Referral Type:   Physical Medicine    Referral Reason:   Specialty Services Required    Requested Specialty:   Physical Therapy    Number of Visits Requested:   1  . Ambulatory referral to General Surgery    Referral Priority:   Routine    Referral Type:   Surgical    Referral Reason:   Specialty Services Required    Requested Specialty:   General Surgery    Number of Visits Requested:   1   No orders of the defined types were placed in this encounter.   Discussed  warning signs or symptoms. Please see discharge instructions. Patient expresses understanding.   The above documentation has been reviewed and is accurate and complete Lynne Leader, M.D.  Total encounter time 30 minutes including face-to-face time with the patient and, reviewing past medical record, and charting on the date of service.   Treatment plan and options

## 2020-07-01 NOTE — Patient Instructions (Addendum)
Thank you for coming in today.  Plan for aquatic PT.   Plan also for discussion with Bariatric surgery.  Your weight loss target for knee replacement is BMI <40 which is about 220 pounds.    Consider Geniculate Nerve Ablation (Cool leaf procedure).  Keep me updated.   Recheck in 6-8 weeks.   Straight leg raises can help.

## 2020-07-02 ENCOUNTER — Encounter: Payer: Self-pay | Admitting: Family Medicine

## 2020-07-02 DIAGNOSIS — M25562 Pain in left knee: Secondary | ICD-10-CM

## 2020-07-02 DIAGNOSIS — M1712 Unilateral primary osteoarthritis, left knee: Secondary | ICD-10-CM

## 2020-07-02 DIAGNOSIS — G8929 Other chronic pain: Secondary | ICD-10-CM

## 2020-07-02 DIAGNOSIS — F331 Major depressive disorder, recurrent, moderate: Secondary | ICD-10-CM | POA: Diagnosis not present

## 2020-07-09 DIAGNOSIS — F331 Major depressive disorder, recurrent, moderate: Secondary | ICD-10-CM | POA: Diagnosis not present

## 2020-07-16 DIAGNOSIS — F331 Major depressive disorder, recurrent, moderate: Secondary | ICD-10-CM | POA: Diagnosis not present

## 2020-07-30 DIAGNOSIS — F331 Major depressive disorder, recurrent, moderate: Secondary | ICD-10-CM | POA: Diagnosis not present

## 2020-08-06 DIAGNOSIS — F331 Major depressive disorder, recurrent, moderate: Secondary | ICD-10-CM | POA: Diagnosis not present

## 2020-08-13 DIAGNOSIS — F331 Major depressive disorder, recurrent, moderate: Secondary | ICD-10-CM | POA: Diagnosis not present

## 2020-08-14 ENCOUNTER — Ambulatory Visit: Payer: BC Managed Care – PPO | Admitting: Family Medicine

## 2020-08-19 DIAGNOSIS — L821 Other seborrheic keratosis: Secondary | ICD-10-CM | POA: Diagnosis not present

## 2020-08-19 DIAGNOSIS — L814 Other melanin hyperpigmentation: Secondary | ICD-10-CM | POA: Diagnosis not present

## 2020-08-19 DIAGNOSIS — L905 Scar conditions and fibrosis of skin: Secondary | ICD-10-CM | POA: Diagnosis not present

## 2020-08-19 DIAGNOSIS — D225 Melanocytic nevi of trunk: Secondary | ICD-10-CM | POA: Diagnosis not present

## 2020-09-03 DIAGNOSIS — F331 Major depressive disorder, recurrent, moderate: Secondary | ICD-10-CM | POA: Diagnosis not present

## 2020-09-10 DIAGNOSIS — F331 Major depressive disorder, recurrent, moderate: Secondary | ICD-10-CM | POA: Diagnosis not present

## 2020-09-17 DIAGNOSIS — F331 Major depressive disorder, recurrent, moderate: Secondary | ICD-10-CM | POA: Diagnosis not present

## 2020-10-08 DIAGNOSIS — F331 Major depressive disorder, recurrent, moderate: Secondary | ICD-10-CM | POA: Diagnosis not present

## 2020-10-09 ENCOUNTER — Other Ambulatory Visit: Payer: Self-pay

## 2020-10-09 ENCOUNTER — Telehealth: Payer: Self-pay

## 2020-10-09 ENCOUNTER — Ambulatory Visit: Payer: BC Managed Care – PPO | Admitting: Orthopedic Surgery

## 2020-10-09 ENCOUNTER — Encounter: Payer: Self-pay | Admitting: Orthopedic Surgery

## 2020-10-09 DIAGNOSIS — M1712 Unilateral primary osteoarthritis, left knee: Secondary | ICD-10-CM | POA: Diagnosis not present

## 2020-10-09 DIAGNOSIS — M170 Bilateral primary osteoarthritis of knee: Secondary | ICD-10-CM

## 2020-10-09 MED ORDER — LIDOCAINE HCL 1 % IJ SOLN
5.0000 mL | INTRAMUSCULAR | Status: AC | PRN
  Administered 2020-10-09: 5 mL

## 2020-10-09 MED ORDER — METHYLPREDNISOLONE ACETATE 40 MG/ML IJ SUSP
40.0000 mg | INTRAMUSCULAR | Status: AC | PRN
  Administered 2020-10-09: 40 mg via INTRA_ARTICULAR

## 2020-10-09 MED ORDER — BUPIVACAINE HCL 0.25 % IJ SOLN
4.0000 mL | INTRAMUSCULAR | Status: AC | PRN
  Administered 2020-10-09: 4 mL via INTRA_ARTICULAR

## 2020-10-09 NOTE — Telephone Encounter (Signed)
Noted  

## 2020-10-09 NOTE — Telephone Encounter (Signed)
Can we get patient approved for left knee gel injection-same one that she had in January.

## 2020-10-09 NOTE — Progress Notes (Signed)
Office Visit Note   Patient: Dominique Mcpherson           Date of Birth: 18-Jan-1966           MRN: [id number] Visit Date: 10/09/2020 Requested by: Leeroy Cha, MD [address] [place],  [place] [address] PCP: Leeroy Cha, MD  Subjective: Chief Complaint  Patient presents with   Other     Knee pain-wants cortisone injection    HPI: [name] is a 55 year old patient with bilateral knee arthritis.  She is had good results with Synvisc/gel injection performed in January.  Having some recurrent pain now but no interval injury.  Taking over-the-counter medications as allowable.              ROS: All systems reviewed are negative as they relate to the chief complaint within the history of present illness.  Patient denies  fevers or chills.   Assessment & Plan: Visit Diagnoses:  1. Primary osteoarthritis of knees, bilateral     Plan: Impression is bilateral knee arthritis left worse than right.  Left knee cortisone injection performed today per her request.  Plan for gel injection later this year once this cortisone shot wears off.  Follow-up with [place] as needed.  Follow-Up Instructions: Return if symptoms worsen or fail to improve.   Orders:  No orders of the defined types were placed in this encounter.  No orders of the defined types were placed in this encounter.     Procedures: Large Joint Inj: L knee on 10/09/2020 10:09 AM Indications: diagnostic evaluation, joint swelling and pain Details: 18 G 1.5 in needle, ultrasound-guided superolateral approach  Arthrogram: No  Medications: 5 mL lidocaine 1 %; 40 mg methylPREDNISolone acetate 40 MG/ML; 4 mL bupivacaine 0.25 % Outcome: tolerated well, no immediate complications Procedure, treatment alternatives, risks and benefits explained, specific risks discussed. Consent was given by the patient. Immediately prior to procedure a time out was called to verify the correct patient, procedure, equipment,  support staff and site/side marked as required. Patient was prepped and draped in the usual sterile fashion.      Clinical Data: No additional findings.  Objective: Vital Signs: LMP 07/13/2017   Physical Exam:   Constitutional: Patient appears well-developed HEENT:  Head: Normocephalic Eyes:EOM are normal Neck: Normal range of motion Cardiovascular: Normal rate Pulmonary/chest: Effort normal Neurologic: Patient is alert Skin: Skin is warm Psychiatric: Patient has normal mood and affect   Ortho Exam: Ortho exam demonstrates intact extensor mechanism.  Range of motion is about 0-90.  Collaterals are stable.  No effusion is present in the knee.  Pedal pulses are palpable.  Not too much venous stasis present in the left lower extremity.  Specialty Comments:  No specialty comments available.  Imaging: No results found.   PMFS History: Patient Active Problem List   Diagnosis Date Noted   OA (osteoarthritis) of knee 07/01/2020   Knee pain, left 07/01/2020   Left elbow pain 06/11/2016   Achilles tendon pain 06/11/2016   Melanoma of upper arm ([place]) 05/09/2013   Morbid obesity ([place]) 11/29/2012   Past Medical History:  Diagnosis Date   Acid reflux    Anxiety    Depression    Hypothyroidism    Knee osteoarthritis    with degenerative medial meniscus tear   Meniscus tear    Osteoarthritis     Family History  Problem Relation Age of Onset   Hypertension Brother    Cancer Paternal Grandmother  colon   Liver cancer Paternal Grandmother    Breast cancer Neg Hx     Past Surgical History:  Procedure Laterality Date   CHOLECYSTECTOMY     FOOT SURGERY     bilateral plantar fascitis   HYSTEROSCOPY WITH D & C N/A 01/31/2013   Procedure: DILATATION AND CURETTAGE  TRU CLEAR /HYSTEROSCOPY;  Surgeon: Azalia Bilis, MD;  Location: [facility];  Service: Gynecology;  Laterality: N/A;   MELANOMA EXCISION Right 05/17/2013   Procedure: WIDE EXCISION MELANOMA RIGHT UPPER ARM;   Surgeon: Harl Bowie, MD;  Location: [facility];  Service: General;  Laterality: Right;   TONSILLECTOMY     UPPER GI ENDOSCOPY  3/15   Social History   Occupational History   Not on file  Tobacco Use   Smoking status: Never   Smokeless tobacco: Never  Vaping Use   Vaping Use: Never used  Substance and Sexual Activity   Alcohol use: Yes    Alcohol/week: 2.0 standard drinks    Types: 2 Standard drinks or equivalent per week    Comment: occ   Drug use: No   Sexual activity: Never    Partners: Male    Birth control/protection: Abstinence

## 2020-10-14 NOTE — Telephone Encounter (Signed)
Next available gel injection would be after 11/23/2020 due to patient receiving previous gel injection on 05/26/2020.

## 2020-10-22 DIAGNOSIS — F331 Major depressive disorder, recurrent, moderate: Secondary | ICD-10-CM | POA: Diagnosis not present

## 2020-10-24 ENCOUNTER — Ambulatory Visit: Payer: BC Managed Care – PPO | Admitting: Podiatry

## 2020-10-24 ENCOUNTER — Encounter: Payer: Self-pay | Admitting: Podiatry

## 2020-10-24 ENCOUNTER — Other Ambulatory Visit: Payer: Self-pay

## 2020-10-24 DIAGNOSIS — M1610 Unilateral primary osteoarthritis, unspecified hip: Secondary | ICD-10-CM | POA: Insufficient documentation

## 2020-10-24 DIAGNOSIS — K219 Gastro-esophageal reflux disease without esophagitis: Secondary | ICD-10-CM | POA: Insufficient documentation

## 2020-10-24 DIAGNOSIS — G5710 Meralgia paresthetica, unspecified lower limb: Secondary | ICD-10-CM | POA: Insufficient documentation

## 2020-10-24 DIAGNOSIS — E039 Hypothyroidism, unspecified: Secondary | ICD-10-CM | POA: Insufficient documentation

## 2020-10-24 DIAGNOSIS — F321 Major depressive disorder, single episode, moderate: Secondary | ICD-10-CM | POA: Insufficient documentation

## 2020-10-24 DIAGNOSIS — M25551 Pain in right hip: Secondary | ICD-10-CM | POA: Insufficient documentation

## 2020-10-24 DIAGNOSIS — L600 Ingrowing nail: Secondary | ICD-10-CM

## 2020-10-24 DIAGNOSIS — Z800 Family history of malignant neoplasm of digestive organs: Secondary | ICD-10-CM | POA: Insufficient documentation

## 2020-10-24 DIAGNOSIS — E559 Vitamin D deficiency, unspecified: Secondary | ICD-10-CM | POA: Insufficient documentation

## 2020-10-24 DIAGNOSIS — M898X1 Other specified disorders of bone, shoulder: Secondary | ICD-10-CM | POA: Insufficient documentation

## 2020-10-24 DIAGNOSIS — R413 Other amnesia: Secondary | ICD-10-CM | POA: Insufficient documentation

## 2020-10-24 DIAGNOSIS — M19049 Primary osteoarthritis, unspecified hand: Secondary | ICD-10-CM | POA: Insufficient documentation

## 2020-10-24 DIAGNOSIS — G8929 Other chronic pain: Secondary | ICD-10-CM | POA: Insufficient documentation

## 2020-10-24 DIAGNOSIS — J309 Allergic rhinitis, unspecified: Secondary | ICD-10-CM | POA: Insufficient documentation

## 2020-10-24 NOTE — Patient Instructions (Signed)

## 2020-10-26 NOTE — Progress Notes (Signed)
Subjective:   Patient ID: Dominique Mcpherson, female   DOB: 55 y.o.   MRN: [id number]   HPI Patient presents with chronic ingrown toenail of the right big toe with thick nailbed that she cannot cut.  She said several others removed in the past and also has obesity which is complicating factor.  Patient has good digital perfusion well oriented x3 does not smoke and is   Review of Systems  All other systems reviewed and are negative.      Objective:  Physical Exam Vitals and nursing note reviewed.  Constitutional:      Appearance: She is well-developed.  Pulmonary:     Effort: Pulmonary effort is normal.  Musculoskeletal:        General: Normal range of motion.  Skin:    General: Skin is warm.  Neurological:     Mental Status: She is alert.    Neurovascular status intact muscle strength adequate range of motion adequate.  Patient is found to have a very dystrophic thickened right hallux nail that is painful when pressed dorsally and on the sides and has several other nails that have been removed in the past that are doing well with patient also having moderate digital deformity     Assessment:  Chronic damaged hallux nail right painful with several nails of the been removed in the past that have done well     Plan:  H&P reviewed condition recommended nail removal explained procedure risk and patient signed consent form.  I infiltrated the right hallux 60 mg like Marcaine mixture sterile prep done and using sterile instrumentation removed the hallux nail exposed the matrix applied phenol 3 applications 30 seconds followed by alcohol by sterile dressing gave instructions on soaks and encouraged her to leave dressing on 24 hours but take it off earlier if any throbbing were to occur

## 2020-10-31 ENCOUNTER — Other Ambulatory Visit: Payer: Self-pay | Admitting: Internal Medicine

## 2020-10-31 DIAGNOSIS — Z1231 Encounter for screening mammogram for malignant neoplasm of breast: Secondary | ICD-10-CM

## 2020-11-04 DIAGNOSIS — H52223 Regular astigmatism, bilateral: Secondary | ICD-10-CM | POA: Diagnosis not present

## 2020-11-04 DIAGNOSIS — H53143 Visual discomfort, bilateral: Secondary | ICD-10-CM | POA: Diagnosis not present

## 2020-11-05 ENCOUNTER — Encounter: Payer: Self-pay | Admitting: Podiatry

## 2020-11-05 ENCOUNTER — Other Ambulatory Visit: Payer: Self-pay | Admitting: Podiatry

## 2020-11-05 MED ORDER — CEPHALEXIN 500 MG PO CAPS: 500.0000 mg | ORAL_CAPSULE | Freq: Three times a day (TID) | ORAL | 0 refills | Status: DC

## 2020-11-07 ENCOUNTER — Telehealth: Payer: Self-pay

## 2020-11-07 NOTE — Telephone Encounter (Signed)
VOB submitted for SynviscOne, left knee. Pending BV. 

## 2020-11-10 ENCOUNTER — Telehealth: Payer: Self-pay

## 2020-11-10 NOTE — Telephone Encounter (Signed)
PA required for SynviscOne, left knee. Submitted PA online through Coverm[employer]. Pending PA# [id number]

## 2020-11-12 DIAGNOSIS — F331 Major depressive disorder, recurrent, moderate: Secondary | ICD-10-CM | POA: Diagnosis not present

## 2020-11-14 ENCOUNTER — Telehealth: Payer: Self-pay

## 2020-11-14 NOTE — Telephone Encounter (Signed)
PA will not be approved until patient has used fewer pain medications or a lower dose of pain medications since the last injection, and has significant improvement in the activities of daily living since previous injection. Next available injection will need to be after 11/23/2020.  Please advise.  Thank you.

## 2020-11-16 NOTE — Telephone Encounter (Signed)
Pls inform patient thx

## 2020-11-18 NOTE — Telephone Encounter (Signed)
Talked with pateint and advised her of denial per [employer].  Advised patient when she has an updated visit, the office note will be faxed to [facility] for reconsideration.

## 2020-11-19 DIAGNOSIS — F331 Major depressive disorder, recurrent, moderate: Secondary | ICD-10-CM | POA: Diagnosis not present

## 2020-11-26 DIAGNOSIS — F331 Major depressive disorder, recurrent, moderate: Secondary | ICD-10-CM | POA: Diagnosis not present

## 2020-12-03 DIAGNOSIS — F331 Major depressive disorder, recurrent, moderate: Secondary | ICD-10-CM | POA: Diagnosis not present

## 2020-12-10 DIAGNOSIS — F902 Attention-deficit hyperactivity disorder, combined type: Secondary | ICD-10-CM | POA: Diagnosis not present

## 2020-12-10 DIAGNOSIS — Z79899 Other long term (current) drug therapy: Secondary | ICD-10-CM | POA: Diagnosis not present

## 2020-12-10 DIAGNOSIS — F331 Major depressive disorder, recurrent, moderate: Secondary | ICD-10-CM | POA: Diagnosis not present

## 2020-12-25 ENCOUNTER — Ambulatory Visit: Payer: BC Managed Care – PPO

## 2020-12-31 DIAGNOSIS — F331 Major depressive disorder, recurrent, moderate: Secondary | ICD-10-CM | POA: Diagnosis not present

## 2021-01-02 DIAGNOSIS — E039 Hypothyroidism, unspecified: Secondary | ICD-10-CM | POA: Diagnosis not present

## 2021-01-02 DIAGNOSIS — Z23 Encounter for immunization: Secondary | ICD-10-CM | POA: Diagnosis not present

## 2021-01-02 DIAGNOSIS — Z0000 Encounter for general adult medical examination without abnormal findings: Secondary | ICD-10-CM | POA: Diagnosis not present

## 2021-01-07 DIAGNOSIS — F331 Major depressive disorder, recurrent, moderate: Secondary | ICD-10-CM | POA: Diagnosis not present

## 2021-01-21 DIAGNOSIS — F331 Major depressive disorder, recurrent, moderate: Secondary | ICD-10-CM | POA: Diagnosis not present

## 2021-02-11 DIAGNOSIS — F331 Major depressive disorder, recurrent, moderate: Secondary | ICD-10-CM | POA: Diagnosis not present

## 2021-02-18 DIAGNOSIS — F331 Major depressive disorder, recurrent, moderate: Secondary | ICD-10-CM | POA: Diagnosis not present

## 2021-02-24 DIAGNOSIS — Z20822 Contact with and (suspected) exposure to covid-19: Secondary | ICD-10-CM | POA: Diagnosis not present

## 2021-02-26 DIAGNOSIS — Z20822 Contact with and (suspected) exposure to covid-19: Secondary | ICD-10-CM | POA: Diagnosis not present

## 2021-03-04 DIAGNOSIS — F331 Major depressive disorder, recurrent, moderate: Secondary | ICD-10-CM | POA: Diagnosis not present

## 2021-03-11 DIAGNOSIS — F331 Major depressive disorder, recurrent, moderate: Secondary | ICD-10-CM | POA: Diagnosis not present

## 2021-03-16 ENCOUNTER — Ambulatory Visit (HOSPITAL_BASED_OUTPATIENT_CLINIC_OR_DEPARTMENT_OTHER): Payer: BC Managed Care – PPO | Admitting: Obstetrics & Gynecology

## 2021-03-18 DIAGNOSIS — F331 Major depressive disorder, recurrent, moderate: Secondary | ICD-10-CM | POA: Diagnosis not present

## 2021-04-01 DIAGNOSIS — F331 Major depressive disorder, recurrent, moderate: Secondary | ICD-10-CM | POA: Diagnosis not present

## 2021-04-08 DIAGNOSIS — F331 Major depressive disorder, recurrent, moderate: Secondary | ICD-10-CM | POA: Diagnosis not present

## 2021-04-15 DIAGNOSIS — F331 Major depressive disorder, recurrent, moderate: Secondary | ICD-10-CM | POA: Diagnosis not present

## 2021-04-22 DIAGNOSIS — U071 COVID-19: Secondary | ICD-10-CM | POA: Diagnosis not present

## 2021-04-22 DIAGNOSIS — F331 Major depressive disorder, recurrent, moderate: Secondary | ICD-10-CM | POA: Diagnosis not present

## 2021-04-23 DIAGNOSIS — U071 COVID-19: Secondary | ICD-10-CM | POA: Diagnosis not present

## 2021-04-24 DIAGNOSIS — R059 Cough, unspecified: Secondary | ICD-10-CM | POA: Diagnosis not present

## 2021-04-24 DIAGNOSIS — U071 COVID-19: Secondary | ICD-10-CM | POA: Diagnosis not present

## 2021-04-24 DIAGNOSIS — R0981 Nasal congestion: Secondary | ICD-10-CM | POA: Diagnosis not present

## 2021-04-27 DIAGNOSIS — U071 COVID-19: Secondary | ICD-10-CM | POA: Diagnosis not present

## 2021-04-27 DIAGNOSIS — Z20822 Contact with and (suspected) exposure to covid-19: Secondary | ICD-10-CM | POA: Diagnosis not present

## 2021-05-06 DIAGNOSIS — F331 Major depressive disorder, recurrent, moderate: Secondary | ICD-10-CM | POA: Diagnosis not present

## 2021-05-18 ENCOUNTER — Other Ambulatory Visit: Payer: Self-pay

## 2021-05-18 ENCOUNTER — Encounter (HOSPITAL_BASED_OUTPATIENT_CLINIC_OR_DEPARTMENT_OTHER): Payer: Self-pay | Admitting: Obstetrics & Gynecology

## 2021-05-18 ENCOUNTER — Ambulatory Visit (INDEPENDENT_AMBULATORY_CARE_PROVIDER_SITE_OTHER): Payer: BC Managed Care – PPO | Admitting: Obstetrics & Gynecology

## 2021-05-18 VITALS — BP 136/80 | HR 76 | Ht 62.0 in | Wt 311.6 lb

## 2021-05-18 DIAGNOSIS — Z23 Encounter for immunization: Secondary | ICD-10-CM | POA: Diagnosis not present

## 2021-05-18 DIAGNOSIS — Z800 Family history of malignant neoplasm of digestive organs: Secondary | ICD-10-CM | POA: Diagnosis not present

## 2021-05-18 DIAGNOSIS — Z780 Asymptomatic menopausal state: Secondary | ICD-10-CM | POA: Diagnosis not present

## 2021-05-18 DIAGNOSIS — Z01419 Encounter for gynecological examination (general) (routine) without abnormal findings: Secondary | ICD-10-CM | POA: Diagnosis not present

## 2021-05-18 NOTE — Progress Notes (Signed)
56 y.o. G0P0000 Single White or Caucasian female here for annual exam.  Denies vaginal bleeding.  She has been referred for colonoscopy.  Needs tdap today.  Patient's last menstrual period was 07/13/2017.          Sexually active: No.  The current method of family planning is post menopausal status.    Exercising: no Smoker:  no  Health Maintenance: Pap:  05/01/19 neg with neg HR HP History of abnormal Pap:  no MMG:  09/03/19 neg Colonoscopy:  Referral has been made to [facility] GI BMD:   not indicated Screening Labs: September 2022 was ordered but could not have this done due to difficulty with obtaining blood work.  Will plan this year.   reports that she has never smoked. She has never used smokeless tobacco. She reports current alcohol use of about 2.0 standard drinks per week. She reports that she does not use drugs.  Past Medical History:  Diagnosis Date   Acid reflux    Anxiety    Depression    Hypothyroidism    Knee osteoarthritis    with degenerative medial meniscus tear   Meniscus tear    Osteoarthritis     Past Surgical History:  Procedure Laterality Date   CHOLECYSTECTOMY     FOOT SURGERY     bilateral plantar fascitis   HYSTEROSCOPY WITH D & C N/A 01/31/2013   Procedure: DILATATION AND CURETTAGE  TRU CLEAR /HYSTEROSCOPY;  Surgeon: Azalia Bilis, MD;  Location: [facility];  Service: Gynecology;  Laterality: N/A;   MELANOMA EXCISION Right 05/17/2013   Procedure: WIDE EXCISION MELANOMA RIGHT UPPER ARM;  Surgeon: Harl Bowie, MD;  Location: [facility];  Service: General;  Laterality: Right;   TONSILLECTOMY     UPPER GI ENDOSCOPY  3/15    Current Outpatient Medications  Medication Sig Dispense Refill   acetaminophen (TYLENOL) 500 MG tablet Take 1,000 mg by mouth as needed.      cetirizine (ZYRTEC) 10 MG tablet Take 10 mg by mouth as needed.     fluticasone (FLONASE) 50 MCG/ACT nasal spray 1 spray in each nostril     glucosamine-chondroitin 500-400  MG tablet Take 1 tablet by mouth daily.     guanFACINE (INTUNIV) 4 MG TB24 ER tablet Take 4 mg by mouth daily.     levothyroxine (SYNTHROID) 75 MCG tablet 1 tablet     naproxen sodium (ALEVE) 220 MG tablet 1 tablet     omeprazole (PRILOSEC) 20 MG capsule 1 capsule     phentermine 30 MG capsule Take 30 mg by mouth every morning.     No current facility-administered medications for this visit.    Family History  Problem Relation Age of Onset   Cancer Paternal Grandmother        colon   Liver cancer Paternal Grandmother    Heart disease Maternal Grandmother    Stroke Mother    Hypertension Mother    Hypertension Brother    Breast cancer Neg Hx     Review of Systems  All other systems reviewed and are negative.  Exam:   BP 136/80    Pulse 76    Ht 5\' 2"  (1.575 m)    Wt (!) 311 lb 9.6 oz (141.3 kg)    LMP 07/13/2017    BMI 56.99 kg/m   Height: 5\' 2"  (157.5 cm)  General appearance: alert, cooperative and appears stated age Head: Normocephalic, without obvious abnormality, atraumatic Neck: no adenopathy, supple, symmetrical,  trachea midline and thyroid normal to inspection and palpation Lungs: clear to auscultation bilaterally Breasts: normal appearance, no masses or tenderness Heart: regular rate and rhythm Abdomen: soft, non-tender; bowel sounds normal; no masses,  no organomegaly Extremities: extremities normal, atraumatic, no cyanosis or edema Skin: Skin color, texture, turgor normal. No rashes or lesions Lymph nodes: Cervical, supraclavicular, and axillary nodes normal. No abnormal inguinal nodes palpated Neurologic: Grossly normal  Pelvic: External genitalia:  no lesions              Urethra:  normal appearing urethra with no masses, tenderness or lesions              Bartholins and Skenes: normal                 Vagina: normal appearing vagina with normal color and no discharge, no lesions              Cervix: no lesions              Pap taken: No. Bimanual Exam:   Uterus:  normal size, contour, position, consistency, mobility, non-tender              Adnexa: normal adnexa and no mass, fullness, tenderness               Rectovaginal: Confirms               Anus:  normal sphincter tone, no lesions  Chaperone, Octaviano Batty, CMA, was present for exam.  Assessment/Plan: 1. Well woman exam with routine gynecological exam - plan pap smear next year - mammogram is overdue - lab work  is due with PCP - pt aware colonoscopy is due.  Referral has been placed. - tdap will be updated today  2. Postmenopausal - no HRT  3. Family history of colon cancer

## 2021-06-03 DIAGNOSIS — F331 Major depressive disorder, recurrent, moderate: Secondary | ICD-10-CM | POA: Diagnosis not present

## 2021-06-10 DIAGNOSIS — F902 Attention-deficit hyperactivity disorder, combined type: Secondary | ICD-10-CM | POA: Diagnosis not present

## 2021-06-10 DIAGNOSIS — F331 Major depressive disorder, recurrent, moderate: Secondary | ICD-10-CM | POA: Diagnosis not present

## 2021-06-10 DIAGNOSIS — Z79899 Other long term (current) drug therapy: Secondary | ICD-10-CM | POA: Diagnosis not present

## 2021-06-17 DIAGNOSIS — F331 Major depressive disorder, recurrent, moderate: Secondary | ICD-10-CM | POA: Diagnosis not present

## 2021-07-16 DIAGNOSIS — F331 Major depressive disorder, recurrent, moderate: Secondary | ICD-10-CM | POA: Diagnosis not present

## 2021-07-24 DIAGNOSIS — Z13220 Encounter for screening for lipoid disorders: Secondary | ICD-10-CM | POA: Diagnosis not present

## 2021-07-24 DIAGNOSIS — Z79899 Other long term (current) drug therapy: Secondary | ICD-10-CM | POA: Diagnosis not present

## 2021-07-24 DIAGNOSIS — E039 Hypothyroidism, unspecified: Secondary | ICD-10-CM | POA: Diagnosis not present

## 2021-07-24 DIAGNOSIS — F3341 Major depressive disorder, recurrent, in partial remission: Secondary | ICD-10-CM | POA: Diagnosis not present

## 2021-07-29 DIAGNOSIS — F331 Major depressive disorder, recurrent, moderate: Secondary | ICD-10-CM | POA: Diagnosis not present

## 2021-08-04 DIAGNOSIS — R6883 Chills (without fever): Secondary | ICD-10-CM | POA: Diagnosis not present

## 2021-08-04 DIAGNOSIS — F331 Major depressive disorder, recurrent, moderate: Secondary | ICD-10-CM | POA: Diagnosis not present

## 2021-08-04 DIAGNOSIS — R0981 Nasal congestion: Secondary | ICD-10-CM | POA: Diagnosis not present

## 2021-08-04 DIAGNOSIS — U071 COVID-19: Secondary | ICD-10-CM | POA: Diagnosis not present

## 2021-08-04 DIAGNOSIS — R52 Pain, unspecified: Secondary | ICD-10-CM | POA: Diagnosis not present

## 2021-08-12 DIAGNOSIS — F331 Major depressive disorder, recurrent, moderate: Secondary | ICD-10-CM | POA: Diagnosis not present

## 2021-08-26 DIAGNOSIS — Z20822 Contact with and (suspected) exposure to covid-19: Secondary | ICD-10-CM | POA: Diagnosis not present

## 2021-08-26 DIAGNOSIS — J029 Acute pharyngitis, unspecified: Secondary | ICD-10-CM | POA: Diagnosis not present

## 2021-09-09 DIAGNOSIS — Z79899 Other long term (current) drug therapy: Secondary | ICD-10-CM | POA: Diagnosis not present

## 2021-09-09 DIAGNOSIS — F902 Attention-deficit hyperactivity disorder, combined type: Secondary | ICD-10-CM | POA: Diagnosis not present

## 2021-09-10 ENCOUNTER — Ambulatory Visit: Payer: BC Managed Care – PPO | Admitting: Podiatry

## 2021-09-14 ENCOUNTER — Encounter (HOSPITAL_COMMUNITY): Payer: Self-pay

## 2021-09-14 ENCOUNTER — Ambulatory Visit: Payer: Self-pay

## 2021-09-14 ENCOUNTER — Ambulatory Visit (HOSPITAL_COMMUNITY)
Admission: RE | Admit: 2021-09-14 | Discharge: 2021-09-14 | Disposition: A | Discharge status: Home / Self Care | Payer: BC Managed Care – PPO | Source: Ambulatory Visit | Attending: Internal Medicine | Admitting: Internal Medicine

## 2021-09-14 ENCOUNTER — Ambulatory Visit: Payer: BC Managed Care – PPO | Admitting: Orthopedic Surgery

## 2021-09-14 VITALS — BP 147/82 | HR 79 | Temp 98.1°F | Resp 18

## 2021-09-14 DIAGNOSIS — J029 Acute pharyngitis, unspecified: Secondary | ICD-10-CM | POA: Insufficient documentation

## 2021-09-14 DIAGNOSIS — J069 Acute upper respiratory infection, unspecified: Secondary | ICD-10-CM | POA: Diagnosis not present

## 2021-09-14 LAB — POCT RAPID STREP A, ED / UC: Streptococcus, Group A Screen (Direct): NEGATIVE

## 2021-09-14 MED ORDER — BENZONATATE 100 MG PO CAPS: 100.0000 mg | ORAL_CAPSULE | Freq: Three times a day (TID) | ORAL | 0 refills | Status: DC | PRN

## 2021-09-14 MED ORDER — FLUTICASONE PROPIONATE 50 MCG/ACT NA SUSP: 1.0000 | Freq: Every day | NASAL | 0 refills | Status: DC

## 2021-09-14 MED ORDER — CHLORASEPTIC 1.4 % MT LIQD: 1.0000 | OROMUCOSAL | 0 refills | Status: DC | PRN

## 2021-09-14 NOTE — ED Triage Notes (Signed)
Pt c/o sore throat x5 days. Taking OTC with no relief. States had a neg home covid test. Pt requesting strep test.  ?

## 2021-09-14 NOTE — Discharge Instructions (Signed)
It appears that you have a viral upper respiratory infection that should run its course and self resolve in the next few days with symptomatic treatment.  You have been prescribed 3 medications to alleviate symptoms.  Please follow-up if symptoms persist or worsen. ?

## 2021-09-14 NOTE — ED Provider Notes (Signed)
?[facility] ? ? ? ?CSN: [id number] ?Arrival date & time: 09/14/21  7408 ? ? ?  ? ?History   ?Chief Complaint ?Chief Complaint  ?Patient presents with  ? Sore Throat  ?  sore throat for 5 days - covid negative - requesting strep test - Entered by patient  ? ? ?HPI ?Dominique Mcpherson is a 56 y.o. female.  ? ?Patient presents with sore throat that has been present for approximately 5 days.  Also has associated mild nasal congestion and mild nonproductive cough.  Patient has taken several over-the-counter cold and flu medications with no improvement of symptoms.  Her daughter has similar symptoms as well.  Denies chest pain, shortness of breath, ear pain, nausea, vomiting, diarrhea, abdominal pain.  Patient has taken a COVID test at home that was negative.  Patient is requesting strep test. ? ? ?Sore Throat ? ? ?Past Medical History:  ?Diagnosis Date  ? Acid reflux   ? Anxiety   ? Depression   ? Hypothyroidism   ? Knee osteoarthritis   ? with degenerative medial meniscus tear  ? Meniscus tear   ? Osteoarthritis   ? ? ?Patient Active Problem List  ? Diagnosis Date Noted  ? Acquired hypothyroidism 10/24/2020  ? Allergic rhinitis 10/24/2020  ? Amnesia 10/24/2020  ? Arthritis of hip 10/24/2020  ? Chronic pain 10/24/2020  ? Family history of malignant neoplasm of gastrointestinal tract 10/24/2020  ? Gastro-esophageal reflux disease without esophagitis 10/24/2020  ? Localized, primary osteoarthritis of hand 10/24/2020  ? Meralgia paresthetica 10/24/2020  ? Moderate major depression ([place]) 10/24/2020  ? Scapulalgia 10/24/2020  ? Pain in right hip 10/24/2020  ? Vitamin D deficiency 10/24/2020  ? OA (osteoarthritis) of knee 07/01/2020  ? Knee pain, left 07/01/2020  ? Left elbow pain 06/11/2016  ? Achilles tendon pain 06/11/2016  ? Melanoma of upper arm ([place]) 05/09/2013  ? Morbid obesity ([place]) 11/29/2012  ? ? ?Past Surgical History:  ?Procedure Laterality Date  ? CHOLECYSTECTOMY    ? FOOT SURGERY    ? bilateral plantar  fascitis  ? HYSTEROSCOPY WITH D & C N/A 01/31/2013  ? Procedure: DILATATION AND CURETTAGE  TRU CLEAR /HYSTEROSCOPY;  Surgeon: Azalia Bilis, MD;  Location: [facility];  Service: Gynecology;  Laterality: N/A;  ? MELANOMA EXCISION Right 05/17/2013  ? Procedure: WIDE EXCISION MELANOMA RIGHT UPPER ARM;  Surgeon: Harl Bowie, MD;  Location: [facility];  Service: General;  Laterality: Right;  ? TONSILLECTOMY    ? UPPER GI ENDOSCOPY  3/15  ? ? ?OB History   ? ? Gravida  ?0  ? Para  ?0  ? Term  ?0  ? Preterm  ?0  ? AB  ?0  ? Living  ?0  ?  ? ? SAB  ?0  ? IAB  ?0  ? Ectopic  ?0  ? Multiple  ?0  ? Live Births  ?   ?   ?  ?  ? ? ? ?Home Medications   ? ?Prior to Admission medications   ?Medication Sig Start Date End Date Taking? Authorizing Provider  ?benzonatate (TESSALON) 100 MG capsule Take 1 capsule (100 mg total) by mouth every 8 (eight) hours as needed for cough. 09/14/21  Yes Teodora Medici, FNP  ?fluticasone (FLONASE) 50 MCG/ACT nasal spray Place 1 spray into both nostrils daily for 3 days. 09/14/21 09/17/21 Yes Teodora Medici, FNP  ?phenol (CHLORASEPTIC) 1.4 % LIQD Use as directed 1 spray in the  mouth or throat as needed for throat irritation / pain. 09/14/21  Yes Teodora Medici, FNP  ?acetaminophen (TYLENOL) 500 MG tablet Take 1,000 mg by mouth as needed.     [provider]  ?cetirizine (ZYRTEC) 10 MG tablet Take 10 mg by mouth as needed. 12/17/18   [provider]  ?fluticasone (FLONASE) 50 MCG/ACT nasal spray 1 spray in each nostril    [provider]  ?glucosamine-chondroitin 500-400 MG tablet Take 1 tablet by mouth daily.    [provider]  ?guanFACINE (INTUNIV) 4 MG TB24 ER tablet Take 4 mg by mouth daily. 10/06/20   [provider]  ?levothyroxine (SYNTHROID) 75 MCG tablet 1 tablet 04/03/20   [provider]  ?naproxen sodium (ALEVE) 220 MG tablet 1 tablet    [provider]  ?omeprazole (PRILOSEC) 20 MG capsule 1 capsule    [provider]  ?phentermine 30 MG capsule Take 30 mg by mouth every morning.    [provider]  ? ? ?Family History ?Family History  ?Problem Relation Age of Onset  ? Cancer Paternal Grandmother   ?     colon  ? Liver cancer Paternal Grandmother   ? Heart disease Maternal Grandmother   ? Stroke Mother   ? Hypertension Mother   ? Hypertension Brother   ? Breast cancer Neg Hx   ? ? ?Social History ?Social History  ? ?Tobacco Use  ? Smoking status: Never  ? Smokeless tobacco: Never  ?Vaping Use  ? Vaping Use: Never used  ?Substance Use Topics  ? Alcohol use: Yes  ?  Alcohol/week: 2.0 standard drinks  ?  Types: 2 Standard drinks or equivalent per week  ?  Comment: occ  ? Drug use: No  ? ? ? ?Allergies   ?Bee venom ? ? ?Review of Systems ?Review of Systems ?Per HPI ? ?Physical Exam ?Triage Vital Signs ?ED Triage Vitals [09/14/21 0857]  ?Enc Vitals Group  ?   BP (!) 147/82  ?   Pulse Rate 79  ?   Resp 18  ?   Temp 98.1 ?F (36.7 ?C)  ?   Temp Source Oral  ?   SpO2 98 %  ?   Weight   ?   Height   ?   Head Circumference   ?   Peak Flow   ?   Pain Score 7  ?   Pain Loc   ?   Pain Edu?   ?   Excl. in [place]?   ? ?No data found. ? ?Updated Vital Signs ?BP (!) 147/82 (BP Location: Left Arm)   Pulse 79   Temp 98.1 ?F (36.7 ?C) (Oral)   Resp 18   LMP 07/13/2017   SpO2 98%  ? ?Visual Acuity ?Right Eye Distance:   ?Left Eye Distance:   ?Bilateral Distance:   ? ?Right Eye Near:   ?Left Eye Near:    ?Bilateral Near:    ? ?Physical Exam ?Constitutional:   ?   General: She is not in acute distress. ?   Appearance: Normal appearance. She is not toxic-appearing or diaphoretic.  ?HENT:  ?   Head: Normocephalic and atraumatic.  ?   Right Ear: Tympanic membrane and ear canal normal.  ?   Left Ear: Tympanic membrane and ear canal normal.  ?   Nose: Congestion present.  ?   Mouth/Throat:  ?   Mouth: Mucous membranes are moist.  ?   Pharynx: Posterior oropharyngeal erythema  present.  ?Eyes:  ?   Extraocular Movements: Extraocular  movements intact.  ?   Conjunctiva/sclera: Conjunctivae normal.  ?   Pupils: Pupils are equal, round, and reactive to light.  ?Cardiovascular:  ?   Rate and Rhythm: Normal rate and regular rhythm.  ?   Pulses: Normal pulses.  ?   Heart sounds: Normal heart sounds.  ?Pulmonary:  ?   Effort: Pulmonary effort is normal. No respiratory distress.  ?   Breath sounds: Normal breath sounds. No stridor. No wheezing, rhonchi or rales.  ?Abdominal:  ?   General: Abdomen is flat. Bowel sounds are normal.  ?   Palpations: Abdomen is soft.  ?Musculoskeletal:     ?   General: Normal range of motion.  ?   Cervical back: Normal range of motion.  ?Skin: ?   General: Skin is warm and dry.  ?Neurological:  ?   General: No focal deficit present.  ?   Mental Status: She is alert and oriented to person, place, and time. Mental status is at baseline.  ?Psychiatric:     ?   Mood and Affect: Mood normal.     ?   Behavior: Behavior normal.  ? ? ? ?UC Treatments / Results  ?Labs ?(all labs ordered are listed, but only abnormal results are displayed) ?Labs Reviewed  ?CULTURE, GROUP A STREP [facility])  ?POCT RAPID STREP A, ED / UC  ? ? ?EKG ? ? ?Radiology ?No results found. ? ?Procedures ?Procedures (including critical care time) ? ?Medications Ordered in UC ?Medications - No data to display ? ?Initial Impression / Assessment and Plan / UC Course  ?I have reviewed the triage vital signs and the nursing notes. ? ?Pertinent labs & imaging results that were available during my care of the patient were reviewed by me and considered in my medical decision making (see chart for details). ? ?  ? ?Patient presents with symptoms likely from a viral upper respiratory infection. Differential includes bacterial pneumonia, sinusitis, allergic rhinitis, COVID-19, flu. Do not suspect underlying cardiopulmonary process. Symptoms seem unlikely related to ACS, CHF or COPD exacerbations, pneumonia, pneumothorax. Patient is nontoxic appearing and not in need of  emergent medical intervention.  Rapid strep was negative.  Throat culture pending.  Patient had negative COVID test at home.  Do not think that additional viral testing is necessary at this time and patient was agreeable.

## 2021-09-16 DIAGNOSIS — F331 Major depressive disorder, recurrent, moderate: Secondary | ICD-10-CM | POA: Diagnosis not present

## 2021-09-17 LAB — CULTURE, GROUP A STREP (THRC)

## 2021-09-18 ENCOUNTER — Other Ambulatory Visit: Payer: Self-pay | Admitting: Family Medicine

## 2021-09-18 ENCOUNTER — Encounter: Payer: Self-pay | Admitting: Podiatry

## 2021-09-18 ENCOUNTER — Ambulatory Visit: Payer: BC Managed Care – PPO | Admitting: Podiatry

## 2021-09-18 ENCOUNTER — Ambulatory Visit
Admission: RE | Admit: 2021-09-18 | Discharge: 2021-09-18 | Disposition: A | Discharge status: Home / Self Care | Payer: BC Managed Care – PPO | Source: Ambulatory Visit | Attending: Internal Medicine | Admitting: Internal Medicine

## 2021-09-18 DIAGNOSIS — L600 Ingrowing nail: Secondary | ICD-10-CM | POA: Diagnosis not present

## 2021-09-18 DIAGNOSIS — Z1231 Encounter for screening mammogram for malignant neoplasm of breast: Secondary | ICD-10-CM

## 2021-09-18 NOTE — Progress Notes (Signed)
Subjective:   Patient ID: Dominique Mcpherson, female   DOB: 56 y.o.   MRN: [id number]   HPI Patient states the second digit has become very painful in the nailbed and she does have problems with other nails but not to the same degree.  Nails removed and done well in the past    ROS      Objective:  Physical Exam  Moderate obesity making it difficult to take care of nails with thick incurvated second nail left and several other nails that have deformity     Assessment:  Reviewed condition discussed treatment options recommended removal of the second nail she cannot take care of it herself and I think it would be best to be done in a permanent fashion.  I did find that there is thickness of the nailbed and it is painful and the remaining nails are just incurvated not to the same degree     Plan:  Reviewed condition recommended nail removal explained procedure risk and patient wants surgery.  At this point she signed consent form I infiltrated the left second toe 60 mg Xylocaine Marcaine mixture and went ahead and using sterile instrumentation remove the second nail exposed matrix applied phenol 3 applications 30 seconds followed by alcohol sterile dressing and recommended continued soaks.  Patient will be seen back to recheck debridement of remaining nails accomplished

## 2021-09-18 NOTE — Patient Instructions (Signed)

## 2021-09-23 DIAGNOSIS — F331 Major depressive disorder, recurrent, moderate: Secondary | ICD-10-CM | POA: Diagnosis not present

## 2021-09-29 ENCOUNTER — Encounter: Payer: Self-pay | Admitting: Podiatry

## 2021-10-07 ENCOUNTER — Encounter: Payer: Self-pay | Admitting: Orthopedic Surgery

## 2021-10-07 ENCOUNTER — Telehealth: Payer: Self-pay

## 2021-10-07 ENCOUNTER — Ambulatory Visit: Payer: BC Managed Care – PPO | Admitting: Orthopedic Surgery

## 2021-10-07 DIAGNOSIS — M170 Bilateral primary osteoarthritis of knee: Secondary | ICD-10-CM | POA: Diagnosis not present

## 2021-10-07 DIAGNOSIS — M1712 Unilateral primary osteoarthritis, left knee: Secondary | ICD-10-CM | POA: Diagnosis not present

## 2021-10-07 DIAGNOSIS — F331 Major depressive disorder, recurrent, moderate: Secondary | ICD-10-CM | POA: Diagnosis not present

## 2021-10-07 MED ORDER — LIDOCAINE HCL 1 % IJ SOLN
5.0000 mL | INTRAMUSCULAR | Status: AC | PRN
  Administered 2021-10-07: 5 mL

## 2021-10-07 MED ORDER — BUPIVACAINE HCL 0.25 % IJ SOLN
4.0000 mL | INTRAMUSCULAR | Status: AC | PRN
  Administered 2021-10-07: 4 mL via INTRA_ARTICULAR

## 2021-10-07 MED ORDER — METHYLPREDNISOLONE ACETATE 40 MG/ML IJ SUSP
40.0000 mg | INTRAMUSCULAR | Status: AC | PRN
  Administered 2021-10-07: 40 mg via INTRA_ARTICULAR

## 2021-10-07 NOTE — Telephone Encounter (Signed)
Authorization has been submitted for SynviscOne, Bilateral knee through Covermymeds.  PA pending# [id number]  Spoke with Eustace Moore. With [employer] and advised that PA was received and is pending decision

## 2021-10-07 NOTE — Telephone Encounter (Signed)
Auth needed for bilat knee gel injection  

## 2021-10-07 NOTE — Telephone Encounter (Signed)
PA has been submitted for SynviscOne, bilateral. Will submit for benefits

## 2021-10-07 NOTE — Progress Notes (Signed)
Overall  Office Visit Note   Patient: Dominique Mcpherson           Date of Birth: 02/01/1966           MRN: [id number] Visit Date: 10/07/2021 Requested by: Lujean Amel, MD [place] [address],  [place] [address] PCP: Lujean Amel, MD  Subjective: Chief Complaint  Patient presents with   Right Knee - Pain   Left Knee - Pain    HPI: Dominique Mcpherson is a 56 year old patient with known bilateral knee arthritis.  Would like to have the left knee injected today.  Has not had an injection in about 6 months.  No interval history of injury or real change in symptoms other than severe pain with ambulation in that left knee.              ROS: All systems reviewed are negative as they relate to the chief complaint within the history of present illness.  Patient denies  fevers or chills.   Assessment & Plan: Visit Diagnoses:  1. Primary osteoarthritis of knees, bilateral     Plan: Impression is left knee arthritis.  Plan is left knee cortisone injection today.  Continue with nonweightbearing quad strengthening exercises and follow-up as needed  Follow-Up Instructions: Return if symptoms worsen or fail to improve.   Orders:  No orders of the defined types were placed in this encounter.  No orders of the defined types were placed in this encounter.     Procedures: Large Joint Inj: L knee on 10/07/2021 11:06 PM Indications: diagnostic evaluation, joint swelling and pain Details: 18 G 1.5 in needle, superolateral approach  Arthrogram: No  Medications: 5 mL lidocaine 1 %; 40 mg methylPREDNISolone acetate 40 MG/ML; 4 mL bupivacaine 0.25 % Outcome: tolerated well, no immediate complications Procedure, treatment alternatives, risks and benefits explained, specific risks discussed. Consent was given by the patient. Immediately prior to procedure a time out was called to verify the correct patient, procedure, equipment, support staff and site/side marked as required. Patient was prepped  and draped in the usual sterile fashion.      Clinical Data: No additional findings.  Objective: Vital Signs: LMP 07/13/2017   Physical Exam:   Constitutional: Patient appears well-developed HEENT:  Head: Normocephalic Eyes:EOM are normal Neck: Normal range of motion Cardiovascular: Normal rate Pulmonary/chest: Effort normal Neurologic: Patient is alert Skin: Skin is warm Psychiatric: Patient has normal mood and affect   Ortho Exam: Ortho exam demonstrates slight antalgic gait to the left.  Extensor mechanism intact.  Range of motion of flexion in the left knee is slightly less than 90.  Extensor mechanism intact.  Specialty Comments:  No specialty comments available.  Imaging: No results found.   PMFS History: Patient Active Problem List   Diagnosis Date Noted   Acquired hypothyroidism 10/24/2020   Allergic rhinitis 10/24/2020   Amnesia 10/24/2020   Arthritis of hip 10/24/2020   Chronic pain 10/24/2020   Family history of malignant neoplasm of gastrointestinal tract 10/24/2020   Gastro-esophageal reflux disease without esophagitis 10/24/2020   Localized, primary osteoarthritis of hand 10/24/2020   Meralgia paresthetica 10/24/2020   Moderate major depression ([place]) 10/24/2020   Scapulalgia 10/24/2020   Pain in right hip 10/24/2020   Vitamin D deficiency 10/24/2020   OA (osteoarthritis) of knee 07/01/2020   Knee pain, left 07/01/2020   Left elbow pain 06/11/2016   Achilles tendon pain 06/11/2016   Melanoma of upper arm ([place]) 05/09/2013   Morbid obesity ([place])  11/29/2012   Past Medical History:  Diagnosis Date   Acid reflux    Anxiety    Depression    Hypothyroidism    Knee osteoarthritis    with degenerative medial meniscus tear   Meniscus tear    Osteoarthritis     Family History  Problem Relation Age of Onset   Cancer Paternal Grandmother        colon   Liver cancer Paternal Grandmother    Heart disease Maternal Grandmother    Stroke Mother     Hypertension Mother    Hypertension Brother    Breast cancer Neg Hx     Past Surgical History:  Procedure Laterality Date   CHOLECYSTECTOMY     FOOT SURGERY     bilateral plantar fascitis   HYSTEROSCOPY WITH D & C N/A 01/31/2013   Procedure: DILATATION AND CURETTAGE  TRU CLEAR /HYSTEROSCOPY;  Surgeon: Azalia Bilis, MD;  Location: [facility];  Service: Gynecology;  Laterality: N/A;   MELANOMA EXCISION Right 05/17/2013   Procedure: WIDE EXCISION MELANOMA RIGHT UPPER ARM;  Surgeon: Harl Bowie, MD;  Location: [facility];  Service: General;  Laterality: Right;   TONSILLECTOMY     UPPER GI ENDOSCOPY  3/15   Social History   Occupational History   Not on file  Tobacco Use   Smoking status: Never   Smokeless tobacco: Never  Vaping Use   Vaping Use: Never used  Substance and Sexual Activity   Alcohol use: Yes    Alcohol/week: 2.0 standard drinks    Types: 2 Standard drinks or equivalent per week    Comment: occ   Drug use: No   Sexual activity: Never    Partners: Male    Birth control/protection: Abstinence

## 2021-10-14 ENCOUNTER — Telehealth: Payer: Self-pay

## 2021-10-14 NOTE — Telephone Encounter (Signed)
FYI  Per [place], PA has been denied for SynviscOne, bilateral knee.  Medical documentation is required to show that patient is using less pain medication and to show that patient has had improvement.  Per [employer], a peer to peer can be done. Number to call is [phone].  Please advise on next option for patient.

## 2021-10-15 NOTE — Telephone Encounter (Signed)
I think a P2P is needed.  All of patient's office notes were faxed to [facility], but I don't think that they are reading them completely.

## 2021-10-15 NOTE — Telephone Encounter (Signed)
From Dr Randel Pigg note 10/09/2020: "She is had good results with Synvisc/gel injection performed in January [2022]" She had injection on 05/26/20 and did not have to follow up for knee pain until 10/09/20.  Does this info suffice or do I need to do P2P?

## 2021-10-16 NOTE — Telephone Encounter (Signed)
[phone] is the correct number to call for P2P.

## 2021-10-16 NOTE — Telephone Encounter (Signed)
Noted.  I will see if I can find a better number if possible

## 2021-10-16 NOTE — Telephone Encounter (Signed)
I have tried 2 different times to call that number and set up peer to peer.  In 1 of these attempts, I had to explain the concept of a peer to peer to the insurance representative.  Not really making much progress.  I will try again later today

## 2021-10-21 DIAGNOSIS — F331 Major depressive disorder, recurrent, moderate: Secondary | ICD-10-CM | POA: Diagnosis not present

## 2021-11-02 ENCOUNTER — Telehealth: Payer: Self-pay | Admitting: Surgical

## 2021-11-02 NOTE — Telephone Encounter (Signed)
Noted.  Will submit.  

## 2021-11-02 NOTE — Telephone Encounter (Signed)
I called patient last week to discuss her knee pain.  Discussed that she has had prior Synvisc/gel injection that was performed in January 2022.  She had injection on 05/26/2020 and did not have to follow-up for knee pain until 10/09/2020.  She used less pain medication over the course between January and June due to the relief that she got from the injection.  She notes that she absolutely recalls that she had good relief from viscosupplementation injection.  She still complains of knee pain.  Plan to try peer to peer for bilateral knee gel injections.

## 2021-11-02 NOTE — Telephone Encounter (Signed)
Hi April, I submitted a telephone encounter after speaking with Sandrea the other week.  Hopefully the contents of our conversation will satisfy the insurance company.  Can you submit this recent telephone encounter to her insurance?  If they still need peer to peer after this, I am happy to do it

## 2021-11-07 ENCOUNTER — Ambulatory Visit: Payer: BC Managed Care – PPO | Attending: Internal Medicine

## 2021-11-07 DIAGNOSIS — Z23 Encounter for immunization: Secondary | ICD-10-CM

## 2021-11-07 NOTE — Progress Notes (Signed)
   Covid-19 Vaccination Clinic  Name:  Dominique Mcpherson    MRN: [id number] DOB: Nov 21, 1965  11/07/2021  [name] was observed post Covid-19 immunization for 15 minutes without incident. She was provided with Vaccine Information Sheet and instruction to access the V-Safe system.   [name] was instructed to call 911 with any severe reactions post vaccine: Difficulty breathing  Swelling of face and throat  A fast heartbeat  A bad rash all over body  Dizziness and weakness   Immunizations Administered     Name Date Dose VIS Date Route   Pfizer Covid-19 Vaccine Bivalent Booster 11/07/2021 11:25 AM 0.3 mL 12/31/2020 Intramuscular   Manufacturer: [place]   Lot: [id number]   [place]: [phone]

## 2021-11-13 NOTE — Telephone Encounter (Signed)
Submitted Appeal for SynviscOne, bilateral knee

## 2021-11-17 ENCOUNTER — Other Ambulatory Visit (HOSPITAL_BASED_OUTPATIENT_CLINIC_OR_DEPARTMENT_OTHER): Payer: Self-pay

## 2021-11-17 MED ORDER — PFIZER COVID-19 VAC BIVALENT 30 MCG/0.3ML IM SUSP
INTRAMUSCULAR | 0 refills | Status: DC
Start: 2021-11-07 — End: 2022-06-09
  Filled 2021-11-17: qty 0.3, 1d supply, fill #0

## 2021-11-18 DIAGNOSIS — F331 Major depressive disorder, recurrent, moderate: Secondary | ICD-10-CM | POA: Diagnosis not present

## 2021-11-19 ENCOUNTER — Other Ambulatory Visit: Payer: Self-pay

## 2021-11-19 DIAGNOSIS — M170 Bilateral primary osteoarthritis of knee: Secondary | ICD-10-CM

## 2021-12-02 DIAGNOSIS — F331 Major depressive disorder, recurrent, moderate: Secondary | ICD-10-CM | POA: Diagnosis not present

## 2021-12-09 ENCOUNTER — Ambulatory Visit: Payer: BC Managed Care – PPO | Admitting: Orthopedic Surgery

## 2021-12-09 ENCOUNTER — Other Ambulatory Visit (HOSPITAL_COMMUNITY): Payer: Self-pay

## 2021-12-09 DIAGNOSIS — M170 Bilateral primary osteoarthritis of knee: Secondary | ICD-10-CM

## 2021-12-09 DIAGNOSIS — Z79899 Other long term (current) drug therapy: Secondary | ICD-10-CM | POA: Diagnosis not present

## 2021-12-09 DIAGNOSIS — F902 Attention-deficit hyperactivity disorder, combined type: Secondary | ICD-10-CM | POA: Diagnosis not present

## 2021-12-09 DIAGNOSIS — F331 Major depressive disorder, recurrent, moderate: Secondary | ICD-10-CM | POA: Diagnosis not present

## 2021-12-09 MED ORDER — DEXMETHYLPHENIDATE HCL ER 20 MG PO CP24
20.0000 mg | ORAL_CAPSULE | Freq: Every day | ORAL | 0 refills | Status: DC
  Filled 2021-12-09: qty 30, 30d supply, fill #0

## 2021-12-11 ENCOUNTER — Encounter: Payer: Self-pay | Admitting: Orthopedic Surgery

## 2021-12-11 DIAGNOSIS — M1712 Unilateral primary osteoarthritis, left knee: Secondary | ICD-10-CM

## 2021-12-11 MED ORDER — LIDOCAINE HCL 1 % IJ SOLN
5.0000 mL | INTRAMUSCULAR | Status: AC | PRN
  Administered 2021-12-11: 5 mL

## 2021-12-11 MED ORDER — HYLAN G-F 20 48 MG/6ML IX SOSY
48.0000 mg | PREFILLED_SYRINGE | INTRA_ARTICULAR | Status: AC | PRN
  Administered 2021-12-11: 48 mg via INTRA_ARTICULAR

## 2021-12-11 NOTE — Progress Notes (Signed)
   Procedure Note  Patient: Dominique Mcpherson             Date of Birth: Mar 11, 1966           MRN: [id number]             Visit Date: 12/09/2021  Procedures: Visit Diagnoses:  1. Primary osteoarthritis of knees, bilateral     Large Joint Inj: L knee on 12/11/2021 6:42 AM Indications: pain, joint swelling and diagnostic evaluation Details: 18 G 1.5 in needle, superolateral approach  Arthrogram: No  Medications: 5 mL lidocaine 1 %; 48 mg Hylan 48 MG/6ML Outcome: tolerated well, no immediate complications Procedure, treatment alternatives, risks and benefits explained, specific risks discussed. Consent was given by the patient. Immediately prior to procedure a time out was called to verify the correct patient, procedure, equipment, support staff and site/side marked as required. Patient was prepped and draped in the usual sterile fashion.

## 2021-12-17 DIAGNOSIS — F902 Attention-deficit hyperactivity disorder, combined type: Secondary | ICD-10-CM | POA: Diagnosis not present

## 2022-01-06 DIAGNOSIS — F331 Major depressive disorder, recurrent, moderate: Secondary | ICD-10-CM | POA: Diagnosis not present

## 2022-01-13 DIAGNOSIS — F331 Major depressive disorder, recurrent, moderate: Secondary | ICD-10-CM | POA: Diagnosis not present

## 2022-01-20 DIAGNOSIS — F331 Major depressive disorder, recurrent, moderate: Secondary | ICD-10-CM | POA: Diagnosis not present

## 2022-01-21 DIAGNOSIS — L821 Other seborrheic keratosis: Secondary | ICD-10-CM | POA: Diagnosis not present

## 2022-01-21 DIAGNOSIS — L609 Nail disorder, unspecified: Secondary | ICD-10-CM | POA: Diagnosis not present

## 2022-01-21 DIAGNOSIS — D225 Melanocytic nevi of trunk: Secondary | ICD-10-CM | POA: Diagnosis not present

## 2022-01-21 DIAGNOSIS — L814 Other melanin hyperpigmentation: Secondary | ICD-10-CM | POA: Diagnosis not present

## 2022-01-24 ENCOUNTER — Other Ambulatory Visit (HOSPITAL_COMMUNITY): Payer: Self-pay

## 2022-01-24 DIAGNOSIS — T148XXA Other injury of unspecified body region, initial encounter: Secondary | ICD-10-CM | POA: Diagnosis not present

## 2022-01-26 ENCOUNTER — Other Ambulatory Visit (HOSPITAL_COMMUNITY): Payer: Self-pay

## 2022-01-26 MED ORDER — DEXMETHYLPHENIDATE HCL ER 20 MG PO CP24
20.0000 mg | ORAL_CAPSULE | Freq: Every day | ORAL | 0 refills | Status: DC
Start: 2022-01-25 — End: 2022-03-11
  Filled 2022-01-26: qty 30, 30d supply, fill #0

## 2022-02-03 DIAGNOSIS — F331 Major depressive disorder, recurrent, moderate: Secondary | ICD-10-CM | POA: Diagnosis not present

## 2022-02-10 DIAGNOSIS — F331 Major depressive disorder, recurrent, moderate: Secondary | ICD-10-CM | POA: Diagnosis not present

## 2022-02-17 DIAGNOSIS — F331 Major depressive disorder, recurrent, moderate: Secondary | ICD-10-CM | POA: Diagnosis not present

## 2022-03-11 ENCOUNTER — Other Ambulatory Visit (HOSPITAL_COMMUNITY): Payer: Self-pay

## 2022-03-11 DIAGNOSIS — Z79899 Other long term (current) drug therapy: Secondary | ICD-10-CM | POA: Diagnosis not present

## 2022-03-11 DIAGNOSIS — F902 Attention-deficit hyperactivity disorder, combined type: Secondary | ICD-10-CM | POA: Diagnosis not present

## 2022-03-11 MED ORDER — DEXMETHYLPHENIDATE HCL ER 20 MG PO CP24
20.0000 mg | ORAL_CAPSULE | Freq: Every day | ORAL | 0 refills | Status: DC
  Filled 2022-03-11: qty 30, 30d supply, fill #0

## 2022-03-16 ENCOUNTER — Other Ambulatory Visit (HOSPITAL_COMMUNITY): Payer: Self-pay

## 2022-03-17 DIAGNOSIS — F331 Major depressive disorder, recurrent, moderate: Secondary | ICD-10-CM | POA: Diagnosis not present

## 2022-03-19 ENCOUNTER — Other Ambulatory Visit (HOSPITAL_COMMUNITY): Payer: Self-pay

## 2022-03-24 DIAGNOSIS — Z23 Encounter for immunization: Secondary | ICD-10-CM | POA: Diagnosis not present

## 2022-03-24 DIAGNOSIS — E039 Hypothyroidism, unspecified: Secondary | ICD-10-CM | POA: Diagnosis not present

## 2022-03-24 DIAGNOSIS — Z0000 Encounter for general adult medical examination without abnormal findings: Secondary | ICD-10-CM | POA: Diagnosis not present

## 2022-03-31 DIAGNOSIS — F331 Major depressive disorder, recurrent, moderate: Secondary | ICD-10-CM | POA: Diagnosis not present

## 2022-04-07 DIAGNOSIS — F331 Major depressive disorder, recurrent, moderate: Secondary | ICD-10-CM | POA: Diagnosis not present

## 2022-04-14 DIAGNOSIS — F331 Major depressive disorder, recurrent, moderate: Secondary | ICD-10-CM | POA: Diagnosis not present

## 2022-04-21 DIAGNOSIS — F331 Major depressive disorder, recurrent, moderate: Secondary | ICD-10-CM | POA: Diagnosis not present

## 2022-05-08 ENCOUNTER — Telehealth: Payer: BC Managed Care – PPO | Admitting: Nurse Practitioner

## 2022-05-08 DIAGNOSIS — J329 Chronic sinusitis, unspecified: Secondary | ICD-10-CM

## 2022-05-08 DIAGNOSIS — B9689 Other specified bacterial agents as the cause of diseases classified elsewhere: Secondary | ICD-10-CM | POA: Diagnosis not present

## 2022-05-08 MED ORDER — PREDNISONE 20 MG PO TABS: 20.0000 mg | ORAL_TABLET | Freq: Every day | ORAL | 0 refills | Status: AC

## 2022-05-08 MED ORDER — AMOXICILLIN-POT CLAVULANATE 875-125 MG PO TABS: 1.0000 | ORAL_TABLET | Freq: Two times a day (BID) | ORAL | 0 refills | Status: AC

## 2022-05-08 MED ORDER — PROMETHAZINE-DM 6.25-15 MG/5ML PO SYRP: 5.0000 mL | ORAL_SOLUTION | Freq: Four times a day (QID) | ORAL | 0 refills | Status: DC | PRN

## 2022-05-08 NOTE — Patient Instructions (Signed)
Dominique Mcpherson, thank you for joining Gildardo Pounds, NP for today's virtual visit.  While this provider is not your primary care provider (PCP), if your PCP is located in our provider database this encounter information will be shared with them immediately following your visit.   [facility] account gives you access to today's visit and all your visits, tests, and labs performed at [facility] " click here if you don't have a [facility] account or go to [email or web address]  Consent: (Patient) Dominique Mcpherson provided verbal consent for this virtual visit at the beginning of the encounter.  Current Medications:  Current Outpatient Medications:    amoxicillin-clavulanate (AUGMENTIN) 875-125 MG tablet, Take 1 tablet by mouth 2 (two) times daily for 7 days., Disp: 14 tablet, Rfl: 0   predniSONE (DELTASONE) 20 MG tablet, Take 1 tablet (20 mg total) by mouth daily with breakfast for 5 days., Disp: 5 tablet, Rfl: 0   promethazine-dextromethorphan (PROMETHAZINE-DM) 6.25-15 MG/5ML syrup, Take 5 mLs by mouth 4 (four) times daily as needed for cough., Disp: 240 mL, Rfl: 0   acetaminophen (TYLENOL) 500 MG tablet, Take 1,000 mg by mouth as needed. , Disp: , Rfl:    benzonatate (TESSALON) 100 MG capsule, Take 1 capsule (100 mg total) by mouth every 8 (eight) hours as needed for cough., Disp: 21 capsule, Rfl: 0   cetirizine (ZYRTEC) 10 MG tablet, Take 10 mg by mouth as needed., Disp: , Rfl:    COVID-19 mRNA bivalent vaccine, Pfizer, (PFIZER COVID-19 VAC BIVALENT) injection, Inject into the muscle., Disp: 0.3 mL, Rfl: 0   dexmethylphenidate (FOCALIN XR) 20 MG 24 hr capsule, Take 1 capsule (20 mg total) by mouth daily., Disp: 30 capsule, Rfl: 0   fluticasone (FLONASE) 50 MCG/ACT nasal spray, 1 spray in each nostril, Disp: , Rfl:    fluticasone (FLONASE) 50 MCG/ACT nasal spray, Place 1 spray into both nostrils daily for 3 days., Disp: 16 g, Rfl: 0   glucosamine-chondroitin  500-400 MG tablet, Take 1 tablet by mouth daily., Disp: , Rfl:    guanFACINE (INTUNIV) 4 MG TB24 ER tablet, Take 4 mg by mouth daily., Disp: , Rfl:    levothyroxine (SYNTHROID) 75 MCG tablet, 1 tablet, Disp: , Rfl:    naproxen sodium (ALEVE) 220 MG tablet, 1 tablet, Disp: , Rfl:    omeprazole (PRILOSEC) 20 MG capsule, 1 capsule, Disp: , Rfl:    phenol (CHLORASEPTIC) 1.4 % LIQD, Use as directed 1 spray in the mouth or throat as needed for throat irritation / pain., Disp: 118 mL, Rfl: 0   phentermine 30 MG capsule, Take 30 mg by mouth every morning., Disp: , Rfl:    Medications ordered in this encounter:  Meds ordered this encounter  Medications   promethazine-dextromethorphan (PROMETHAZINE-DM) 6.25-15 MG/5ML syrup    Sig: Take 5 mLs by mouth 4 (four) times daily as needed for cough.    Dispense:  240 mL    Refill:  0    Order Specific Question:   Supervising Provider    Answer:   Chase Picket [id number]   amoxicillin-clavulanate (AUGMENTIN) 875-125 MG tablet    Sig: Take 1 tablet by mouth 2 (two) times daily for 7 days.    Dispense:  14 tablet    Refill:  0    Order Specific Question:   Supervising Provider    Answer:   Chase Picket [[id number]]   predniSONE (DELTASONE) 20 MG tablet    Sig: Take  1 tablet (20 mg total) by mouth daily with breakfast for 5 days.    Dispense:  5 tablet    Refill:  0    Order Specific Question:   Supervising Provider    Answer:   Chase Picket [id number]     *If you need refills on other medications prior to your next appointment, please contact your pharmacy*  Follow-Up: Call back or seek an in-person evaluation if the symptoms worsen or if the condition fails to improve as anticipated.  [facility] [phone]  Other Instructions INSTRUCTIONS: use a humidifier for nasal congestion Drink plenty of fluids, rest and wash hands frequently to avoid the spread of infection Alternate tylenol and Motrin for relief of fever     If you have been instructed to have an in-person evaluation today at a local Urgent Care facility, please use the link below. It will take you to a list of all of our available [facility], including address, phone number and hours of operation. Please do not delay care.  [facility]  If you or a family member do not have a primary care provider, use the link below to schedule a visit and establish care. When you choose a [facility] primary care physician or advanced practice provider, you gain a long-term partner in health. Find a Primary Care Provider  Learn more about [facility]'s in-office and virtual care options: [facility]

## 2022-05-08 NOTE — Progress Notes (Signed)
Virtual Visit Consent   Dominique Mcpherson, you are scheduled for a virtual visit with a [facility] provider today. Just as with appointments in the office, your consent must be obtained to participate. Your consent will be active for this visit and any virtual visit you may have with one of our providers in the next 365 days. If you have a MyChart account, a copy of this consent can be sent to you electronically.  As this is a virtual visit, video technology does not allow for your provider to perform a traditional examination. This may limit your provider's ability to fully assess your condition. If your provider identifies any concerns that need to be evaluated in person or the need to arrange testing (such as labs, EKG, etc.), we will make arrangements to do so. Although advances in technology are sophisticated, we cannot ensure that it will always work on either your end or our end. If the connection with a video visit is poor, the visit may have to be switched to a telephone visit. With either a video or telephone visit, we are not always able to ensure that we have a secure connection.  By engaging in this virtual visit, you consent to the provision of healthcare and authorize for your insurance to be billed (if applicable) for the services provided during this visit. Depending on your insurance coverage, you may receive a charge related to this service.  I need to obtain your verbal consent now. Are you willing to proceed with your visit today? Dominique Mcpherson has provided verbal consent on 05/08/2022 for a virtual visit (video or telephone). Gildardo Pounds, NP  Date: 05/08/2022 1:52 PM  Virtual Visit via Video Note   I, Gildardo Pounds, connected with  Dominique Mcpherson  ([id number], 06-Feb-1966) on 05/08/22 at  2:00 PM EST by a video-enabled telemedicine application and verified that I am speaking with the correct person using two identifiers.  Location: Patient: Virtual Visit Location Patient:  Home Provider: Virtual Visit Location Provider: Home Office   I discussed the limitations of evaluation and management by telemedicine and the availability of in person appointments. The patient expressed understanding and agreed to proceed.    History of Present Illness: Dominique Mcpherson is a 57 y.o. who identifies as a female who was assigned female at birth, and is being seen today for sinusitis with cough   Ms Bartko reports the following symptoms for the past 2 weeks::headache, frontal and maxillary pressure, dental pain, productive cough with thick purulent sputum, post nasal drip.  Lastr recorded fever 3 days ago was 100.4. Negative COVID test.  Currently using Dayquil, Nyquil, tessalon, Flonase, Zicam.   Problems:  Patient Active Problem List   Diagnosis Date Noted   Acquired hypothyroidism 10/24/2020   Allergic rhinitis 10/24/2020   Amnesia 10/24/2020   Arthritis of hip 10/24/2020   Chronic pain 10/24/2020   Family history of malignant neoplasm of gastrointestinal tract 10/24/2020   Gastro-esophageal reflux disease without esophagitis 10/24/2020   Localized, primary osteoarthritis of hand 10/24/2020   Meralgia paresthetica 10/24/2020   Moderate major depression ([place]) 10/24/2020   Scapulalgia 10/24/2020   Pain in right hip 10/24/2020   Vitamin D deficiency 10/24/2020   OA (osteoarthritis) of knee 07/01/2020   Knee pain, left 07/01/2020   Left elbow pain 06/11/2016   Achilles tendon pain 06/11/2016   Melanoma of upper arm ([place]) 05/09/2013   Morbid obesity ([place]) 11/29/2012    Allergies:  Allergies  Allergen  Reactions   Bee Venom Swelling and Other (See Comments)   Medications:  Current Outpatient Medications:    amoxicillin-clavulanate (AUGMENTIN) 875-125 MG tablet, Take 1 tablet by mouth 2 (two) times daily for 7 days., Disp: 14 tablet, Rfl: 0   predniSONE (DELTASONE) 20 MG tablet, Take 1 tablet (20 mg total) by mouth daily with breakfast for 5 days., Disp: 5 tablet,  Rfl: 0   promethazine-dextromethorphan (PROMETHAZINE-DM) 6.25-15 MG/5ML syrup, Take 5 mLs by mouth 4 (four) times daily as needed for cough., Disp: 240 mL, Rfl: 0   acetaminophen (TYLENOL) 500 MG tablet, Take 1,000 mg by mouth as needed. , Disp: , Rfl:    benzonatate (TESSALON) 100 MG capsule, Take 1 capsule (100 mg total) by mouth every 8 (eight) hours as needed for cough., Disp: 21 capsule, Rfl: 0   cetirizine (ZYRTEC) 10 MG tablet, Take 10 mg by mouth as needed., Disp: , Rfl:    COVID-19 mRNA bivalent vaccine, Pfizer, (PFIZER COVID-19 VAC BIVALENT) injection, Inject into the muscle., Disp: 0.3 mL, Rfl: 0   dexmethylphenidate (FOCALIN XR) 20 MG 24 hr capsule, Take 1 capsule (20 mg total) by mouth daily., Disp: 30 capsule, Rfl: 0   fluticasone (FLONASE) 50 MCG/ACT nasal spray, 1 spray in each nostril, Disp: , Rfl:    fluticasone (FLONASE) 50 MCG/ACT nasal spray, Place 1 spray into both nostrils daily for 3 days., Disp: 16 g, Rfl: 0   glucosamine-chondroitin 500-400 MG tablet, Take 1 tablet by mouth daily., Disp: , Rfl:    guanFACINE (INTUNIV) 4 MG TB24 ER tablet, Take 4 mg by mouth daily., Disp: , Rfl:    levothyroxine (SYNTHROID) 75 MCG tablet, 1 tablet, Disp: , Rfl:    naproxen sodium (ALEVE) 220 MG tablet, 1 tablet, Disp: , Rfl:    omeprazole (PRILOSEC) 20 MG capsule, 1 capsule, Disp: , Rfl:    phenol (CHLORASEPTIC) 1.4 % LIQD, Use as directed 1 spray in the mouth or throat as needed for throat irritation / pain., Disp: 118 mL, Rfl: 0   phentermine 30 MG capsule, Take 30 mg by mouth every morning., Disp: , Rfl:   Observations/Objective: Patient is well-developed, well-nourished in no acute distress.  Resting comfortably at home.  Head is normocephalic, atraumatic.  No labored breathing. Very productive harsh cough Speech is clear and coherent with logical content.  Patient is alert and oriented at baseline.    Assessment and Plan: 1. Bacterial sinusitis - promethazine-dextromethorphan  (PROMETHAZINE-DM) 6.25-15 MG/5ML syrup; Take 5 mLs by mouth 4 (four) times daily as needed for cough.  Dispense: 240 mL; Refill: 0 - amoxicillin-clavulanate (AUGMENTIN) 875-125 MG tablet; Take 1 tablet by mouth 2 (two) times daily for 7 days.  Dispense: 14 tablet; Refill: 0 - predniSONE (DELTASONE) 20 MG tablet; Take 1 tablet (20 mg total) by mouth daily with breakfast for 5 days.  Dispense: 5 tablet; Refill: 0 INSTRUCTIONS: use a humidifier for nasal congestion Drink plenty of fluids, rest and wash hands frequently to avoid the spread of infection Alternate tylenol and Motrin for relief of fever   Follow Up Instructions: I discussed the assessment and treatment plan with the patient. The patient was provided an opportunity to ask questions and all were answered. The patient agreed with the plan and demonstrated an understanding of the instructions.  A copy of instructions were sent to the patient via MyChart unless otherwise noted below.    The patient was advised to call back or seek an in-person evaluation if the symptoms worsen  or if the condition fails to improve as anticipated.  Time:  I spent 11 minutes with the patient via telehealth technology discussing the above problems/concerns.    Gildardo Pounds, NP

## 2022-05-12 DIAGNOSIS — F331 Major depressive disorder, recurrent, moderate: Secondary | ICD-10-CM | POA: Diagnosis not present

## 2022-05-19 DIAGNOSIS — F331 Major depressive disorder, recurrent, moderate: Secondary | ICD-10-CM | POA: Diagnosis not present

## 2022-06-02 DIAGNOSIS — F331 Major depressive disorder, recurrent, moderate: Secondary | ICD-10-CM | POA: Diagnosis not present

## 2022-06-09 ENCOUNTER — Other Ambulatory Visit (HOSPITAL_COMMUNITY)
Admission: RE | Admit: 2022-06-09 | Discharge: 2022-06-09 | Disposition: A | Discharge status: Home / Self Care | Payer: BC Managed Care – PPO | Source: Ambulatory Visit | Attending: Obstetrics & Gynecology | Admitting: Obstetrics & Gynecology

## 2022-06-09 ENCOUNTER — Ambulatory Visit (INDEPENDENT_AMBULATORY_CARE_PROVIDER_SITE_OTHER): Payer: BC Managed Care – PPO | Admitting: Obstetrics & Gynecology

## 2022-06-09 ENCOUNTER — Encounter (HOSPITAL_BASED_OUTPATIENT_CLINIC_OR_DEPARTMENT_OTHER): Payer: Self-pay | Admitting: Obstetrics & Gynecology

## 2022-06-09 ENCOUNTER — Other Ambulatory Visit (HOSPITAL_BASED_OUTPATIENT_CLINIC_OR_DEPARTMENT_OTHER): Payer: Self-pay

## 2022-06-09 VITALS — BP 130/73 | HR 72 | Ht 63.0 in | Wt 327.4 lb

## 2022-06-09 DIAGNOSIS — Z79891 Long term (current) use of opiate analgesic: Secondary | ICD-10-CM | POA: Diagnosis not present

## 2022-06-09 DIAGNOSIS — F902 Attention-deficit hyperactivity disorder, combined type: Secondary | ICD-10-CM | POA: Diagnosis not present

## 2022-06-09 DIAGNOSIS — C4361 Malignant melanoma of right upper limb, including shoulder: Secondary | ICD-10-CM | POA: Diagnosis not present

## 2022-06-09 DIAGNOSIS — Z5181 Encounter for therapeutic drug level monitoring: Secondary | ICD-10-CM | POA: Diagnosis not present

## 2022-06-09 DIAGNOSIS — Z01419 Encounter for gynecological examination (general) (routine) without abnormal findings: Secondary | ICD-10-CM

## 2022-06-09 DIAGNOSIS — Z124 Encounter for screening for malignant neoplasm of cervix: Secondary | ICD-10-CM

## 2022-06-09 DIAGNOSIS — Z79899 Other long term (current) drug therapy: Secondary | ICD-10-CM | POA: Diagnosis not present

## 2022-06-09 DIAGNOSIS — Z800 Family history of malignant neoplasm of digestive organs: Secondary | ICD-10-CM

## 2022-06-09 NOTE — Progress Notes (Signed)
57 y.o. G0P0000 Single White or Caucasian female here for annual exam.  Denies vaginal bleeding.  Doing well.  Has been referred for colonoscopy.  Brought lab work with her from. Dr. Dorthy Cooler.  Updated medications and vaccines today as well.  Patient's last menstrual period was 07/01/2017 (approximate).          Sexually active: No.  The current method of family planning is none and post menopausal status.     Smoker:  no  Health Maintenance: Pap:  05/01/2019 Negative History of abnormal Pap:  no MMG:  09/18/2021 Negative Colonoscopy:  07/23/2013 BMD:  guidelines reviewed Screening Labs: done with PCP on 03/28/2022   reports that she has never smoked. She has never used smokeless tobacco. She reports current alcohol use of about 2.0 standard drinks of alcohol per week. She reports that she does not use drugs.  Past Medical History:  Diagnosis Date   Acid reflux    Anxiety    Depression    Hypothyroidism    Knee osteoarthritis    with degenerative medial meniscus tear   Meniscus tear    Osteoarthritis     Past Surgical History:  Procedure Laterality Date   CHOLECYSTECTOMY     FOOT SURGERY     bilateral plantar fascitis   HYSTEROSCOPY WITH D & C N/A 01/31/2013   Procedure: DILATATION AND CURETTAGE  TRU CLEAR /HYSTEROSCOPY;  Surgeon: Azalia Bilis, MD;  Location: [facility];  Service: Gynecology;  Laterality: N/A;   MELANOMA EXCISION Right 05/17/2013   Procedure: WIDE EXCISION MELANOMA RIGHT UPPER ARM;  Surgeon: Harl Bowie, MD;  Location: [facility];  Service: General;  Laterality: Right;   TONSILLECTOMY     UPPER GI ENDOSCOPY  3/15    Current Outpatient Medications  Medication Sig Dispense Refill   acetaminophen (TYLENOL) 500 MG tablet Take 1,000 mg by mouth as needed.      cetirizine (ZYRTEC) 10 MG tablet Take 10 mg by mouth as needed.     glucosamine-chondroitin 500-400 MG tablet Take 1 tablet by mouth daily.     guanFACINE (INTUNIV) 4 MG TB24 ER tablet  Take 4 mg by mouth daily.     levothyroxine (SYNTHROID) 75 MCG tablet 1 tablet     naproxen sodium (ALEVE) 220 MG tablet 1 tablet     omeprazole (PRILOSEC) 20 MG capsule 1 capsule     No current facility-administered medications for this visit.    Family History  Problem Relation Age of Onset   Cancer Paternal Grandmother        colon   Liver cancer Paternal Grandmother    Heart disease Maternal Grandmother    Stroke Mother    Hypertension Mother    Hypertension Brother    Breast cancer Neg Hx     ROS: Constitutional: negative Genitourinary:negative  Exam:   BP 130/73 (BP Location: Right Arm, Patient Position: Sitting, Cuff Size: Large) Comment (BP Location): Right lower extremity  Pulse 72   Ht '5\' 3"'$  (1.6 m) Comment: Reported  Wt (!) 327 lb 6.4 oz (148.5 kg)   LMP 07/01/2017 (Approximate)   BMI 58.00 kg/m   Height: '5\' 3"'$  (160 cm) (Reported)  General appearance: alert, cooperative and appears stated age Head: Normocephalic, without obvious abnormality, atraumatic Neck: no adenopathy, supple, symmetrical, trachea midline and thyroid normal to inspection and palpation Lungs: clear to auscultation bilaterally Breasts: normal appearance, no masses or tenderness Heart: regular rate and rhythm Abdomen: soft, non-tender; bowel sounds normal; no  masses,  no organomegaly Extremities: extremities normal, atraumatic, no cyanosis or edema Skin: Skin color, texture, turgor normal. No rashes or lesions Lymph nodes: Cervical, supraclavicular, and axillary nodes normal. No abnormal inguinal nodes palpated Neurologic: Grossly normal   Pelvic: External genitalia:  no lesions              Urethra:  normal appearing urethra with no masses, tenderness or lesions              Bartholins and Skenes: normal                 Vagina: normal appearing vagina with normal color and no discharge, no lesions              Cervix: no lesions              Pap taken: Yes.   Bimanual Exam:  Uterus:   normal size, contour, position, consistency, mobility, non-tender              Adnexa: normal adnexa and no mass, fullness, tenderness               Rectovaginal: deferred               Anus: no lesions  Chaperone, Octaviano Batty, CMA, was present for exam.  Assessment/Plan: 1. Well woman exam with routine gynecological exam - Pap smear and HR HPV obtained today - Mammogram 08/2021 - Colonoscopy referral has been made - Bone mineral density guidelines reviewed - lab work done with PCP, Dr. Dorthy Cooler - vaccines reviewed/updated  2. Cervical cancer screening - Cytology - PAP( [place])  3. Morbid obesity ([place])  4. Malignant melanoma of right upper arm ([place]) - followed by dermatology  5. Family history of malignant neoplasm of gastrointestinal tract - has been referred for colonoscopy

## 2022-06-11 LAB — CYTOLOGY - PAP
Comment: NEGATIVE
Diagnosis: NEGATIVE
High risk HPV: NEGATIVE

## 2022-06-21 ENCOUNTER — Encounter: Payer: Self-pay | Admitting: *Deleted

## 2022-06-23 DIAGNOSIS — F331 Major depressive disorder, recurrent, moderate: Secondary | ICD-10-CM | POA: Diagnosis not present

## 2022-07-07 DIAGNOSIS — F331 Major depressive disorder, recurrent, moderate: Secondary | ICD-10-CM | POA: Diagnosis not present

## 2022-07-09 ENCOUNTER — Encounter: Payer: Self-pay | Admitting: Orthopedic Surgery

## 2022-07-09 ENCOUNTER — Ambulatory Visit: Payer: BC Managed Care – PPO | Admitting: Orthopedic Surgery

## 2022-07-09 ENCOUNTER — Telehealth: Payer: Self-pay

## 2022-07-09 DIAGNOSIS — M1712 Unilateral primary osteoarthritis, left knee: Secondary | ICD-10-CM

## 2022-07-09 DIAGNOSIS — M170 Bilateral primary osteoarthritis of knee: Secondary | ICD-10-CM | POA: Diagnosis not present

## 2022-07-09 NOTE — Telephone Encounter (Signed)
Auth needed for gel injections

## 2022-07-09 NOTE — Telephone Encounter (Signed)
VOB submitted for Orthovisc, bilateral knee 

## 2022-07-09 NOTE — Progress Notes (Signed)
Office Visit Note   Patient: Dominique Mcpherson           Date of Birth: 1966-02-09           MRN: [id number] Visit Date: 07/09/2022 Requested by: Lujean Amel, MD [address],  [place] [id number] PCP: Lujean Amel, MD  Subjective: Chief Complaint  Patient presents with   Left Knee - Pain    HPI: Dominique Mcpherson is a 57 y.o. female who presents to the office reporting left knee pain.  She has a known history of severe left knee arthritis.  She does get reasonable relief from cortisone injections.  Patient denies any interval history of trauma or injury.                ROS: All systems reviewed are negative as they relate to the chief complaint within the history of present illness.  Patient denies fevers or chills.  Assessment & Plan: Visit Diagnoses:  1. Primary osteoarthritis of knees, bilateral     Plan: Impression is left knee arthritis.  Cortisone injection performed today.  Her arthritis is severe.  Injections becoming slightly more difficult due to osteophyte and bone overgrowth.  Follow-up as needed.  Plan to preapprove patient for gel injection at next appointment.  This patient is diagnosed with osteoarthritis of the knee(s).    Radiographs show evidence of joint space narrowing, osteophytes, subchondral sclerosis and/or subchondral cysts.  This patient has knee pain which interferes with functional and activities of daily living.    This patient has experienced inadequate response, adverse effects and/or intolerance with conservative treatments such as acetaminophen, NSAIDS, topical creams, physical therapy or regular exercise, knee bracing and/or weight loss.   This patient has experienced inadequate response or has a contraindication to intra articular steroid injections for at least 3 months.   This patient is not scheduled to have a total knee replacement within 6 months of starting treatment with viscosupplementation.   Follow-Up  Instructions: No follow-ups on file.   Orders:  No orders of the defined types were placed in this encounter.  No orders of the defined types were placed in this encounter.     Procedures: Large Joint Inj: L knee on 07/09/2022 7:20 PM Indications: diagnostic evaluation, joint swelling and pain Details: 18 G 1.5 in needle, superolateral approach  Arthrogram: No  Medications: 5 mL lidocaine 1 %; 40 mg methylPREDNISolone acetate 40 MG/ML; 4 mL bupivacaine 0.25 % Outcome: tolerated well, no immediate complications Procedure, treatment alternatives, risks and benefits explained, specific risks discussed. Consent was given by the patient. Immediately prior to procedure a time out was called to verify the correct patient, procedure, equipment, support staff and site/side marked as required. Patient was prepped and draped in the usual sterile fashion.      Clinical Data: No additional findings.  Objective: Vital Signs: LMP 07/01/2017 (Approximate)   Physical Exam:  Constitutional: Patient appears well-developed HEENT:  Head: Normocephalic Eyes:EOM are normal Neck: Normal range of motion Cardiovascular: Normal rate Pulmonary/chest: Effort normal Neurologic: Patient is alert Skin: Skin is warm Psychiatric: Patient has normal mood and affect  Ortho Exam: Ortho exam demonstrates left knee with intact extensor mechanism.  No cellulitis noted about the knee.  No discernible effusion.  No ecchymosis   Specialty Comments:  No specialty comments available.  Imaging: No results found.   PMFS History: Patient Active Problem List   Diagnosis Date Noted   Acquired hypothyroidism 10/24/2020   Allergic  rhinitis 10/24/2020   Amnesia 10/24/2020   Arthritis of hip 10/24/2020   Chronic pain 10/24/2020   Family history of malignant neoplasm of gastrointestinal tract 10/24/2020   Gastro-esophageal reflux disease without esophagitis 10/24/2020   Localized, primary osteoarthritis of hand  10/24/2020   Meralgia paresthetica 10/24/2020   Moderate major depression ([place]) 10/24/2020   Scapulalgia 10/24/2020   Pain in right hip 10/24/2020   Vitamin D deficiency 10/24/2020   OA (osteoarthritis) of knee 07/01/2020   Knee pain, left 07/01/2020   Left elbow pain 06/11/2016   Achilles tendon pain 06/11/2016   Melanoma of upper arm ([place]) 05/09/2013   Morbid obesity ([place]) 11/29/2012   Past Medical History:  Diagnosis Date   Acid reflux    Anxiety    Depression    Hypothyroidism    Knee osteoarthritis    with degenerative medial meniscus tear   Meniscus tear    Osteoarthritis     Family History  Problem Relation Age of Onset   Cancer Paternal Grandmother        colon   Liver cancer Paternal Grandmother    Heart disease Maternal Grandmother    Stroke Mother    Hypertension Mother    Hypertension Brother    Breast cancer Neg Hx     Past Surgical History:  Procedure Laterality Date   CHOLECYSTECTOMY     FOOT SURGERY     bilateral plantar fascitis   HYSTEROSCOPY WITH D & C N/A 01/31/2013   Procedure: DILATATION AND CURETTAGE  TRU CLEAR /HYSTEROSCOPY;  Surgeon: Azalia Bilis, MD;  Location: [facility];  Service: Gynecology;  Laterality: N/A;   MELANOMA EXCISION Right 05/17/2013   Procedure: WIDE EXCISION MELANOMA RIGHT UPPER ARM;  Surgeon: Harl Bowie, MD;  Location: [facility];  Service: General;  Laterality: Right;   TONSILLECTOMY     UPPER GI ENDOSCOPY  3/15   Social History   Occupational History   Not on file  Tobacco Use   Smoking status: Never   Smokeless tobacco: Never  Vaping Use   Vaping Use: Never used  Substance and Sexual Activity   Alcohol use: Yes    Alcohol/week: 2.0 standard drinks of alcohol    Types: 2 Standard drinks or equivalent per week    Comment: occ   Drug use: No   Sexual activity: Never    Partners: Male    Birth control/protection: Abstinence

## 2022-07-10 ENCOUNTER — Encounter: Payer: Self-pay | Admitting: Orthopedic Surgery

## 2022-07-14 ENCOUNTER — Telehealth: Payer: Self-pay

## 2022-07-14 DIAGNOSIS — F331 Major depressive disorder, recurrent, moderate: Secondary | ICD-10-CM | POA: Diagnosis not present

## 2022-07-14 NOTE — Telephone Encounter (Signed)
PA completed online through [employer] for Orthovisc, bilateral knee Key: [id number] PA [id number]# [id number]

## 2022-07-17 MED ORDER — METHYLPREDNISOLONE ACETATE 40 MG/ML IJ SUSP
40.0000 mg | INTRAMUSCULAR | Status: AC | PRN
  Administered 2022-07-09: 40 mg via INTRA_ARTICULAR

## 2022-07-17 MED ORDER — BUPIVACAINE HCL 0.25 % IJ SOLN
4.0000 mL | INTRAMUSCULAR | Status: AC | PRN
  Administered 2022-07-09: 4 mL via INTRA_ARTICULAR

## 2022-07-17 MED ORDER — LIDOCAINE HCL 1 % IJ SOLN
5.0000 mL | INTRAMUSCULAR | Status: AC | PRN
  Administered 2022-07-09: 5 mL

## 2022-07-21 ENCOUNTER — Other Ambulatory Visit: Payer: Self-pay

## 2022-07-21 ENCOUNTER — Telehealth: Payer: Self-pay

## 2022-07-21 DIAGNOSIS — F331 Major depressive disorder, recurrent, moderate: Secondary | ICD-10-CM | POA: Diagnosis not present

## 2022-07-21 DIAGNOSIS — M170 Bilateral primary osteoarthritis of knee: Secondary | ICD-10-CM

## 2022-07-21 NOTE — Telephone Encounter (Signed)
Called and left a VM for patient to CB and schedule for gel injection with Dr. Marlou Sa or Lurena Joiner.  See referral tab

## 2022-07-26 ENCOUNTER — Ambulatory Visit: Payer: BC Managed Care – PPO | Admitting: Orthopedic Surgery

## 2022-07-28 ENCOUNTER — Ambulatory Visit: Payer: BC Managed Care – PPO | Admitting: Orthopedic Surgery

## 2022-08-04 DIAGNOSIS — F331 Major depressive disorder, recurrent, moderate: Secondary | ICD-10-CM | POA: Diagnosis not present

## 2022-08-11 DIAGNOSIS — F331 Major depressive disorder, recurrent, moderate: Secondary | ICD-10-CM | POA: Diagnosis not present

## 2022-08-13 ENCOUNTER — Ambulatory Visit: Payer: BC Managed Care – PPO | Admitting: Orthopedic Surgery

## 2022-08-18 DIAGNOSIS — F331 Major depressive disorder, recurrent, moderate: Secondary | ICD-10-CM | POA: Diagnosis not present

## 2022-08-19 ENCOUNTER — Ambulatory Visit: Payer: BC Managed Care – PPO | Admitting: Orthopedic Surgery

## 2022-08-19 ENCOUNTER — Encounter: Payer: Self-pay | Admitting: Orthopedic Surgery

## 2022-08-19 DIAGNOSIS — M1711 Unilateral primary osteoarthritis, right knee: Secondary | ICD-10-CM

## 2022-08-19 DIAGNOSIS — M1712 Unilateral primary osteoarthritis, left knee: Secondary | ICD-10-CM | POA: Diagnosis not present

## 2022-08-19 DIAGNOSIS — M170 Bilateral primary osteoarthritis of knee: Secondary | ICD-10-CM

## 2022-08-19 MED ORDER — HYALURONAN 30 MG/2ML IX SOSY
30.0000 mg | PREFILLED_SYRINGE | INTRA_ARTICULAR | Status: AC | PRN
  Administered 2022-08-19: 30 mg via INTRA_ARTICULAR

## 2022-08-19 MED ORDER — LIDOCAINE HCL 1 % IJ SOLN
5.0000 mL | INTRAMUSCULAR | Status: AC | PRN
  Administered 2022-08-19: 5 mL

## 2022-08-19 NOTE — Progress Notes (Signed)
   Procedure Note  Patient: Dominique Mcpherson             Date of Birth: [birth year]           MRN: [id number]             Visit Date: 08/19/2022  Procedures: Visit Diagnoses:  1. Primary osteoarthritis of knees, bilateral     Large Joint Inj: R knee on 08/19/2022 9:52 PM Indications: diagnostic evaluation, joint swelling and pain Details: 18 G 1.5 in needle, superolateral approach  Arthrogram: No  Medications: 5 mL lidocaine 1 %; 30 mg Hyaluronan 30 MG/2ML Outcome: tolerated well, no immediate complications Procedure, treatment alternatives, risks and benefits explained, specific risks discussed. Consent was given by the patient. Immediately prior to procedure a time out was called to verify the correct patient, procedure, equipment, support staff and site/side marked as required. Patient was prepped and draped in the usual sterile fashion.    Large Joint Inj: L knee on 08/19/2022 9:52 PM Indications: diagnostic evaluation, joint swelling and pain Details: 18 G 1.5 in needle, superolateral approach  Arthrogram: No  Medications: 5 mL lidocaine 1 %; 30 mg Hyaluronan 30 MG/2ML Outcome: tolerated well, no immediate complications Procedure, treatment alternatives, risks and benefits explained, specific risks discussed. Consent was given by the patient. Immediately prior to procedure a time out was called to verify the correct patient, procedure, equipment, support staff and site/side marked as required. Patient was prepped and draped in the usual sterile fashion.

## 2022-08-27 ENCOUNTER — Encounter: Payer: Self-pay | Admitting: Orthopedic Surgery

## 2022-08-27 ENCOUNTER — Ambulatory Visit: Payer: BC Managed Care – PPO | Admitting: Orthopedic Surgery

## 2022-08-27 DIAGNOSIS — M1711 Unilateral primary osteoarthritis, right knee: Secondary | ICD-10-CM

## 2022-08-27 DIAGNOSIS — M170 Bilateral primary osteoarthritis of knee: Secondary | ICD-10-CM

## 2022-08-27 DIAGNOSIS — M1712 Unilateral primary osteoarthritis, left knee: Secondary | ICD-10-CM | POA: Diagnosis not present

## 2022-08-27 MED ORDER — HYALURONAN 30 MG/2ML IX SOSY
30.0000 mg | PREFILLED_SYRINGE | INTRA_ARTICULAR | Status: AC | PRN
  Administered 2022-08-27: 30 mg via INTRA_ARTICULAR

## 2022-08-27 MED ORDER — LIDOCAINE HCL 1 % IJ SOLN
5.0000 mL | INTRAMUSCULAR | Status: AC | PRN
  Administered 2022-08-27: 5 mL

## 2022-08-27 NOTE — Progress Notes (Signed)
   Procedure Note  Patient: Dominique Mcpherson             Date of Birth: 1965-11-21           MRN: [id number]             Visit Date: 08/27/2022  Procedures: Visit Diagnoses:  1. Primary osteoarthritis of knees, bilateral     Large Joint Inj: R knee on 08/27/2022 10:48 PM Indications: diagnostic evaluation, joint swelling and pain Details: 18 G 1.5 in needle, superolateral approach  Arthrogram: No  Medications: 5 mL lidocaine 1 %; 30 mg Hyaluronan 30 MG/2ML Outcome: tolerated well, no immediate complications Procedure, treatment alternatives, risks and benefits explained, specific risks discussed. Consent was given by the patient. Immediately prior to procedure a time out was called to verify the correct patient, procedure, equipment, support staff and site/side marked as required. Patient was prepped and draped in the usual sterile fashion.    Large Joint Inj: L knee on 08/27/2022 10:49 PM Indications: diagnostic evaluation, joint swelling and pain Details: 18 G 1.5 in needle, superolateral approach  Arthrogram: No  Medications: 5 mL lidocaine 1 %; 30 mg Hyaluronan 30 MG/2ML Outcome: tolerated well, no immediate complications Procedure, treatment alternatives, risks and benefits explained, specific risks discussed. Consent was given by the patient. Immediately prior to procedure a time out was called to verify the correct patient, procedure, equipment, support staff and site/side marked as required. Patient was prepped and draped in the usual sterile fashion.     Lot #[id number]

## 2022-09-01 ENCOUNTER — Ambulatory Visit: Payer: BC Managed Care – PPO | Admitting: Orthopedic Surgery

## 2022-09-01 DIAGNOSIS — F331 Major depressive disorder, recurrent, moderate: Secondary | ICD-10-CM | POA: Diagnosis not present

## 2022-09-06 ENCOUNTER — Ambulatory Visit: Payer: BC Managed Care – PPO | Admitting: Orthopedic Surgery

## 2022-09-15 ENCOUNTER — Ambulatory Visit: Payer: BC Managed Care – PPO | Admitting: Orthopedic Surgery

## 2022-09-15 ENCOUNTER — Encounter: Payer: Self-pay | Admitting: Orthopedic Surgery

## 2022-09-15 DIAGNOSIS — M170 Bilateral primary osteoarthritis of knee: Secondary | ICD-10-CM | POA: Diagnosis not present

## 2022-09-15 DIAGNOSIS — M1711 Unilateral primary osteoarthritis, right knee: Secondary | ICD-10-CM | POA: Diagnosis not present

## 2022-09-15 DIAGNOSIS — F331 Major depressive disorder, recurrent, moderate: Secondary | ICD-10-CM | POA: Diagnosis not present

## 2022-09-15 DIAGNOSIS — M1712 Unilateral primary osteoarthritis, left knee: Secondary | ICD-10-CM | POA: Diagnosis not present

## 2022-09-15 MED ORDER — LIDOCAINE HCL 1 % IJ SOLN
5.0000 mL | INTRAMUSCULAR | Status: AC | PRN
  Administered 2022-09-15: 5 mL

## 2022-09-15 MED ORDER — HYALURONAN 30 MG/2ML IX SOSY
30.0000 mg | PREFILLED_SYRINGE | INTRA_ARTICULAR | Status: AC | PRN
  Administered 2022-09-15: 30 mg via INTRA_ARTICULAR

## 2022-09-15 NOTE — Progress Notes (Signed)
   Procedure Note  Patient: Dominique Mcpherson             Date of Birth: [birth year]           MRN: [id number]             Visit Date: 09/15/2022  Procedures: Visit Diagnoses: No diagnosis found.  Large Joint Inj: R knee on 09/15/2022 7:24 PM Indications: diagnostic evaluation, joint swelling and pain Details: 18 G 1.5 in needle, superolateral approach  Arthrogram: No  Medications: 5 mL lidocaine 1 %; 30 mg Hyaluronan 30 MG/2ML Outcome: tolerated well, no immediate complications Procedure, treatment alternatives, risks and benefits explained, specific risks discussed. Consent was given by the patient. Immediately prior to procedure a time out was called to verify the correct patient, procedure, equipment, support staff and site/side marked as required. Patient was prepped and draped in the usual sterile fashion.    Large Joint Inj: L knee on 09/15/2022 7:24 PM Indications: diagnostic evaluation, joint swelling and pain Details: 18 G 1.5 in needle, superolateral approach  Arthrogram: No  Medications: 5 mL lidocaine 1 %; 30 mg Hyaluronan 30 MG/2ML Outcome: tolerated well, no immediate complications Procedure, treatment alternatives, risks and benefits explained, specific risks discussed. Consent was given by the patient. Immediately prior to procedure a time out was called to verify the correct patient, procedure, equipment, support staff and site/side marked as required. Patient was prepped and draped in the usual sterile fashion.

## 2022-09-16 ENCOUNTER — Encounter (HOSPITAL_BASED_OUTPATIENT_CLINIC_OR_DEPARTMENT_OTHER): Payer: Self-pay | Admitting: *Deleted

## 2022-09-24 DIAGNOSIS — F902 Attention-deficit hyperactivity disorder, combined type: Secondary | ICD-10-CM | POA: Diagnosis not present

## 2022-09-24 DIAGNOSIS — F338 Other recurrent depressive disorders: Secondary | ICD-10-CM | POA: Diagnosis not present

## 2022-09-24 DIAGNOSIS — Z79899 Other long term (current) drug therapy: Secondary | ICD-10-CM | POA: Diagnosis not present

## 2022-10-06 DIAGNOSIS — F331 Major depressive disorder, recurrent, moderate: Secondary | ICD-10-CM | POA: Diagnosis not present

## 2022-10-11 DIAGNOSIS — S60862A Insect bite (nonvenomous) of left wrist, initial encounter: Secondary | ICD-10-CM | POA: Diagnosis not present

## 2022-10-13 ENCOUNTER — Encounter: Payer: Self-pay | Admitting: Orthopedic Surgery

## 2022-10-13 DIAGNOSIS — F331 Major depressive disorder, recurrent, moderate: Secondary | ICD-10-CM | POA: Diagnosis not present

## 2022-10-14 NOTE — Telephone Encounter (Signed)
Okay for 5-year handicap sticker no problem for that.  Thank you

## 2022-10-15 DIAGNOSIS — D539 Nutritional anemia, unspecified: Secondary | ICD-10-CM | POA: Diagnosis not present

## 2022-10-15 DIAGNOSIS — Z79899 Other long term (current) drug therapy: Secondary | ICD-10-CM | POA: Diagnosis not present

## 2022-10-15 DIAGNOSIS — N914 Secondary oligomenorrhea: Secondary | ICD-10-CM | POA: Diagnosis not present

## 2022-10-15 DIAGNOSIS — R5383 Other fatigue: Secondary | ICD-10-CM | POA: Diagnosis not present

## 2022-10-15 DIAGNOSIS — Z6841 Body Mass Index (BMI) 40.0 and over, adult: Secondary | ICD-10-CM | POA: Diagnosis not present

## 2022-10-15 DIAGNOSIS — E7800 Pure hypercholesterolemia, unspecified: Secondary | ICD-10-CM | POA: Diagnosis not present

## 2022-10-15 DIAGNOSIS — E88819 Insulin resistance, unspecified: Secondary | ICD-10-CM | POA: Diagnosis not present

## 2022-10-15 DIAGNOSIS — E559 Vitamin D deficiency, unspecified: Secondary | ICD-10-CM | POA: Diagnosis not present

## 2022-10-15 DIAGNOSIS — Z1331 Encounter for screening for depression: Secondary | ICD-10-CM | POA: Diagnosis not present

## 2022-10-15 DIAGNOSIS — R635 Abnormal weight gain: Secondary | ICD-10-CM | POA: Diagnosis not present

## 2022-10-15 DIAGNOSIS — R0602 Shortness of breath: Secondary | ICD-10-CM | POA: Diagnosis not present

## 2022-10-27 DIAGNOSIS — F331 Major depressive disorder, recurrent, moderate: Secondary | ICD-10-CM | POA: Diagnosis not present

## 2022-11-10 DIAGNOSIS — F331 Major depressive disorder, recurrent, moderate: Secondary | ICD-10-CM | POA: Diagnosis not present

## 2022-11-17 DIAGNOSIS — F331 Major depressive disorder, recurrent, moderate: Secondary | ICD-10-CM | POA: Diagnosis not present

## 2022-11-19 DIAGNOSIS — Z6841 Body Mass Index (BMI) 40.0 and over, adult: Secondary | ICD-10-CM | POA: Diagnosis not present

## 2022-11-19 DIAGNOSIS — R635 Abnormal weight gain: Secondary | ICD-10-CM | POA: Diagnosis not present

## 2022-11-19 DIAGNOSIS — N914 Secondary oligomenorrhea: Secondary | ICD-10-CM | POA: Diagnosis not present

## 2022-11-22 ENCOUNTER — Encounter (HOSPITAL_BASED_OUTPATIENT_CLINIC_OR_DEPARTMENT_OTHER): Payer: Self-pay | Admitting: Obstetrics & Gynecology

## 2022-11-24 DIAGNOSIS — F331 Major depressive disorder, recurrent, moderate: Secondary | ICD-10-CM | POA: Diagnosis not present

## 2022-12-01 DIAGNOSIS — F331 Major depressive disorder, recurrent, moderate: Secondary | ICD-10-CM | POA: Diagnosis not present

## 2022-12-07 ENCOUNTER — Other Ambulatory Visit: Payer: Self-pay | Admitting: Family Medicine

## 2022-12-07 DIAGNOSIS — Z1231 Encounter for screening mammogram for malignant neoplasm of breast: Secondary | ICD-10-CM

## 2022-12-08 DIAGNOSIS — F331 Major depressive disorder, recurrent, moderate: Secondary | ICD-10-CM | POA: Diagnosis not present

## 2022-12-15 DIAGNOSIS — Z1231 Encounter for screening mammogram for malignant neoplasm of breast: Secondary | ICD-10-CM

## 2022-12-29 DIAGNOSIS — F902 Attention-deficit hyperactivity disorder, combined type: Secondary | ICD-10-CM | POA: Diagnosis not present

## 2022-12-29 DIAGNOSIS — Z79899 Other long term (current) drug therapy: Secondary | ICD-10-CM | POA: Diagnosis not present

## 2022-12-30 DIAGNOSIS — F902 Attention-deficit hyperactivity disorder, combined type: Secondary | ICD-10-CM | POA: Diagnosis not present

## 2023-01-03 DIAGNOSIS — Z6841 Body Mass Index (BMI) 40.0 and over, adult: Secondary | ICD-10-CM | POA: Diagnosis not present

## 2023-01-03 DIAGNOSIS — N914 Secondary oligomenorrhea: Secondary | ICD-10-CM | POA: Diagnosis not present

## 2023-01-03 DIAGNOSIS — R030 Elevated blood-pressure reading, without diagnosis of hypertension: Secondary | ICD-10-CM | POA: Diagnosis not present

## 2023-01-03 DIAGNOSIS — R635 Abnormal weight gain: Secondary | ICD-10-CM | POA: Diagnosis not present

## 2023-01-12 DIAGNOSIS — F331 Major depressive disorder, recurrent, moderate: Secondary | ICD-10-CM | POA: Diagnosis not present

## 2023-01-19 DIAGNOSIS — F331 Major depressive disorder, recurrent, moderate: Secondary | ICD-10-CM | POA: Diagnosis not present

## 2023-02-02 DIAGNOSIS — F331 Major depressive disorder, recurrent, moderate: Secondary | ICD-10-CM | POA: Diagnosis not present

## 2023-02-09 DIAGNOSIS — F331 Major depressive disorder, recurrent, moderate: Secondary | ICD-10-CM | POA: Diagnosis not present

## 2023-02-19 DIAGNOSIS — R030 Elevated blood-pressure reading, without diagnosis of hypertension: Secondary | ICD-10-CM | POA: Diagnosis not present

## 2023-02-19 DIAGNOSIS — R635 Abnormal weight gain: Secondary | ICD-10-CM | POA: Diagnosis not present

## 2023-02-19 DIAGNOSIS — Z6841 Body Mass Index (BMI) 40.0 and over, adult: Secondary | ICD-10-CM | POA: Diagnosis not present

## 2023-02-19 DIAGNOSIS — N914 Secondary oligomenorrhea: Secondary | ICD-10-CM | POA: Diagnosis not present

## 2023-03-02 DIAGNOSIS — F331 Major depressive disorder, recurrent, moderate: Secondary | ICD-10-CM | POA: Diagnosis not present

## 2023-03-16 DIAGNOSIS — F331 Major depressive disorder, recurrent, moderate: Secondary | ICD-10-CM | POA: Diagnosis not present

## 2023-03-23 DIAGNOSIS — F331 Major depressive disorder, recurrent, moderate: Secondary | ICD-10-CM | POA: Diagnosis not present

## 2023-03-28 DIAGNOSIS — Z0000 Encounter for general adult medical examination without abnormal findings: Secondary | ICD-10-CM | POA: Diagnosis not present

## 2023-03-28 DIAGNOSIS — Z23 Encounter for immunization: Secondary | ICD-10-CM | POA: Diagnosis not present

## 2023-03-28 DIAGNOSIS — Z79899 Other long term (current) drug therapy: Secondary | ICD-10-CM | POA: Diagnosis not present

## 2023-03-28 DIAGNOSIS — E039 Hypothyroidism, unspecified: Secondary | ICD-10-CM | POA: Diagnosis not present

## 2023-03-28 DIAGNOSIS — F902 Attention-deficit hyperactivity disorder, combined type: Secondary | ICD-10-CM | POA: Diagnosis not present

## 2023-04-06 DIAGNOSIS — F331 Major depressive disorder, recurrent, moderate: Secondary | ICD-10-CM | POA: Diagnosis not present

## 2023-04-10 ENCOUNTER — Encounter (INDEPENDENT_AMBULATORY_CARE_PROVIDER_SITE_OTHER): Payer: Self-pay

## 2023-04-10 ENCOUNTER — Encounter (HOSPITAL_BASED_OUTPATIENT_CLINIC_OR_DEPARTMENT_OTHER): Payer: Self-pay | Admitting: Obstetrics & Gynecology

## 2023-04-11 ENCOUNTER — Other Ambulatory Visit: Payer: Self-pay | Admitting: Family Medicine

## 2023-04-11 DIAGNOSIS — Z1231 Encounter for screening mammogram for malignant neoplasm of breast: Secondary | ICD-10-CM

## 2023-04-13 DIAGNOSIS — F331 Major depressive disorder, recurrent, moderate: Secondary | ICD-10-CM | POA: Diagnosis not present

## 2023-04-15 DIAGNOSIS — Z6841 Body Mass Index (BMI) 40.0 and over, adult: Secondary | ICD-10-CM | POA: Diagnosis not present

## 2023-04-15 DIAGNOSIS — N914 Secondary oligomenorrhea: Secondary | ICD-10-CM | POA: Diagnosis not present

## 2023-04-15 DIAGNOSIS — R635 Abnormal weight gain: Secondary | ICD-10-CM | POA: Diagnosis not present

## 2023-04-22 ENCOUNTER — Encounter: Payer: Self-pay | Admitting: Orthopedic Surgery

## 2023-04-22 ENCOUNTER — Ambulatory Visit: Payer: BC Managed Care – PPO | Admitting: Orthopedic Surgery

## 2023-04-22 ENCOUNTER — Telehealth: Payer: Self-pay

## 2023-04-22 DIAGNOSIS — M1712 Unilateral primary osteoarthritis, left knee: Secondary | ICD-10-CM

## 2023-04-22 DIAGNOSIS — M170 Bilateral primary osteoarthritis of knee: Secondary | ICD-10-CM

## 2023-04-22 MED ORDER — METHYLPREDNISOLONE ACETATE 40 MG/ML IJ SUSP
40.0000 mg | INTRAMUSCULAR | Status: AC | PRN
  Administered 2023-04-22: 40 mg via INTRA_ARTICULAR

## 2023-04-22 MED ORDER — LIDOCAINE HCL 1 % IJ SOLN
5.0000 mL | INTRAMUSCULAR | Status: AC | PRN
  Administered 2023-04-22: 5 mL

## 2023-04-22 MED ORDER — BUPIVACAINE HCL 0.25 % IJ SOLN
4.0000 mL | INTRAMUSCULAR | Status: AC | PRN
  Administered 2023-04-22: 4 mL via INTRA_ARTICULAR

## 2023-04-22 NOTE — Telephone Encounter (Signed)
Auth needed for bilat knee gel  

## 2023-04-22 NOTE — Progress Notes (Signed)
Office Visit Note   Patient: Dominique Mcpherson           Date of Birth: [birth year]           MRN: [id number] Visit Date: 04/22/2023 Requested by: Darrow Bussing, MD [address] [place],  [place] [address] PCP: Darrow Bussing, MD  Subjective: Chief Complaint  Patient presents with   Left Knee - Pain    HPI: Dominique Mcpherson is a 57 y.o. female who presents to the office reporting left knee pain.  She has a history of cortisone and gel injections.  Severe end-stage arthritis of both knees it is worse on the left-hand side.  Had a period 2 weeks ago it was very difficult for her to weight-bear.  That has improved slightly..                ROS: All systems reviewed are negative as they relate to the chief complaint within the history of present illness.  Patient denies fevers or chills.  Assessment & Plan: Visit Diagnoses:  1. Primary osteoarthritis of knees, bilateral     Plan: Impression symptomatic severe arthritis in the left knee.  Cortisone injection performed today.  Preapproved for gel injection.  Follow-up pain is cortisone shot wears off.  Follow-Up Instructions: No follow-ups on file.   Orders:  No orders of the defined types were placed in this encounter.  No orders of the defined types were placed in this encounter.     Procedures: Large Joint Inj: L knee on 04/22/2023 9:40 PM Indications: diagnostic evaluation, joint swelling and pain Details: 18 G 1.5 in needle, superolateral approach  Arthrogram: No  Medications: 5 mL lidocaine 1 %; 40 mg methylPREDNISolone acetate 40 MG/ML; 4 mL bupivacaine 0.25 % Outcome: tolerated well, no immediate complications Procedure, treatment alternatives, risks and benefits explained, specific risks discussed. Consent was given by the patient. Immediately prior to procedure a time out was called to verify the correct patient, procedure, equipment, support staff and site/side marked as required. Patient was prepped and  draped in the usual sterile fashion.       Clinical Data: No additional findings.  Objective: Vital Signs: LMP 07/01/2017 (Approximate)   Physical Exam:  Constitutional: Patient appears well-developed HEENT:  Head: Normocephalic Eyes:EOM are normal Neck: Normal range of motion Cardiovascular: Normal rate Pulmonary/chest: Effort normal Neurologic: Patient is alert Skin: Skin is warm Psychiatric: Patient has normal mood and affect  Ortho Exam: Ortho exam demonstrates no effusion in that left knee.  Flexion to about 80 degrees.  Extensor mechanism intact.  No masses lymphadenopathy or skin changes noted in that knee region.  Specialty Comments:  No specialty comments available.  Imaging: No results found.   PMFS History: Patient Active Problem List   Diagnosis Date Noted   Acquired hypothyroidism 10/24/2020   Allergic rhinitis 10/24/2020   Amnesia 10/24/2020   Arthritis of hip 10/24/2020   Chronic pain 10/24/2020   Family history of malignant neoplasm of gastrointestinal tract 10/24/2020   Gastro-esophageal reflux disease without esophagitis 10/24/2020   Localized, primary osteoarthritis of hand 10/24/2020   Meralgia paresthetica 10/24/2020   Moderate major depression (HCC) 10/24/2020   Scapulalgia 10/24/2020   Pain in right hip 10/24/2020   Vitamin D deficiency 10/24/2020   OA (osteoarthritis) of knee 07/01/2020   Knee pain, left 07/01/2020   Left elbow pain 06/11/2016   Achilles tendon pain 06/11/2016   Melanoma of upper arm (HCC) 05/09/2013   Morbid obesity (HCC)  11/29/2012   Past Medical History:  Diagnosis Date   Acid reflux    Anxiety    Depression    Hypothyroidism    Knee osteoarthritis    with degenerative medial meniscus tear   Meniscus tear    Osteoarthritis     Family History  Problem Relation Age of Onset   Cancer Paternal Grandmother        colon   Liver cancer Paternal Grandmother    Heart disease Maternal Grandmother    Stroke  Mother    Hypertension Mother    Hypertension Brother    Breast cancer Neg Hx     Past Surgical History:  Procedure Laterality Date   CHOLECYSTECTOMY     FOOT SURGERY     bilateral plantar fascitis   HYSTEROSCOPY WITH D & C N/A 01/31/2013   Procedure: DILATATION AND CURETTAGE  TRU CLEAR /HYSTEROSCOPY;  Surgeon: Bennye Alm, MD;  Location: [facility];  Service: Gynecology;  Laterality: N/A;   MELANOMA EXCISION Right 05/17/2013   Procedure: WIDE EXCISION MELANOMA RIGHT UPPER ARM;  Surgeon: Shelly Rubenstein, MD;  Location: [facility];  Service: General;  Laterality: Right;   TONSILLECTOMY     UPPER GI ENDOSCOPY  3/15   Social History   Occupational History   Not on file  Tobacco Use   Smoking status: Never   Smokeless tobacco: Never  Vaping Use   Vaping status: Never Used  Substance and Sexual Activity   Alcohol use: Yes    Alcohol/week: 2.0 standard drinks of alcohol    Types: 2 Standard drinks or equivalent per week    Comment: occ   Drug use: No   Sexual activity: Never    Partners: Male    Birth control/protection: Abstinence

## 2023-04-29 DIAGNOSIS — Z131 Encounter for screening for diabetes mellitus: Secondary | ICD-10-CM | POA: Diagnosis not present

## 2023-04-29 DIAGNOSIS — Z13228 Encounter for screening for other metabolic disorders: Secondary | ICD-10-CM | POA: Diagnosis not present

## 2023-04-29 DIAGNOSIS — Z13220 Encounter for screening for lipoid disorders: Secondary | ICD-10-CM | POA: Diagnosis not present

## 2023-04-29 DIAGNOSIS — E039 Hypothyroidism, unspecified: Secondary | ICD-10-CM | POA: Diagnosis not present

## 2023-05-06 ENCOUNTER — Ambulatory Visit
Admission: RE | Admit: 2023-05-06 | Discharge: 2023-05-06 | Disposition: A | Discharge status: Home / Self Care | Payer: BC Managed Care – PPO | Source: Ambulatory Visit

## 2023-05-06 DIAGNOSIS — Z1231 Encounter for screening mammogram for malignant neoplasm of breast: Secondary | ICD-10-CM

## 2023-05-18 NOTE — Telephone Encounter (Signed)
 VOB submitted for Orthovisc, bilateral knee

## 2023-06-17 ENCOUNTER — Telehealth: Payer: BC Managed Care – PPO | Admitting: Physician Assistant

## 2023-06-17 DIAGNOSIS — T3695XA Adverse effect of unspecified systemic antibiotic, initial encounter: Secondary | ICD-10-CM

## 2023-06-17 DIAGNOSIS — B9689 Other specified bacterial agents as the cause of diseases classified elsewhere: Secondary | ICD-10-CM

## 2023-06-17 DIAGNOSIS — B379 Candidiasis, unspecified: Secondary | ICD-10-CM | POA: Diagnosis not present

## 2023-06-17 DIAGNOSIS — J329 Chronic sinusitis, unspecified: Secondary | ICD-10-CM | POA: Diagnosis not present

## 2023-06-17 MED ORDER — ONDANSETRON 4 MG PO TBDP: 4.0000 mg | ORAL_TABLET | Freq: Three times a day (TID) | ORAL | 0 refills | Status: DC | PRN

## 2023-06-17 MED ORDER — FLUTICASONE PROPIONATE 50 MCG/ACT NA SUSP: 2.0000 | Freq: Every day | NASAL | 0 refills | Status: DC

## 2023-06-17 MED ORDER — FLUCONAZOLE 150 MG PO TABS: 150.0000 mg | ORAL_TABLET | ORAL | 0 refills | Status: DC | PRN

## 2023-06-17 MED ORDER — AMOXICILLIN-POT CLAVULANATE 875-125 MG PO TABS: 1.0000 | ORAL_TABLET | Freq: Two times a day (BID) | ORAL | 0 refills | Status: DC

## 2023-06-17 NOTE — Patient Instructions (Signed)
Dominique Mcpherson, thank you for joining Margaretann Loveless, PA-C for today's virtual visit.  While this provider is not your primary care provider (PCP), if your PCP is located in our provider database this encounter information will be shared with them immediately following your visit.   A [facility] MyChart account gives you access to today's visit and all your visits, tests, and labs performed at [facility] " click here if you don't have a [facility] MyChart account or go to [email or web address]  Consent: (Patient) Dominique Mcpherson provided verbal consent for this virtual visit at the beginning of the encounter.  Current Medications:  Current Outpatient Medications:    fluconazole (DIFLUCAN) 150 MG tablet, Take 1 tablet (150 mg total) by mouth every 3 (three) days as needed., Disp: 3 tablet, Rfl: 0   acetaminophen (TYLENOL) 500 MG tablet, Take 1,000 mg by mouth as needed. , Disp: , Rfl:    amoxicillin-clavulanate (AUGMENTIN) 875-125 MG tablet, Take 1 tablet by mouth 2 (two) times daily., Disp: 14 tablet, Rfl: 0   cetirizine (ZYRTEC) 10 MG tablet, Take 10 mg by mouth as needed., Disp: , Rfl:    fluticasone (FLONASE) 50 MCG/ACT nasal spray, Place 2 sprays into both nostrils daily., Disp: 16 g, Rfl: 0   glucosamine-chondroitin 500-400 MG tablet, Take 1 tablet by mouth daily., Disp: , Rfl:    guanFACINE (INTUNIV) 4 MG TB24 ER tablet, Take 4 mg by mouth daily., Disp: , Rfl:    levothyroxine (SYNTHROID) 75 MCG tablet, 1 tablet, Disp: , Rfl:    naproxen sodium (ALEVE) 220 MG tablet, 1 tablet, Disp: , Rfl:    omeprazole (PRILOSEC) 20 MG capsule, 1 capsule, Disp: , Rfl:    ondansetron (ZOFRAN-ODT) 4 MG disintegrating tablet, Take 1 tablet (4 mg total) by mouth every 8 (eight) hours as needed., Disp: 20 tablet, Rfl: 0   Medications ordered in this encounter:  Meds ordered this encounter  Medications   amoxicillin-clavulanate (AUGMENTIN) 875-125 MG tablet    Sig: Take 1 tablet by  mouth 2 (two) times daily.    Dispense:  14 tablet    Refill:  0    Supervising Provider:   Merrilee Jansky [[id number]]   fluticasone (FLONASE) 50 MCG/ACT nasal spray    Sig: Place 2 sprays into both nostrils daily.    Dispense:  16 g    Refill:  0    Supervising Provider:   LAMPTEY, PHILIP O [[id number]]   ondansetron (ZOFRAN-ODT) 4 MG disintegrating tablet    Sig: Take 1 tablet (4 mg total) by mouth every 8 (eight) hours as needed.    Dispense:  20 tablet    Refill:  0    Supervising Provider:   Merrilee Jansky [[id number]]   fluconazole (DIFLUCAN) 150 MG tablet    Sig: Take 1 tablet (150 mg total) by mouth every 3 (three) days as needed.    Dispense:  3 tablet    Refill:  0    Supervising Provider:   Merrilee Jansky [[id number]]     *If you need refills on other medications prior to your next appointment, please contact your pharmacy*  Follow-Up: Call back or seek an in-person evaluation if the symptoms worsen or if the condition fails to improve as anticipated.  [facility] [phone]  Other Instructions Sinus Infection, Adult A sinus infection, also called sinusitis, is inflammation of your sinuses. Sinuses are hollow spaces in the bones around your face. Your sinuses are  located: Around your eyes. In the middle of your forehead. Behind your nose. In your cheekbones. Mucus normally drains out of your sinuses. When your nasal tissues become inflamed or swollen, mucus can become trapped or blocked. This allows bacteria, viruses, and fungi to grow, which leads to infection. Most infections of the sinuses are caused by a virus. A sinus infection can develop quickly. It can last for up to 4 weeks (acute) or for more than 12 weeks (chronic). A sinus infection often develops after a cold. What are the causes? This condition is caused by anything that creates swelling in the sinuses or stops mucus from draining. This includes: Allergies. Asthma. Infection from  bacteria or viruses. Deformities or blockages in your nose or sinuses. Abnormal growths in the nose (nasal polyps). Pollutants, such as chemicals or irritants in the air. Infection from fungi. This is rare. What increases the risk? You are more likely to develop this condition if you: Have a weak body defense system (immune system). Do a lot of swimming or diving. Overuse nasal sprays. Smoke. What are the signs or symptoms? The main symptoms of this condition are pain and a feeling of pressure around the affected sinuses. Other symptoms include: Stuffy nose or congestion that makes it difficult to breathe through your nose. Thick yellow or greenish drainage from your nose. Tenderness, swelling, and warmth over the affected sinuses. A cough that may get worse at night. Decreased sense of smell and taste. Extra mucus that collects in the throat or the back of the nose (postnasal drip) causing a sore throat or bad breath. Tiredness (fatigue). Fever. How is this diagnosed? This condition is diagnosed based on: Your symptoms. Your medical history. A physical exam. Tests to find out if your condition is acute or chronic. This may include: Checking your nose for nasal polyps. Viewing your sinuses using a device that has a light (endoscope). Testing for allergies or bacteria. Imaging tests, such as an MRI or CT scan. In rare cases, a bone biopsy may be done to rule out more serious types of fungal sinus disease. How is this treated? Treatment for a sinus infection depends on the cause and whether your condition is chronic or acute. If caused by a virus, your symptoms should go away on their own within 10 days. You may be given medicines to relieve symptoms. They include: Medicines that shrink swollen nasal passages (decongestants). A spray that eases inflammation of the nostrils (topical intranasal corticosteroids). Rinses that help get rid of thick mucus in your nose (nasal saline  washes). Medicines that treat allergies (antihistamines). Over-the-counter pain relievers. If caused by bacteria, your health care provider may recommend waiting to see if your symptoms improve. Most bacterial infections will get better without antibiotic medicine. You may be given antibiotics if you have: A severe infection. A weak immune system. If caused by narrow nasal passages or nasal polyps, surgery may be needed. Follow these instructions at home: Medicines Take, use, or apply over-the-counter and prescription medicines only as told by your health care provider. These may include nasal sprays. If you were prescribed an antibiotic medicine, take it as told by your health care provider. Do not stop taking the antibiotic even if you start to feel better. Hydrate and humidify  Drink enough fluid to keep your urine pale yellow. Staying hydrated will help to thin your mucus. Use a cool mist humidifier to keep the humidity level in your home above 50%. Inhale steam for 10-15 minutes, 3-4 times  a day, or as told by your health care provider. You can do this in the bathroom while a hot shower is running. Limit your exposure to cool or dry air. Rest Rest as much as possible. Sleep with your head raised (elevated). Make sure you get enough sleep each night. General instructions  Apply a warm, moist washcloth to your face 3-4 times a day or as told by your health care provider. This will help with discomfort. Use nasal saline washes as often as told by your health care provider. Wash your hands often with soap and water to reduce your exposure to germs. If soap and water are not available, use hand sanitizer. Do not smoke. Avoid being around people who are smoking (secondhand smoke). Keep all follow-up visits. This is important. Contact a health care provider if: You have a fever. Your symptoms get worse. Your symptoms do not improve within 10 days. Get help right away if: You have a  severe headache. You have persistent vomiting. You have severe pain or swelling around your face or eyes. You have vision problems. You develop confusion. Your neck is stiff. You have trouble breathing. These symptoms may be an emergency. Get help right away. Call 911. Do not wait to see if the symptoms will go away. Do not drive yourself to the hospital. Summary A sinus infection is soreness and inflammation of your sinuses. Sinuses are hollow spaces in the bones around your face. This condition is caused by nasal tissues that become inflamed or swollen. The swelling traps or blocks the flow of mucus. This allows bacteria, viruses, and fungi to grow, which leads to infection. If you were prescribed an antibiotic medicine, take it as told by your health care provider. Do not stop taking the antibiotic even if you start to feel better. Keep all follow-up visits. This is important. This information is not intended to replace advice given to you by your health care provider. Make sure you discuss any questions you have with your health care provider. Document Revised: 03/24/2021 Document Reviewed: 03/24/2021 Elsevier Patient Education  2024 Elsevier Inc.   If you have been instructed to have an in-person evaluation today at a local Urgent Care facility, please use the link below. It will take you to a list of all of our available [facility], including address, phone number and hours of operation. Please do not delay care.  [facility]  If you or a family member do not have a primary care provider, use the link below to schedule a visit and establish care. When you choose a [facility] primary care physician or advanced practice provider, you gain a long-term partner in health. Find a Primary Care Provider  Learn more about [facility] in-office and virtual care options:  - Get Care Now

## 2023-06-17 NOTE — Progress Notes (Signed)
Virtual Visit Consent   Dominique Mcpherson, you are scheduled for a virtual visit with a [facility] provider today. Just as with appointments in the office, your consent must be obtained to participate. Your consent will be active for this visit and any virtual visit you may have with one of our providers in the next 365 days. If you have a MyChart account, a copy of this consent can be sent to you electronically.  As this is a virtual visit, video technology does not allow for your provider to perform a traditional examination. This may limit your provider's ability to fully assess your condition. If your provider identifies any concerns that need to be evaluated in person or the need to arrange testing (such as labs, EKG, etc.), we will make arrangements to do so. Although advances in technology are sophisticated, we cannot ensure that it will always work on either your end or our end. If the connection with a video visit is poor, the visit may have to be switched to a telephone visit. With either a video or telephone visit, we are not always able to ensure that we have a secure connection.  By engaging in this virtual visit, you consent to the provision of healthcare and authorize for your insurance to be billed (if applicable) for the services provided during this visit. Depending on your insurance coverage, you may receive a charge related to this service.  I need to obtain your verbal consent now. Are you willing to proceed with your visit today? Dominique Mcpherson has provided verbal consent on 06/17/2023 for a virtual visit (video or telephone). Dominique Loveless, PA-C  Date: 06/17/2023 12:40 PM   Virtual Visit via Video Note   I, Dominique Mcpherson, connected with  Dominique Mcpherson  ([id number], [birth year]) on 06/17/23 at 12:30 PM EST by a video-enabled telemedicine application and verified that I am speaking with the correct person using two identifiers.  Location: Patient: Virtual Visit Location  Patient: Home Provider: Virtual Visit Location Provider: Home Office   I discussed the limitations of evaluation and management by telemedicine and the availability of in person appointments. The patient expressed understanding and agreed to proceed.    History of Present Illness: Dominique Mcpherson is a 58 y.o. who identifies as a female who was assigned female at birth, and is being seen today for post nasal drainage and nausea.  HPI: Sinusitis This is a new problem. The current episode started 1 to 4 weeks ago (May 26, 2023). The problem has been gradually worsening since onset. There has been no fever. The pain is moderate. Associated symptoms include chills, congestion, headaches and sinus pressure. Pertinent negatives include no coughing, diaphoresis, ear pain, hoarse voice or sore throat. (Post nasal drainage, nausea) Treatments tried: dayquil, nyquil, mucinex, sudafed, afrin, zyrtec, aleve, humidifier, zycam. The treatment provided no relief.     Problems:  Patient Active Problem List   Diagnosis Date Noted   Acquired hypothyroidism 10/24/2020   Allergic rhinitis 10/24/2020   Amnesia 10/24/2020   Arthritis of hip 10/24/2020   Chronic pain 10/24/2020   Family history of malignant neoplasm of gastrointestinal tract 10/24/2020   Gastro-esophageal reflux disease without esophagitis 10/24/2020   Localized, primary osteoarthritis of hand 10/24/2020   Meralgia paresthetica 10/24/2020   Moderate major depression (HCC) 10/24/2020   Scapulalgia 10/24/2020   Pain in right hip 10/24/2020   Vitamin D deficiency 10/24/2020   OA (osteoarthritis) of knee 07/01/2020   Knee pain, left  07/01/2020   Left elbow pain 06/11/2016   Achilles tendon pain 06/11/2016   Melanoma of upper arm (HCC) 05/09/2013   Morbid obesity (HCC) 11/29/2012    Allergies:  Allergies  Allergen Reactions   Bee Venom Swelling and Other (See Comments)   Medications:  Current Outpatient Medications:    fluconazole  (DIFLUCAN) 150 MG tablet, Take 1 tablet (150 mg total) by mouth every 3 (three) days as needed., Disp: 3 tablet, Rfl: 0   acetaminophen (TYLENOL) 500 MG tablet, Take 1,000 mg by mouth as needed. , Disp: , Rfl:    amoxicillin-clavulanate (AUGMENTIN) 875-125 MG tablet, Take 1 tablet by mouth 2 (two) times daily., Disp: 14 tablet, Rfl: 0   cetirizine (ZYRTEC) 10 MG tablet, Take 10 mg by mouth as needed., Disp: , Rfl:    fluticasone (FLONASE) 50 MCG/ACT nasal spray, Place 2 sprays into both nostrils daily., Disp: 16 g, Rfl: 0   glucosamine-chondroitin 500-400 MG tablet, Take 1 tablet by mouth daily., Disp: , Rfl:    guanFACINE (INTUNIV) 4 MG TB24 ER tablet, Take 4 mg by mouth daily., Disp: , Rfl:    levothyroxine (SYNTHROID) 75 MCG tablet, 1 tablet, Disp: , Rfl:    naproxen sodium (ALEVE) 220 MG tablet, 1 tablet, Disp: , Rfl:    omeprazole (PRILOSEC) 20 MG capsule, 1 capsule, Disp: , Rfl:    ondansetron (ZOFRAN-ODT) 4 MG disintegrating tablet, Take 1 tablet (4 mg total) by mouth every 8 (eight) hours as needed., Disp: 20 tablet, Rfl: 0  Observations/Objective: Patient is well-developed, well-nourished in no acute distress.  Resting comfortably at home.  Head is normocephalic, atraumatic.  No labored breathing.  Speech is clear and coherent with logical content.  Patient is alert and oriented at baseline.    Assessment and Plan: 1. Bacterial sinusitis (Primary) - amoxicillin-clavulanate (AUGMENTIN) 875-125 MG tablet; Take 1 tablet by mouth 2 (two) times daily.  Dispense: 14 tablet; Refill: 0 - fluticasone (FLONASE) 50 MCG/ACT nasal spray; Place 2 sprays into both nostrils daily.  Dispense: 16 g; Refill: 0 - ondansetron (ZOFRAN-ODT) 4 MG disintegrating tablet; Take 1 tablet (4 mg total) by mouth every 8 (eight) hours as needed.  Dispense: 20 tablet; Refill: 0  2. Antibiotic-induced yeast infection - fluconazole (DIFLUCAN) 150 MG tablet; Take 1 tablet (150 mg total) by mouth every 3 (three) days  as needed.  Dispense: 3 tablet; Refill: 0  - Worsening symptoms that have not responded to OTC medications.  - Will give Augmentin - Add flonase for sinus congestion and drainage - Zofran for nausea - Continue allergy medications.  - Steam and humidifier can help - Stay well hydrated and get plenty of rest.  - Diflucan given as prophylaxis as patient tends to get vaginal yeast infections with antibiotic use - Seek in person evaluation if no symptom improvement or if symptoms worsen   Follow Up Instructions: I discussed the assessment and treatment plan with the patient. The patient was provided an opportunity to ask questions and all were answered. The patient agreed with the plan and demonstrated an understanding of the instructions.  A copy of instructions were sent to the patient via MyChart unless otherwise noted below.    The patient was advised to call back or seek an in-person evaluation if the symptoms worsen or if the condition fails to improve as anticipated.    Dominique Loveless, PA-C

## 2023-07-01 ENCOUNTER — Ambulatory Visit (HOSPITAL_BASED_OUTPATIENT_CLINIC_OR_DEPARTMENT_OTHER): Payer: BC Managed Care – PPO | Admitting: Obstetrics & Gynecology

## 2023-07-20 ENCOUNTER — Other Ambulatory Visit: Payer: Self-pay | Admitting: Family Medicine

## 2023-07-20 ENCOUNTER — Ambulatory Visit
Admission: RE | Admit: 2023-07-20 | Discharge: 2023-07-20 | Disposition: A | Discharge status: Home / Self Care | Source: Ambulatory Visit | Attending: Family Medicine | Admitting: Family Medicine

## 2023-07-20 DIAGNOSIS — R053 Chronic cough: Secondary | ICD-10-CM

## 2023-09-07 ENCOUNTER — Telehealth: Payer: Self-pay

## 2023-09-07 ENCOUNTER — Ambulatory Visit: Admitting: Orthopedic Surgery

## 2023-09-07 ENCOUNTER — Encounter: Payer: Self-pay | Admitting: Orthopedic Surgery

## 2023-09-07 DIAGNOSIS — M1712 Unilateral primary osteoarthritis, left knee: Secondary | ICD-10-CM | POA: Diagnosis not present

## 2023-09-07 DIAGNOSIS — M170 Bilateral primary osteoarthritis of knee: Secondary | ICD-10-CM

## 2023-09-07 MED ORDER — BUPIVACAINE HCL 0.25 % IJ SOLN
4.0000 mL | INTRAMUSCULAR | Status: AC | PRN
  Administered 2023-09-07: 4 mL via INTRA_ARTICULAR

## 2023-09-07 MED ORDER — LIDOCAINE HCL 1 % IJ SOLN
5.0000 mL | INTRAMUSCULAR | Status: AC | PRN
  Administered 2023-09-07: 5 mL

## 2023-09-07 NOTE — Progress Notes (Signed)
 Office Visit Note   Patient: Dominique Mcpherson           Date of Birth: [birth year]           MRN: [id number] Visit Date: 09/07/2023 Requested by: Lanae Pinal, MD [address] [place],  [place] [address] PCP: Lanae Pinal, MD  Subjective: Chief Complaint  Patient presents with   Left Knee - Pain    HPI: Dominique Mcpherson is a 58 y.o. female who presents to the office reporting continued left knee pain.  Patient had left knee cortisone injection 04/28/2023.  Last injection did fairly well.  Patient still works.  No interval history of injury or trauma..                ROS: All systems reviewed are negative as they relate to the chief complaint within the history of present illness.  Patient denies fevers or chills.  Assessment & Plan: Visit Diagnoses:  1. Primary osteoarthritis of knees, bilateral     Plan: Impression is left knee arthritis.  Cortisone injection performed today.  We will try to get her set up for a gel injection as a neck step.  May need to do that under ultrasound guidance.  Follow-Up Instructions: No follow-ups on file.   Orders:  No orders of the defined types were placed in this encounter.  No orders of the defined types were placed in this encounter.     Procedures: Large Joint Inj on 09/07/2023 10:35 PM Indications: diagnostic evaluation, joint swelling and pain Details: 18 G 1.5 in needle, superolateral approach  Arthrogram: No  Medications: 5 mL lidocaine  1 %; 4 mL bupivacaine  0.25 % Outcome: tolerated well, no immediate complications Procedure, treatment alternatives, risks and benefits explained, specific risks discussed. Consent was given by the patient. Immediately prior to procedure a time out was called to verify the correct patient, procedure, equipment, support staff and site/side marked as required. Patient was prepped and draped in the usual sterile fashion.    Kenalog  injected   Clinical Data: No additional  findings.  Objective: Vital Signs: LMP 07/01/2017 (Approximate)   Physical Exam:  Constitutional: Patient appears well-developed HEENT:  Head: Normocephalic Eyes:EOM are normal Neck: Normal range of motion Cardiovascular: Normal rate Pulmonary/chest: Effort normal Neurologic: Patient is alert Skin: Skin is warm Psychiatric: Patient has normal mood and affect  Ortho Exam: Ortho exam demonstrates full extension as well as functional extensor mechanism.  Hard to say if there is an effusion.  She does have flexion to about 90 degrees.  Specialty Comments:  No specialty comments available.  Imaging: No results found.   PMFS History: Patient Active Problem List   Diagnosis Date Noted   Acquired hypothyroidism 10/24/2020   Allergic rhinitis 10/24/2020   Amnesia 10/24/2020   Arthritis of hip 10/24/2020   Chronic pain 10/24/2020   Family history of malignant neoplasm of gastrointestinal tract 10/24/2020   Gastro-esophageal reflux disease without esophagitis 10/24/2020   Localized, primary osteoarthritis of hand 10/24/2020   Meralgia paresthetica 10/24/2020   Moderate major depression (HCC) 10/24/2020   Scapulalgia 10/24/2020   Pain in right hip 10/24/2020   Vitamin D deficiency 10/24/2020   OA (osteoarthritis) of knee 07/01/2020   Knee pain, left 07/01/2020   Left elbow pain 06/11/2016   Achilles tendon pain 06/11/2016   Melanoma of upper arm (HCC) 05/09/2013   Morbid obesity (HCC) 11/29/2012   Past Medical History:  Diagnosis Date   Acid reflux  Anxiety    Depression    Hypothyroidism    Knee osteoarthritis    with degenerative medial meniscus tear   Meniscus tear    Osteoarthritis     Family History  Problem Relation Age of Onset   Cancer Paternal Grandmother        colon   Liver cancer Paternal Grandmother    Heart disease Maternal Grandmother    Stroke Mother    Hypertension Mother    Hypertension Brother    Breast cancer Neg Hx     Past Surgical  History:  Procedure Laterality Date   CHOLECYSTECTOMY     FOOT SURGERY     bilateral plantar fascitis   HYSTEROSCOPY WITH D & C N/A 01/31/2013   Procedure: DILATATION AND CURETTAGE  TRU CLEAR /HYSTEROSCOPY;  Surgeon: Laurent Pontes, MD;  Location: [facility];  Service: Gynecology;  Laterality: N/A;   MELANOMA EXCISION Right 05/17/2013   Procedure: WIDE EXCISION MELANOMA RIGHT UPPER ARM;  Surgeon: Rogena Class, MD;  Location: [facility];  Service: General;  Laterality: Right;   TONSILLECTOMY     UPPER GI ENDOSCOPY  3/15   Social History   Occupational History   Not on file  Tobacco Use   Smoking status: Never   Smokeless tobacco: Never  Vaping Use   Vaping status: Never Used  Substance and Sexual Activity   Alcohol use: Yes    Alcohol/week: 2.0 standard drinks of alcohol    Types: 2 Standard drinks or equivalent per week    Comment: occ   Drug use: No   Sexual activity: Never    Partners: Male    Birth control/protection: Abstinence

## 2023-09-07 NOTE — Telephone Encounter (Signed)
Auth needed for gel 

## 2023-09-15 IMAGING — MG MM DIGITAL SCREENING BILAT W/ TOMO AND CAD
8 series · 8 of 24 positions shown · non-contrast
Comparison: Previous exam(s).

ACR Breast Density Category a: The breast tissue is almost entirely
fatty.

CLINICAL DATA: Screening.

EXAM:
DIGITAL SCREENING BILATERAL MAMMOGRAM WITH TOMOSYNTHESIS AND CAD
TECHNIQUE: Bilateral screening digital craniocaudal and mediolateral oblique
mammograms were obtained. Bilateral screening digital breast
tomosynthesis was performed. The images were evaluated with
computer-aided detection.

[L CC synth-2D]
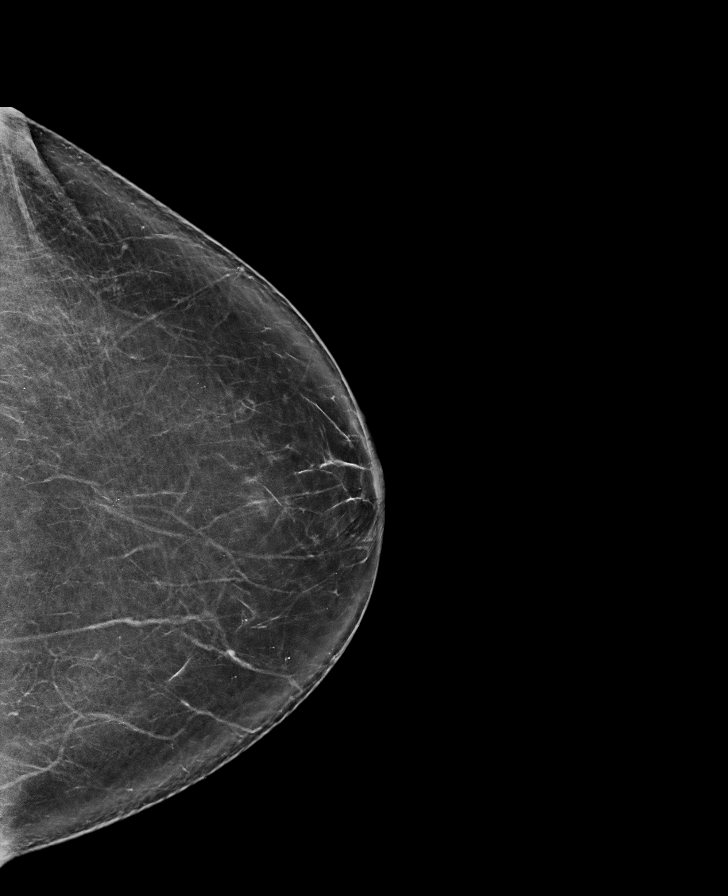

[R MLO synth-2D]
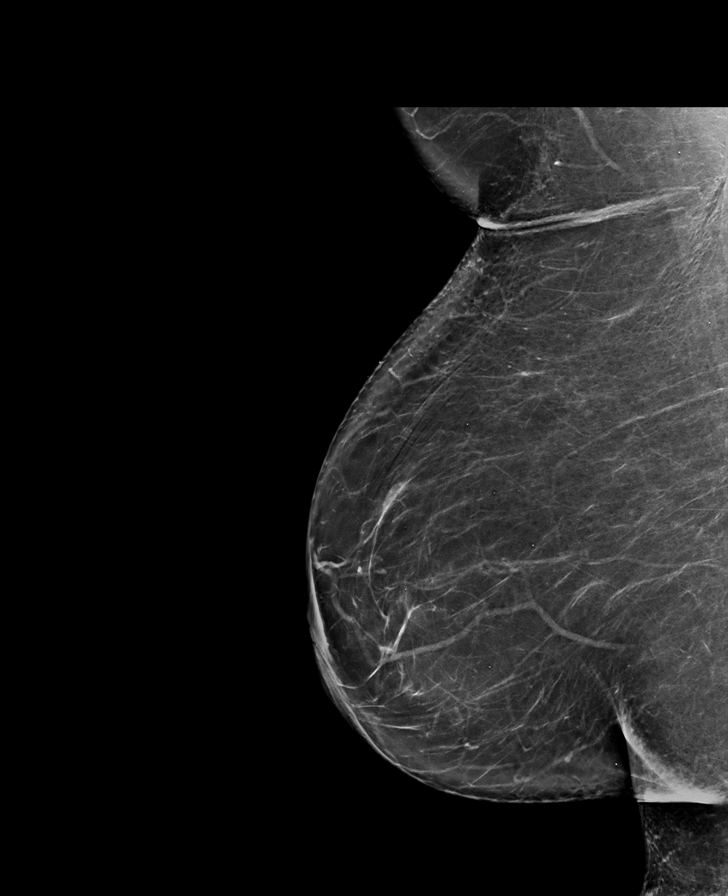

[R CC synth-2D]
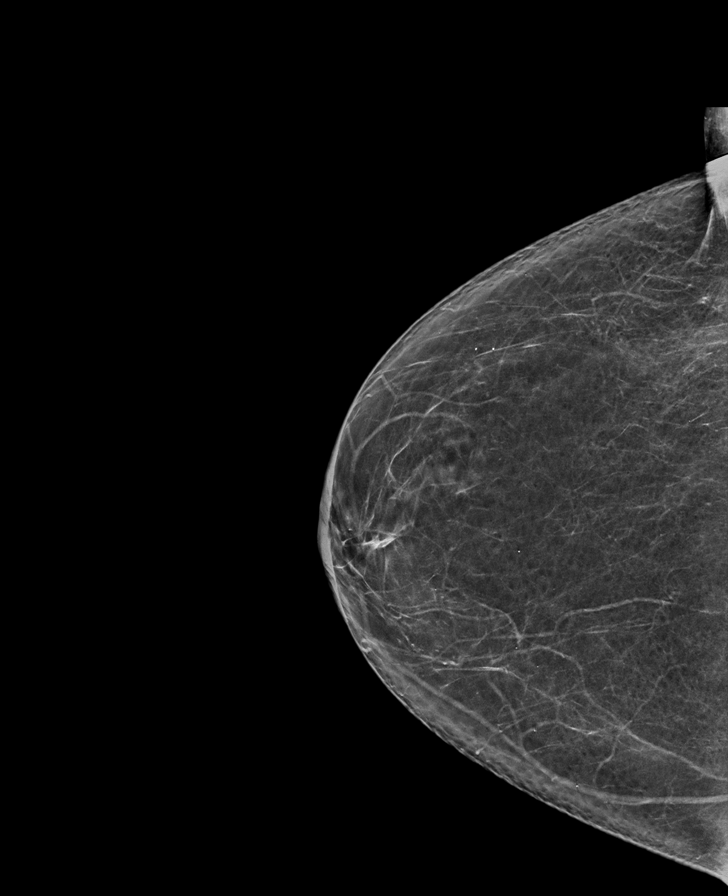

[L MLO synth-2D]
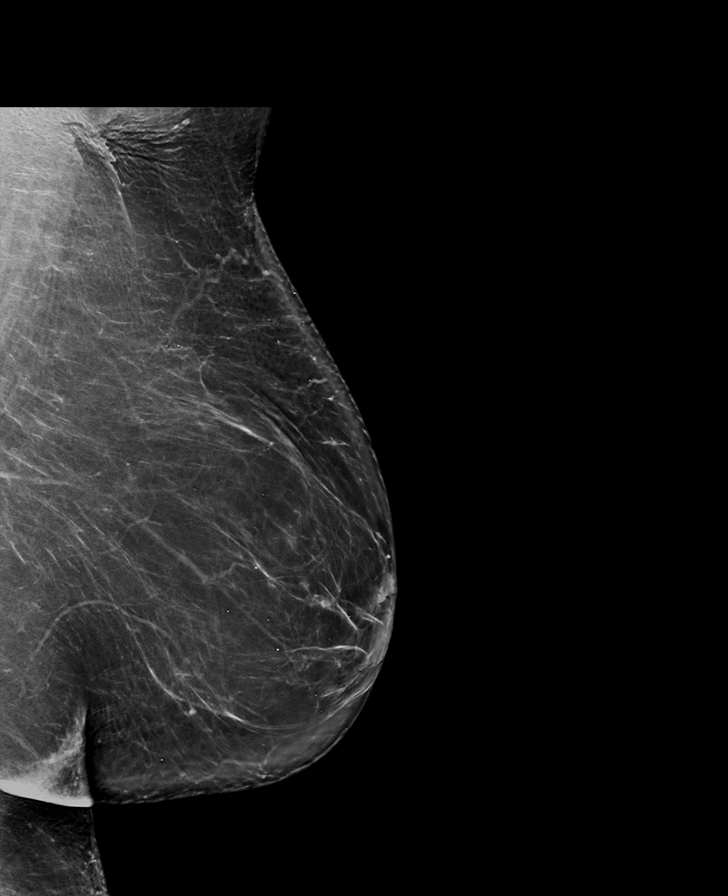

[R CC tomo · tomo slice 35/70.0]
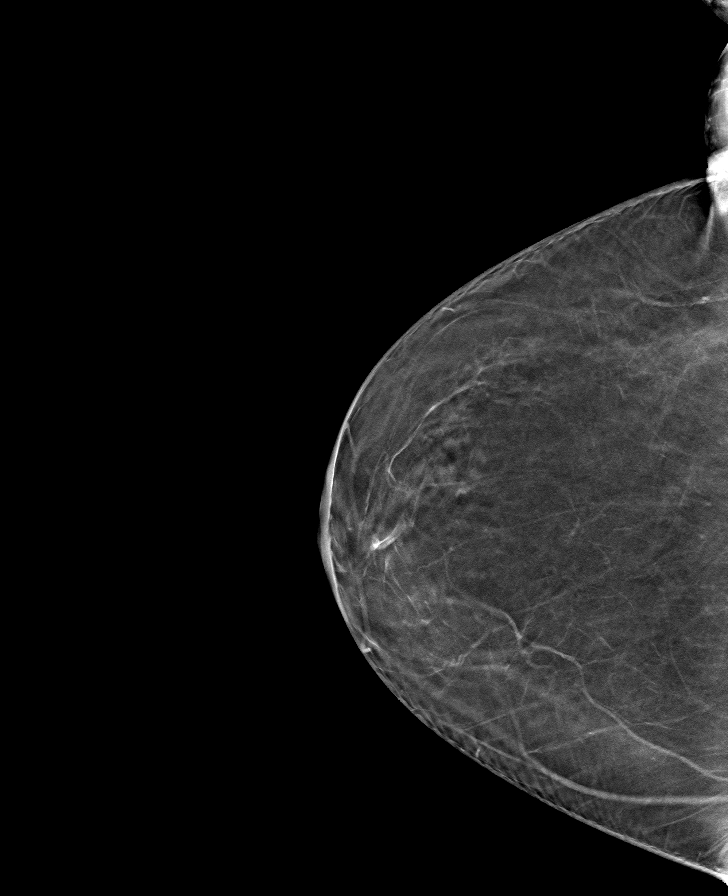

[R MLO tomo · tomo slice 42/83.0]
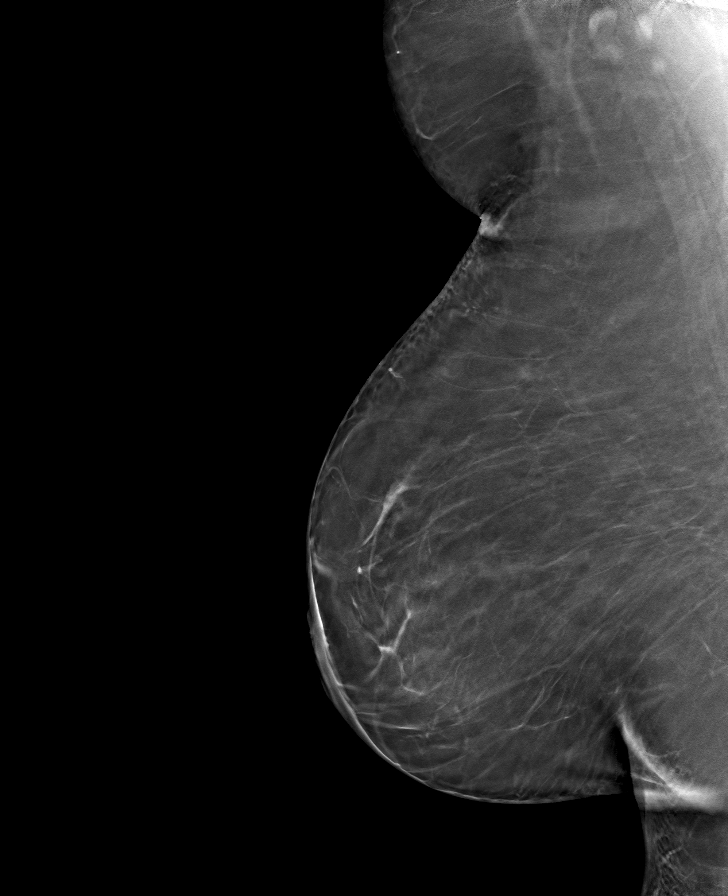

[L MLO tomo · tomo slice 45/90.0]
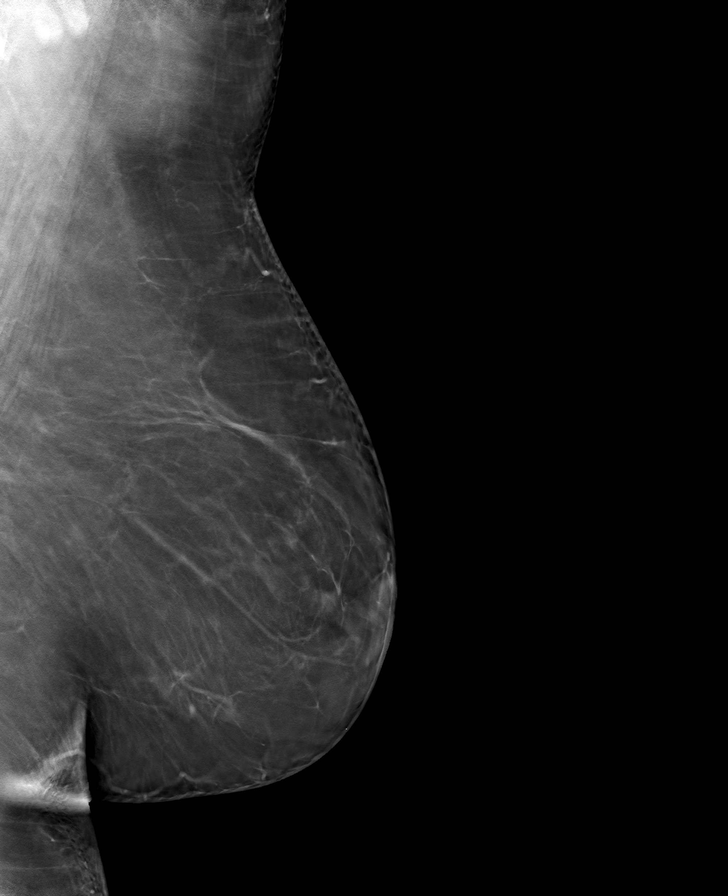

[L CC tomo · tomo slice 41/81.0]
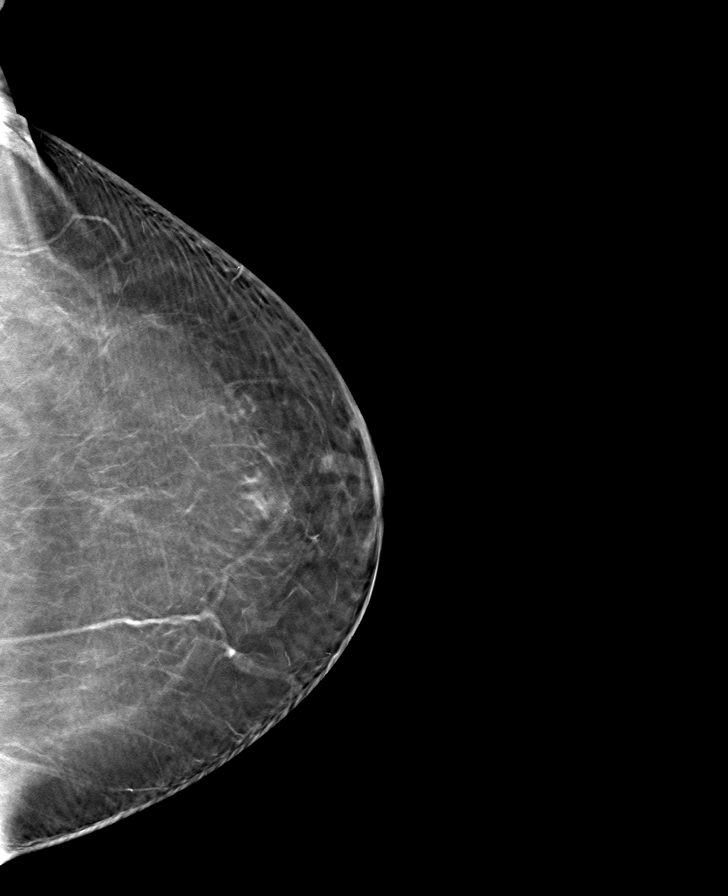

[8 of 24 positions shown; findings below may reference images not displayed]

FINDINGS: There are no findings suspicious for malignancy.
IMPRESSION: No mammographic evidence of malignancy. A result letter of this
screening mammogram will be mailed directly to the patient.

RECOMMENDATION:
Screening mammogram in one year. (Code:0E-3-N98)

BI-RADS CATEGORY  1: Negative.

## 2023-09-23 NOTE — Telephone Encounter (Signed)
 VOB had been submitted previously. Completed PA form has been faxed to [facility] at [phone] PA pending

## 2023-09-27 ENCOUNTER — Telehealth: Payer: Self-pay

## 2023-09-27 NOTE — Telephone Encounter (Signed)
 Additional information required for PA. Faxed additional information to [employer] at [phone].

## 2023-10-05 ENCOUNTER — Ambulatory Visit (HOSPITAL_BASED_OUTPATIENT_CLINIC_OR_DEPARTMENT_OTHER): Payer: BC Managed Care – PPO | Admitting: Obstetrics & Gynecology

## 2023-10-05 ENCOUNTER — Encounter (HOSPITAL_BASED_OUTPATIENT_CLINIC_OR_DEPARTMENT_OTHER): Payer: Self-pay | Admitting: Obstetrics & Gynecology

## 2023-10-05 VITALS — BP 131/90 | HR 74 | Ht 63.0 in | Wt 316.2 lb

## 2023-10-05 DIAGNOSIS — Z1211 Encounter for screening for malignant neoplasm of colon: Secondary | ICD-10-CM

## 2023-10-05 DIAGNOSIS — R350 Frequency of micturition: Secondary | ICD-10-CM

## 2023-10-05 DIAGNOSIS — Z800 Family history of malignant neoplasm of digestive organs: Secondary | ICD-10-CM

## 2023-10-05 DIAGNOSIS — C4361 Malignant melanoma of right upper limb, including shoulder: Secondary | ICD-10-CM

## 2023-10-05 DIAGNOSIS — Z01419 Encounter for gynecological examination (general) (routine) without abnormal findings: Secondary | ICD-10-CM

## 2023-10-05 LAB — POCT URINALYSIS DIPSTICK
Bilirubin, UA: NEGATIVE
Blood, UA: NEGATIVE
Glucose, UA: NEGATIVE
Ketones, UA: NEGATIVE
Nitrite, UA: NEGATIVE
Protein, UA: NEGATIVE
Spec Grav, UA: 1.02 (ref 1.010–1.025)
Urobilinogen, UA: 0.2 U/dL
pH, UA: 5.5 (ref 5.0–8.0)

## 2023-10-05 NOTE — Addendum Note (Signed)
 Addended by: Myrtie Atkinson on: 10/05/2023 03:13 PM   Modules accepted: Orders

## 2023-10-05 NOTE — Progress Notes (Signed)
   ANNUAL EXAM Patient name: Dominique Mcpherson MRN [id number]  Date of birth: [birth year] Chief Complaint:   AEX  History of Present Illness:   Dominique Mcpherson is a 58 y.o. G0P0000 Caucasian female being seen today for a routine annual exam.  Doing well.  Denies vaginal bleeding.     Patient's last menstrual period was 07/01/2017 (approximate).   Last pap  06/09/2022 . Results were: NILM w/ HRHPV negative. H/O abnormal pap: no Last mammogram: 05/06/2023  . Results were: normal. Family h/o breast cancer: no Last colonoscopy: 07/23/2013.  Follow up is due.  Will refer for pt.     10/05/2023    2:22 PM 06/09/2022    8:25 AM 06/09/2016   11:29 AM  Depression screen PHQ 2/9  Decreased Interest 0 0 0  Down, Depressed, Hopeless 0 0 0  PHQ - 2 Score 0 0 0   Review of Systems:   Pertinent items are noted in HPI Denies any bowel changes.  Increased urinary frequency the last month Pertinent History Reviewed:  Reviewed past medical,surgical, social and family history.  Reviewed problem list, medications and allergies. Physical Assessment:   Vitals:   10/05/23 1419  BP: (!) 131/90  Pulse: 74  Weight: (!) 316 lb 3.2 oz (143.4 kg)  Height: 5\' 3"  (1.6 m)  Body mass index is 56.01 kg/m.        Physical Examination:   General appearance - well appearing, and in no distress  Mental status - alert, oriented to person, place, and time  Psych:  She has a normal mood and affect  Skin - warm and dry, normal color, no suspicious lesions noted  Chest - effort normal, all lung fields clear to auscultation bilaterally  Heart - normal rate and regular rhythm  Neck:  midline trachea, no thyromegaly or nodules  Breasts - breasts appear normal, no suspicious masses, no skin or nipple changes or  axillary nodes  Abdomen - soft, nontender, nondistended, no masses or organomegaly  Pelvic - VULVA: normal appearing vulva with no masses, tenderness or lesions   VAGINA: normal appearing vagina with normal color and  discharge, no lesions   CERVIX: normal appearing cervix without discharge or lesions, no CMT  Thin prep pap not indicated today  UTERUS: uterus is felt to be normal size, shape, consistency and nontender   ADNEXA: No adnexal masses or tenderness noted.  Rectal - normal rectal, good sphincter tone, no masses felt.   Extremities:  No swelling or varicosities noted  Chaperone present for exam  Assessment & Plan:  1. Well woman exam with routine gynecological exam (Primary) - Pap smear neg 2024 - Mammogram 05/06/2023 - Colonoscopy 2015.  Overdue.  Referral placed - Bone mineral density not indicated - lab work done with PCP, Dr. Candance Certain - vaccines reviewed/updated  2. Urinary frequency - Urine Culture  3. Malignant melanoma of right upper arm (HCC) - followed at [facility]  4. Family history of malignant neoplasm of gastrointestinal tract  5. Colon cancer screening - Ambulatory referral to Gastroenterology   Orders Placed This Encounter  Procedures   Urine Culture   Ambulatory referral to Gastroenterology    Meds: No orders of the defined types were placed in this encounter.   Follow-up: Return in about 1 year (around 10/04/2024).  Lillian Rein, MD 10/05/2023 3:00 PM

## 2023-10-07 ENCOUNTER — Ambulatory Visit (HOSPITAL_BASED_OUTPATIENT_CLINIC_OR_DEPARTMENT_OTHER): Payer: Self-pay | Admitting: Obstetrics & Gynecology

## 2023-10-07 LAB — URINE CULTURE

## 2023-10-27 ENCOUNTER — Encounter: Payer: Self-pay | Admitting: Nurse Practitioner

## 2023-11-02 ENCOUNTER — Encounter: Payer: Self-pay | Admitting: Nurse Practitioner

## 2023-11-02 ENCOUNTER — Ambulatory Visit: Admitting: Nurse Practitioner

## 2023-11-02 VITALS — BP 108/64 | HR 72 | Ht 62.0 in | Wt 315.4 lb

## 2023-11-02 DIAGNOSIS — K219 Gastro-esophageal reflux disease without esophagitis: Secondary | ICD-10-CM | POA: Diagnosis not present

## 2023-11-02 DIAGNOSIS — Z1211 Encounter for screening for malignant neoplasm of colon: Secondary | ICD-10-CM

## 2023-11-02 DIAGNOSIS — Z8719 Personal history of other diseases of the digestive system: Secondary | ICD-10-CM

## 2023-11-02 DIAGNOSIS — Z800 Family history of malignant neoplasm of digestive organs: Secondary | ICD-10-CM | POA: Diagnosis not present

## 2023-11-02 MED ORDER — NA SULFATE-K SULFATE-MG SULF 17.5-3.13-1.6 GM/177ML PO SOLN: 1.0000 | Freq: Once | ORAL | 0 refills | Status: AC

## 2023-11-02 MED ORDER — FAMOTIDINE 20 MG PO TABS: 20.0000 mg | ORAL_TABLET | Freq: Every day | ORAL | 1 refills | Status: DC

## 2023-11-02 NOTE — Progress Notes (Signed)
 11/02/2023 Dominique Mcpherson [id number] 04-02-1966   CHIEF COMPLAINT: Schedule a colonoscopy and upper endoscopy  HISTORY OF PRESENT ILLNESS: Dominique Mcpherson is a 58 year old female with a past medical history of anxiety, depression, osteoarthritis, hypothyroidism and GERD. Past tonsillectomy and cholecystectomy 1991. She presents to our office today as referred by Dr. Ronal Pinal to discuss scheduling a screening colonoscopy.  She also request to schedule an EGD at the time of her colonoscopy due to having a history of GERD. She endorses having nighttime acid regurgitation with awakening in the morning with a bad taste in her mouth. She takes Omeprazole 20mg  daily and if she skips 2 days she develops active reflux symptoms. No dysphagia. She denies having any upper or lower abdominal pain. She passes a normal formed brown bowel movement daily. No rectal bleeding or black stools. She underwent a colonoscopy 07/23/2013 by a GI with [facility] in [place] which was normal. She underwent an EGD on the same date which showed fundic gland polyps otherwise was unremarkable. Paternal grandmother with history of advanced colon cancer advanced in her early 63s.   PAST GI PROCEDURES:  EGD 07/23/2013: An Olympus upper endoscope was passed, through the mouth to the level of the 2nd portion of the duodenum without difficulty. Retroflexion was performed was performed in the stomach. The views were good. The patient tolerated the procedure. No immediate complications or significant blood loss.  Findings:  Hypopharynx: normal  Esophagus: normal, no esophagitis, barrett's mucosa, strictures or other abnormalities were present.  Stomach: numerous 5mm and smaller polyps in gastric fundus and proximal body - biopsies obtained. They appear to be benign fundic gland polyps.  Duodenum: normal  Colonoscopy 07/23/2013: The patient was repositioned. A rectal exam was performed. The Olympus colonoscope was  passed, through anus to the cecum where the appendiceal orifice and ileocecal valve were identified. The preparation was good. Retroflexion was performed in the rectum. The views were good. The patient tolerated the procedure well. No immediate complications and no significant blood loss. Findings: normal colon to cecumPathology specimens: ID Type Source Tests Collected by Time Destination  1 : Gastric fundus polyps Ap Specimen Biopsy PATHOLOGY TISSUE REQUEST Mabel DELENA Bandy, MD 07/23/2013 1115   Impression:  1. Fundic gland polyps s/p biopsy 2. No pathology in esophagus 3. Normal colonoscopy  Past Medical History:  Diagnosis Date   Acid reflux    Anxiety    Depression    Hypothyroidism    Knee osteoarthritis    with degenerative medial meniscus tear   Meniscus tear    Osteoarthritis    Past Surgical History:  Procedure Laterality Date   CHOLECYSTECTOMY     FOOT SURGERY     bilateral plantar fascitis   HYSTEROSCOPY WITH D & C N/A 01/31/2013   Procedure: DILATATION AND CURETTAGE  TRU CLEAR /HYSTEROSCOPY;  Surgeon: Randine VEAR Fender, MD;  Location: [facility];  Service: Gynecology;  Laterality: N/A;   MELANOMA EXCISION Right 05/17/2013   Procedure: WIDE EXCISION MELANOMA RIGHT UPPER ARM;  Surgeon: Vicenta DELENA Poli, MD;  Location: [facility];  Service: General;  Laterality: Right;   TONSILLECTOMY     UPPER GI ENDOSCOPY  3/15   Social History: She is single.  She is an Production designer, theatre/television/film.  Non-smoker.  She drinks 1 alcoholic beverage daily or less.  No drug use.  Family History: Paternal grandmother with colon cancer diagnosed in her 20s.  Mother with hypertension and CVA.  Paternal grandmother with liver cancer.  Allergies  Allergen Reactions   Bee Venom Swelling and Other (See Comments)      Outpatient Encounter Medications as of 11/02/2023  Medication Sig   buPROPion  (WELLBUTRIN  XL) 300 MG 24 hr tablet Take 300 mg by mouth daily.   Diethylpropion HCl  CR 75 MG TB24 Take 1 tablet by mouth daily in the afternoon.   hydrochlorothiazide (MICROZIDE) 12.5 MG capsule Take 12.5 mg by mouth daily.   levothyroxine  (SYNTHROID ) 75 MCG tablet 1 tablet   Serdexmethylphen-Dexmethylphen (AZSTARYS) 39.2-7.8 MG CAPS Take 1 capsule by mouth daily.   No facility-administered encounter medications on file as of 11/02/2023.   REVIEW OF SYSTEMS:  Gen: Denies fever, sweats or chills. No weight loss.  CV: Denies chest pain, palpitations or edema. Resp: Denies cough, shortness of breath of hemoptysis.  GI: See HPI. GU: Denies urinary burning, blood in urine, increased urinary frequency or incontinence. MS: + Arthritis. Derm: Denies rash, itchiness, skin lesions or unhealing ulcers. Psych:+ Depression. Heme: Denies bruising, easy bleeding. Neuro:  Denies headaches, dizziness or paresthesias. Endo:  Denies any problems with DM, thyroid  or adrenal function.  PHYSICAL EXAM: BP 108/64 (BP Location: Right Wrist, Patient Position: Sitting, Cuff Size: Normal)   Pulse 72   Ht 5' 2 (1.575 m)   Wt (!) 315 lb 6 oz (143.1 kg)   LMP 07/01/2017 (Approximate)   BMI 57.68 kg/m   LMP 07/01/2017 (Approximate)  General: 58 year old female in no acute distress. Head: Normocephalic and atraumatic. Eyes:  Sclerae non-icteric, conjunctive pink. Ears: Normal auditory acuity. Mouth: Dentition intact. No ulcers or lesions.  Neck: Supple, no lymphadenopathy or thyromegaly.  Lungs: Clear bilaterally to auscultation without wheezes, crackles or rhonchi. Heart: Regular rate and rhythm. No murmur, rub or gallop appreciated.  Abdomen: Soft, obese abdomen. Nontender, nondistended. No masses. No hepatosplenomegaly. Normoactive bowel sounds x 4 quadrants.  Diastasis recti.  Moderate umbilical hernia present. Rectal: Deferred.  Musculoskeletal: Symmetrical with no gross deformities. Skin: Warm and dry. No rash or lesions on visible extremities. Extremities: Trace edema to the left  pretibial/left ankle varicosities. Neurological: Alert oriented x 4, no focal deficits.  Psychological: Alert and cooperative. Normal mood and affect.  ASSESSMENT AND PLAN:  58 year old female presents to schedule a screening colonoscopy. Normal colonoscopy 07/2013.  Maternal grandmother diagnosed with colon cancer in her 35s. -Colonoscopy at [facility] (BM > 50) benefits and risks discussed including risk with sedation, risk of bleeding, perforation and infection   GERD, nighttime reflux symptoms, awakens in the morning with acid taste in mouth. She develops active reflux symptoms if she skips 2 days of omeprazole.  EGD 07/2013 identified fundic gland polyps otherwise was unremarkable. -GERD diet -EGD at [facility] at time of colonoscopy benefits and risks discussed including risk with sedation, risk of bleeding, perforation and infection  -Continue Omeprazole 20 mg every morning -Add Famotidine 20 mg 1 tab p.o. nightly           CC:  Cleotilde Ronal RAMAN, MD

## 2023-11-02 NOTE — Patient Instructions (Signed)
 We have sent the following medications to your pharmacy for you to pick up at your convenience: famotidine one capsule daily at bedtime.   You have been scheduled for an endoscopy and colonoscopy. Please follow the written instructions given to you at your visit today.  If you use inhalers (even only as needed), please bring them with you on the day of your procedure.  DO NOT TAKE 7 DAYS PRIOR TO TEST- Trulicity (dulaglutide) Ozempic, Wegovy (semaglutide) Mounjaro (tirzepatide) Bydureon Bcise (exanatide extended release)  DO NOT TAKE 1 DAY PRIOR TO YOUR TEST Rybelsus (semaglutide) Adlyxin (lixisenatide) Victoza (liraglutide) Byetta (exanatide) ___________________________________________________________________________   If your blood pressure at your visit was 140/90 or greater, please contact your primary care physician to follow up on this.  _______________________________________________________  If you are age 56 or older, your body mass index should be between 23-30. Your Body mass index is 57.68 kg/m. If this is out of the aforementioned range listed, please consider follow up with your Primary Care Provider.  If you are age 56 or younger, your body mass index should be between 19-25. Your Body mass index is 57.68 kg/m. If this is out of the aformentioned range listed, please consider follow up with your Primary Care Provider.   ________________________________________________________  The [facility] providers would like to encourage you to use MYCHART to communicate with providers for non-urgent requests or questions.  Due to long hold times on the telephone, sending your provider a message by [facility] may be a faster and more efficient way to get a response.  Please allow 48 business hours for a response.  Please remember that this is for non-urgent requests.  _______________________________________________________  GERD in Adults: Diet Changes When you have  gastroesophageal reflux disease (GERD), you may need to make changes to your diet. Choosing the right foods can help with your symptoms. Think about working with an expert in healthy eating called a dietitian. They can help you make healthy food choices. What are tips for following this plan? Reading food labels Look for foods that are low in saturated fat. Foods that may help with your symptoms include: Foods with less than 5% of daily value (DV) of fat. Foods with 0 grams of trans fat. Cooking [employer] in ways that don't use a lot of fat. These ways include: Baking. Steaming. Grilling. Broiling. To add flavor, try to use herbs that are low in spice and acidity. Avoid frying your food. Meal planning  Eat small meals often rather than eating 3 large meals each day. Eat your meals slowly in a place where you feel relaxed. If told by your health care provider, avoid: Foods that cause symptoms. Keep a food diary to keep track of foods that cause symptoms. Alcohol. Drinking a lot of liquid with meals. General instructions For 2-3 hours after you eat, avoid: Bending over. Exercise. Lying down. Chew sugar-free gum after meals. What foods should I eat? Eat a healthy diet. Try to include: Foods with high amounts of fiber. These include: Fruits and vegetables. Whole grains and beans. Low-fat dairy products. Lean meats, fish, and poultry. Egg whites. Foods that cause symptoms in someone else may not cause symptoms for you. Work with your provider to find foods that are safe for you. The items listed above may not be all the foods and drinks you can have. Talk with a dietitian to learn more. The items listed above may not be a complete list of foods and beverages you can eat  and drink. Contact a dietitian for more information. What foods should I avoid? Limiting some of these foods may help with your symptoms. Each person is different. Talk with a dietitian or your provider to help  you find the exact foods to avoid. Some of the foods to avoid may include: Fruits Fruits with a lot of acid in them. These may include citrus fruits, such as oranges, grapefruit, pineapple, and lemons. Vegetables Deep-fried vegetables, such as [place] fries. Vegetables, sauces, or toppings made with added fat and vegetables with acid in them. These may include tomatoes and tomato products, chili peppers, onions, garlic, and horseradish. Grains Pastries or quick breads with added fat. Meats and other proteins High-fat meats, such as fatty beef or pork, hot dogs, ribs, ham, sausage, salami, and bacon. Fried meat or protein, such as fried fish and fried chicken. Egg yolks. Fats and oils Butter. Margarine. Shortening. Ghee. Drinks Coffee and other drinks with caffeine in them. Fizzy and sugary drinks, such as soda and energy drinks. Fruit juice made with acidic fruits, such as orange or grapefruit. Tomato juice. Sweets and desserts Chocolate and cocoa. Donuts. Seasonings and condiments Mint, such as peppermint and spearmint. Condiments, herbs, or seasonings that cause symptoms. These may include curry, hot sauce, or vinegar-based salad dressings. The items listed above may not be all the foods and drinks you should avoid. Talk with a dietitian to learn more. Questions to ask your health care provider Changes to your diet and everyday life are often the first steps taken to manage symptoms of GERD. If these changes don't help, talk with your provider about taking medicines. Where to find more information International [employer] for Gastrointestinal Disorders: aboutgerd.org This information is not intended to replace advice given to you by your health care provider. Make sure you discuss any questions you have with your health care provider. Document Revised: 03/01/2023 Document Reviewed: 09/15/2022 Elsevier Patient Education  2024 [employer].

## 2023-11-06 NOTE — Progress Notes (Signed)
 Agree with assessment and plan as outlined.

## 2023-12-06 ENCOUNTER — Telehealth: Admitting: Family Medicine

## 2023-12-06 DIAGNOSIS — L539 Erythematous condition, unspecified: Secondary | ICD-10-CM | POA: Diagnosis not present

## 2023-12-06 DIAGNOSIS — R609 Edema, unspecified: Secondary | ICD-10-CM | POA: Diagnosis not present

## 2023-12-06 DIAGNOSIS — W57XXXA Bitten or stung by nonvenomous insect and other nonvenomous arthropods, initial encounter: Secondary | ICD-10-CM

## 2023-12-06 MED ORDER — PREDNISONE 20 MG PO TABS: 20.0000 mg | ORAL_TABLET | Freq: Every day | ORAL | 0 refills | Status: AC

## 2023-12-06 NOTE — Progress Notes (Signed)
 Virtual Visit Consent   [name], you are scheduled for a virtual visit with a [facility] provider today. Just as with appointments in the office, your consent must be obtained to participate. Your consent will be active for this visit and any virtual visit you may have with one of our providers in the next 365 days. If you have a MyChart account, a copy of this consent can be sent to you electronically.  As this is a virtual visit, video technology does not allow for your provider to perform a traditional examination. This may limit your provider's ability to fully assess your condition. If your provider identifies any concerns that need to be evaluated in person or the need to arrange testing (such as labs, EKG, etc.), we will make arrangements to do so. Although advances in technology are sophisticated, we cannot ensure that it will always work on either your end or our end. If the connection with a video visit is poor, the visit may have to be switched to a telephone visit. With either a video or telephone visit, we are not always able to ensure that we have a secure connection.  By engaging in this virtual visit, you consent to the provision of healthcare and authorize for your insurance to be billed (if applicable) for the services provided during this visit. Depending on your insurance coverage, you may receive a charge related to this service.  I need to obtain your verbal consent now. Are you willing to proceed with your visit today? [name] has provided verbal consent on 12/06/2023 for a virtual visit (video or telephone). Chiquita CHRISTELLA Barefoot, NP  Date: 12/06/2023 9:45 AM   Virtual Visit via Video Note   I, Chiquita CHRISTELLA Barefoot, connected with  [name]  ([id number], 1965/07/24) on 12/06/23 at  9:45 AM EDT by a video-enabled telemedicine application and verified that I am speaking with the correct person using two identifiers.  Location: Patient: Virtual Visit Location Patient:  Home Provider: Virtual Visit Location Provider: Home Office   I discussed the limitations of evaluation and management by telemedicine and the availability of in person appointments. The patient expressed understanding and agreed to proceed.    History of Present Illness: Dominique Mcpherson is a 58 y.o. who identifies as a female who was assigned female at birth, and is being seen today for insect bites.   Swelling and hives post insect bite- right forearm, and hand and left eyebrow area Tried camphor menthol and after bite with no improvement. Swelling continued so tried benadryl and zytrec with no improvement. Not that itchy, but it is warm and continues to swell.   Denies respiratory changes or response, fevers or chills. Does have allergy to bees, but did not see this insect.   Problems:  Patient Active Problem List   Diagnosis Date Noted   Acquired hypothyroidism 10/24/2020   Allergic rhinitis 10/24/2020   Amnesia 10/24/2020   Arthritis of hip 10/24/2020   Chronic pain 10/24/2020   Family history of malignant neoplasm of gastrointestinal tract 10/24/2020   Gastro-esophageal reflux disease without esophagitis 10/24/2020   Localized, primary osteoarthritis of hand 10/24/2020   Meralgia paresthetica 10/24/2020   Moderate major depression (HCC) 10/24/2020   Scapulalgia 10/24/2020   Pain in right hip 10/24/2020   Vitamin D deficiency 10/24/2020   OA (osteoarthritis) of knee 07/01/2020   Knee pain, left 07/01/2020   Left elbow pain 06/11/2016   Achilles tendon pain 06/11/2016   Melanoma  of upper arm (HCC) 05/09/2013   Morbid obesity (HCC) 11/29/2012    Allergies:  Allergies  Allergen Reactions   Bee Venom Swelling and Other (See Comments)   Medications:  Current Outpatient Medications:    buPROPion  (WELLBUTRIN  XL) 300 MG 24 hr tablet, Take 300 mg by mouth daily., Disp: , Rfl:    famotidine  (PEPCID ) 20 MG tablet, Take 1 tablet (20 mg total) by mouth at bedtime., Disp: 30  tablet, Rfl: 1   glucosamine-chondroitin 500-400 MG tablet, Take 1 tablet by mouth daily., Disp: , Rfl:    guanFACINE (INTUNIV) 4 MG TB24 ER tablet, Take 4 mg by mouth daily., Disp: , Rfl:    hydrochlorothiazide (MICROZIDE) 12.5 MG capsule, Take 12.5 mg by mouth daily., Disp: , Rfl:    levothyroxine  (SYNTHROID ) 75 MCG tablet, 1 tablet, Disp: , Rfl:    loratadine (CLARITIN) 10 MG tablet, Take 10 mg by mouth daily., Disp: , Rfl:    Lysine 500 MG TABS, Take 500 mg by mouth daily., Disp: , Rfl:    omeprazole (PRILOSEC) 20 MG capsule, Take 20 mg by mouth daily., Disp: , Rfl:    Serdexmethylphen-Dexmethylphen (AZSTARYS) 39.2-7.8 MG CAPS, Take 1 capsule by mouth daily., Disp: , Rfl:    VITAMIN D, CHOLECALCIFEROL, PO, Take by mouth., Disp: , Rfl:   Observations/Objective: Patient is well-developed, well-nourished in no acute distress.  Resting comfortably  at home.  Head is normocephalic, atraumatic.  No labored breathing.  Speech is clear and coherent with logical content.  Patient is alert and oriented at baseline.  Noted swelling of right forearm, hand and wrist, and above the left eye brow.   Assessment and Plan:   1. Redness (Primary)  - predniSONE  (DELTASONE ) 20 MG tablet; Take 1 tablet (20 mg total) by mouth daily with breakfast for 5 days.  Dispense: 5 tablet; Refill: 0  2. Swelling  - predniSONE  (DELTASONE ) 20 MG tablet; Take 1 tablet (20 mg total) by mouth daily with breakfast for 5 days.  Dispense: 5 tablet; Refill: 0  3. Insect bite, unspecified site, initial encounter  - predniSONE  (DELTASONE ) 20 MG tablet; Take 1 tablet (20 mg total) by mouth daily with breakfast for 5 days.  Dispense: 5 tablet; Refill: 0   -keep area clean  -watch for worsening symptoms, follow up in person if not improving -pred to help with reaction given the on going swelling with zytrec on board -no ABX at this time, does not seem infected as much as histamine reaction    Reviewed side effects,  risks and benefits of medication.    Patient acknowledged agreement and understanding of the plan.   Past Medical, Surgical, Social History, Allergies, and Medications have been Reviewed.   Follow Up Instructions: I discussed the assessment and treatment plan with the patient. The patient was provided an opportunity to ask questions and all were answered. The patient agreed with the plan and demonstrated an understanding of the instructions.  A copy of instructions were sent to the patient via MyChart unless otherwise noted below.    The patient was advised to call back or seek an in-person evaluation if the symptoms worsen or if the condition fails to improve as anticipated.    Chiquita CHRISTELLA Barefoot, NP

## 2023-12-06 NOTE — Patient Instructions (Addendum)
 Dominique Mcpherson, thank you for joining Dominique CHRISTELLA Barefoot, NP for today's virtual visit.  While this provider is not your primary care provider (PCP), if your PCP is located in our provider database this encounter information will be shared with them immediately following your visit.   A [facility] MyChart account gives you access to today's visit and all your visits, tests, and labs performed at [facility]  click here if you don't have a [facility] MyChart account or go to [email or web address]  Consent: (Patient) Dominique Mcpherson provided verbal consent for this virtual visit at the beginning of the encounter.  Current Medications:  Current Outpatient Medications:    predniSONE  (DELTASONE ) 20 MG tablet, Take 1 tablet (20 mg total) by mouth daily with breakfast for 5 days., Disp: 5 tablet, Rfl: 0   buPROPion  (WELLBUTRIN  XL) 300 MG 24 hr tablet, Take 300 mg by mouth daily., Disp: , Rfl:    famotidine  (PEPCID ) 20 MG tablet, Take 1 tablet (20 mg total) by mouth at bedtime., Disp: 30 tablet, Rfl: 1   glucosamine-chondroitin 500-400 MG tablet, Take 1 tablet by mouth daily., Disp: , Rfl:    guanFACINE (INTUNIV) 4 MG TB24 ER tablet, Take 4 mg by mouth daily., Disp: , Rfl:    hydrochlorothiazide (MICROZIDE) 12.5 MG capsule, Take 12.5 mg by mouth daily., Disp: , Rfl:    levothyroxine  (SYNTHROID ) 75 MCG tablet, 1 tablet, Disp: , Rfl:    loratadine (CLARITIN) 10 MG tablet, Take 10 mg by mouth daily., Disp: , Rfl:    Lysine 500 MG TABS, Take 500 mg by mouth daily., Disp: , Rfl:    omeprazole (PRILOSEC) 20 MG capsule, Take 20 mg by mouth daily., Disp: , Rfl:    Serdexmethylphen-Dexmethylphen (AZSTARYS) 39.2-7.8 MG CAPS, Take 1 capsule by mouth daily., Disp: , Rfl:    VITAMIN D, CHOLECALCIFEROL, PO, Take by mouth., Disp: , Rfl:    Medications ordered in this encounter:  Meds ordered this encounter  Medications   predniSONE  (DELTASONE ) 20 MG tablet    Sig: Take 1 tablet (20 mg total) by  mouth daily with breakfast for 5 days.    Dispense:  5 tablet    Refill:  0    Supervising Provider:   BLAISE ALEENE KIDD [[id number]]     *If you need refills on other medications prior to your next appointment, please contact your pharmacy*  Follow-Up: Call back or seek an in-person evaluation if the symptoms worsen or if the condition fails to improve as anticipated.  [facility] [phone]  Other Instructions  -keep area clean  -watch for worsening symptoms, follow up in person if not improving   Insect Bite, Adult An insect bite can make your skin red, itchy, and swollen. Some insects can spread disease to people with a bite. However, most insect bites do not lead to disease, and most are not serious. What are the causes? Insects may bite for many reasons, including: Hunger. To defend themselves. Insects that bite include: Spiders. Mosquitoes. Flies. Ticks and fleas. Ants. Kissing bugs. Chiggers. What are the signs or symptoms? Symptoms often last for 2-4 days. However, itching can last up to 10 days. Symptoms include: Itching or pain in the bite area. Redness and swelling in the bite area. An open wound. In rare cases, a person may have a very bad allergic reaction (anaphylactic reaction) to a bite. Symptoms of an anaphylactic reaction may include: Feeling warm in the face (flushed). Your face may  turn red. Itchy, red, swollen areas of skin (hives). Swelling of the eyes, lips, face, mouth, tongue, or throat. Trouble with breathing, talking, or swallowing. High-pitched whistling sounds, most often when breathing out (wheezing). Feeling dizzy or light-headed. Fainting. Pain or cramps in your belly (abdomen). Vomiting. Watery poop (diarrhea). How is this treated? Most insect bites are not serious. Symptoms often go away on their own. When treatment is advised, it may include: Putting ice on the bite area. Putting a cream or lotion, like calamine  lotion, on the bite area. This helps with itching. Using medicines called antihistamines. You may also need: A tetanus shot if you are not up to date. An antibiotic cream or medicine. This treatment is needed if the bite area gets infected. Follow these instructions at home: Bite area care  Do not scratch the bite area. It may help to cover the bite area with a bandage or close-fitting clothing. Keep the bite area clean and dry. Check the bite area every day for signs of infection. Check for: More redness, swelling, or pain. Fluid or blood. Warmth. Pus or a bad smell. Wash your hands often. Managing pain, itching, and swelling  You may put any of these on the bite area as told by your doctor: A paste made of baking soda and water. Cortisone cream. Calamine lotion. If told, put ice on the bite area. To do this: Put ice in a plastic bag. Place a towel between your skin and the bag. Leave the ice on for 20 minutes, 2-3 times a day. If your skin turns bright red, take off the ice right away to prevent skin damage. The risk of skin damage is higher if you cannot feel pain, heat, or cold. General instructions Apply or take over-the-counter and prescription medicines only as told by your doctor. If you were prescribed antibiotics, take or apply them as told by your doctor. Do not stop using them even if you start to feel better. How is this prevented? To help you have a lower risk of insect bites: When you are outside, wear clothes that cover your arms and legs. Use insect repellent. The best insect repellents contain one of these: DEET. Picaridin. Oil of lemon eucalyptus (OLE). IR3535. Consider spraying your clothing with a pesticide called permethrin. Permethrin helps prevent insect bites. It works for several weeks and for up to 5-6 clothing washes. Do not apply permethrin directly to the skin. If your home windows do not have screens, think about putting some in. If you will be  sleeping in an area where there are mosquitoes, consider covering your sleeping area with a mosquito net. Contact a doctor if: You have redness, swelling, or pain in the bite area. You have fluid or blood coming from the bite area. The bite area feels warm to the touch. You have pus or a bad smell coming from the bite area. You have a fever. Get help right away if: You have joint pain. You have a rash. You feel weak or more tired than you normally do. You have neck pain or a headache. You have signs of an anaphylactic reaction. Signs may include: Swelling of your eyes, lips, face, mouth, tongue, or throat. Feeling warm in the face. Itchy, red, swollen areas of skin. Trouble with breathing, talking, or swallowing. Wheezing. Feeling dizzy or light-headed. Fainting. Pain or cramps in your belly. Vomiting or watery poop. These symptoms may be an emergency. Get help right away. Call 911. Do not wait to see  if symptoms will go away. Do not drive yourself to the hospital. Summary An insect bite can make your skin red, itchy, and swollen. Treatment is usually not needed. Symptoms often go away on their own. Do not scratch the bite area. Keep it clean and dry. Use insect repellent to help prevent insect bites. Contact a doctor if you have signs of infection. This information is not intended to replace advice given to you by your health care provider. Make sure you discuss any questions you have with your health care provider. Document Revised: 07/14/2021 Document Reviewed: 07/14/2021 Elsevier Patient Education  2024 Elsevier [employer]   If you have been instructed to have an in-person evaluation today at a local Urgent Care facility, please use the link below. It will take you to a list of all of our available [facility], including address, phone number and hours of operation. Please do not delay care.  [facility]  If you or a family member do not have a primary  care provider, use the link below to schedule a visit and establish care. When you choose a [facility] primary care physician or advanced practice provider, you gain a long-term partner in health. Find a Primary Care Provider  Learn more about [facility]'s in-office and virtual care options: [facility] Get Care Now

## 2023-12-26 ENCOUNTER — Encounter: Payer: Self-pay | Admitting: Gastroenterology

## 2023-12-29 ENCOUNTER — Telehealth: Payer: Self-pay

## 2023-12-29 ENCOUNTER — Encounter: Payer: Self-pay | Admitting: Internal Medicine

## 2023-12-29 ENCOUNTER — Other Ambulatory Visit: Payer: Self-pay

## 2023-12-29 DIAGNOSIS — Z1211 Encounter for screening for malignant neoplasm of colon: Secondary | ICD-10-CM

## 2023-12-29 DIAGNOSIS — K219 Gastro-esophageal reflux disease without esophagitis: Secondary | ICD-10-CM

## 2023-12-29 DIAGNOSIS — M170 Bilateral primary osteoarthritis of knee: Secondary | ICD-10-CM

## 2023-12-29 NOTE — Telephone Encounter (Signed)
 Patient is scheduled for her orthoisc bilateral knee see referrals tab

## 2023-12-30 ENCOUNTER — Ambulatory Visit: Admitting: Internal Medicine

## 2024-01-03 ENCOUNTER — Encounter (HOSPITAL_COMMUNITY): Payer: Self-pay | Admitting: Gastroenterology

## 2024-01-03 ENCOUNTER — Encounter: Payer: Self-pay | Admitting: Orthopedic Surgery

## 2024-01-04 ENCOUNTER — Telehealth: Payer: Self-pay

## 2024-01-04 NOTE — Telephone Encounter (Signed)
 Procedure:EGD Procedure date: 01/09/24 Procedure location: [facility] Arrival Time: 6:00 Spoke with the patient Y/N:N Any prep concerns? N  Has the patient obtained the prep from the pharmacy ? N Do you have a care partner and transportation: N Any additional concerns? N   I called patient on 3 different occasions and on the 3rd call I left a detailed message about the procedure and left the office number in case she has questions are concerns.`

## 2024-01-04 NOTE — Telephone Encounter (Signed)
 Can someone please try calling this patient tomorrow and Friday to see if you can get ahold of her, or call family listed in points of contact - to see if this patient is planning on coming in for colonoscopy or not next week? Thanks

## 2024-01-04 NOTE — Telephone Encounter (Signed)
 Got it, okay, sorry to hear this

## 2024-01-04 NOTE — Telephone Encounter (Signed)
 See 12/29/23 patient message. Patient has been cancelled off 9/8 schedule.

## 2024-01-09 ENCOUNTER — Ambulatory Visit (HOSPITAL_COMMUNITY): Admission: RE | Admit: 2024-01-09 | Source: Home / Self Care | Admitting: Gastroenterology

## 2024-01-09 ENCOUNTER — Encounter (HOSPITAL_COMMUNITY): Admission: RE | Payer: Self-pay | Source: Home / Self Care

## 2024-01-09 SURGERY — EGD (ESOPHAGOGASTRODUODENOSCOPY)
Anesthesia: Monitor Anesthesia Care

## 2024-01-18 ENCOUNTER — Ambulatory Visit: Admitting: Orthopedic Surgery

## 2024-01-18 DIAGNOSIS — M1712 Unilateral primary osteoarthritis, left knee: Secondary | ICD-10-CM | POA: Diagnosis not present

## 2024-01-18 DIAGNOSIS — M170 Bilateral primary osteoarthritis of knee: Secondary | ICD-10-CM

## 2024-01-18 MED ORDER — LIDOCAINE HCL 1 % IJ SOLN
5.0000 mL | INTRAMUSCULAR | Status: AC | PRN
  Administered 2024-01-18: 5 mL

## 2024-01-18 MED ORDER — HYALURONAN 30 MG/2ML IX SOSY
30.0000 mg | PREFILLED_SYRINGE | INTRA_ARTICULAR | Status: AC | PRN
  Administered 2024-01-18: 30 mg via INTRA_ARTICULAR

## 2024-01-18 NOTE — Progress Notes (Signed)
   Procedure Note  Patient: SHAHRZAD KOBLE             Date of Birth: 01-18-1966           MRN: [id number]             Visit Date: 01/18/2024  Procedures: Visit Diagnoses:  1. Primary osteoarthritis of knees, bilateral     Large Joint Inj: L knee on 01/18/2024 7:47 AM Indications: diagnostic evaluation, joint swelling and pain Details: 18 G 1.5 in needle, superolateral approach  Arthrogram: No  Medications: 5 mL lidocaine  1 %; 30 mg Hyaluronan 30 MG/2ML Outcome: tolerated well, no immediate complications Procedure, treatment alternatives, risks and benefits explained, specific risks discussed. Consent was given by the patient. Immediately prior to procedure a time out was called to verify the correct patient, procedure, equipment, support staff and site/side marked as required. Patient was prepped and draped in the usual sterile fashion.     [id number]

## 2024-01-25 ENCOUNTER — Encounter: Payer: Self-pay | Admitting: Orthopedic Surgery

## 2024-01-25 ENCOUNTER — Ambulatory Visit: Admitting: Orthopedic Surgery

## 2024-01-25 DIAGNOSIS — M1712 Unilateral primary osteoarthritis, left knee: Secondary | ICD-10-CM | POA: Diagnosis not present

## 2024-01-25 DIAGNOSIS — M170 Bilateral primary osteoarthritis of knee: Secondary | ICD-10-CM

## 2024-01-25 MED ORDER — HYALURONAN 30 MG/2ML IX SOSY
30.0000 mg | PREFILLED_SYRINGE | INTRA_ARTICULAR | Status: AC | PRN
  Administered 2024-01-25: 30 mg via INTRA_ARTICULAR

## 2024-01-25 MED ORDER — LIDOCAINE HCL 1 % IJ SOLN
5.0000 mL | INTRAMUSCULAR | Status: AC | PRN
  Administered 2024-01-25: 5 mL

## 2024-01-25 NOTE — Progress Notes (Signed)
   Procedure Note  Patient: Dominique Mcpherson             Date of Birth: May 29, 1965           MRN: [id number]             Visit Date: 01/25/2024  Procedures: Visit Diagnoses:  1. Primary osteoarthritis of knees, bilateral     Large Joint Inj: L knee on 01/25/2024 8:01 AM Indications: diagnostic evaluation, joint swelling and pain Details: 18 G 1.5 in needle, superolateral approach  Arthrogram: No  Medications: 5 mL lidocaine  1 %; 30 mg Hyaluronan 30 MG/2ML Outcome: tolerated well, no immediate complications Procedure, treatment alternatives, risks and benefits explained, specific risks discussed. Consent was given by the patient. Immediately prior to procedure a time out was called to verify the correct patient, procedure, equipment, support staff and site/side marked as required. Patient was prepped and draped in the usual sterile fashion.      Lot #[id number].  May consider medial approach next week for final injection

## 2024-01-27 ENCOUNTER — Telehealth: Payer: Self-pay | Admitting: Orthopedic Surgery

## 2024-01-27 NOTE — Telephone Encounter (Signed)
 CALLED PT AND LEFT VM ASKING IF PT CAN CHANGE APPT TIME TO 8 AM PER DEAN. PLEASE NOTIFY LAUREN F IF PT AGREES TO 8 AM SAME DAY APPT.

## 2024-02-01 ENCOUNTER — Ambulatory Visit: Admitting: Orthopedic Surgery

## 2024-02-01 ENCOUNTER — Other Ambulatory Visit: Payer: Self-pay

## 2024-02-01 DIAGNOSIS — M1712 Unilateral primary osteoarthritis, left knee: Secondary | ICD-10-CM | POA: Diagnosis not present

## 2024-02-01 DIAGNOSIS — M170 Bilateral primary osteoarthritis of knee: Secondary | ICD-10-CM

## 2024-02-01 MED ORDER — LIDOCAINE HCL 1 % IJ SOLN
5.0000 mL | INTRAMUSCULAR | Status: AC | PRN
  Administered 2024-02-01: 5 mL

## 2024-02-01 MED ORDER — HYALURONAN 30 MG/2ML IX SOSY
30.0000 mg | PREFILLED_SYRINGE | INTRA_ARTICULAR | Status: AC | PRN
  Administered 2024-02-01: 30 mg via INTRA_ARTICULAR

## 2024-02-01 NOTE — Progress Notes (Signed)
   Procedure Note  Patient: Dominique Mcpherson             Date of Birth: Oct 01, 1965           MRN: [id number]             Visit Date: 02/01/2024  Procedures: Visit Diagnoses:  1. Primary osteoarthritis of knees, bilateral     Large Joint Inj: L knee on 02/01/2024 8:08 AM Indications: diagnostic evaluation, joint swelling and pain Details: 18 G 1.5 in needle, superolateral approach  Arthrogram: No  Medications: 5 mL lidocaine  1 %; 30 mg Hyaluronan 30 MG/2ML Outcome: tolerated well, no immediate complications Procedure, treatment alternatives, risks and benefits explained, specific risks discussed. Consent was given by the patient. Immediately prior to procedure a time out was called to verify the correct patient, procedure, equipment, support staff and site/side marked as required. Patient was prepped and draped in the usual sterile fashion.     Ultrasound utilized to facilitate entry into the joint from the medial aspect

## 2024-03-05 ENCOUNTER — Encounter: Payer: Self-pay | Admitting: Radiology

## 2024-03-21 ENCOUNTER — Other Ambulatory Visit: Payer: Self-pay | Admitting: Nurse Practitioner

## 2024-04-04 ENCOUNTER — Encounter: Payer: Self-pay | Admitting: Orthopedic Surgery

## 2024-04-04 ENCOUNTER — Ambulatory Visit: Admitting: Orthopedic Surgery

## 2024-04-04 DIAGNOSIS — M1711 Unilateral primary osteoarthritis, right knee: Secondary | ICD-10-CM | POA: Diagnosis not present

## 2024-04-04 DIAGNOSIS — M170 Bilateral primary osteoarthritis of knee: Secondary | ICD-10-CM | POA: Diagnosis not present

## 2024-04-04 MED ORDER — BUPIVACAINE HCL 0.25 % IJ SOLN
4.0000 mL | INTRAMUSCULAR | Status: AC | PRN
  Administered 2024-04-04: 4 mL via INTRA_ARTICULAR

## 2024-04-04 MED ORDER — TRIAMCINOLONE ACETONIDE 40 MG/ML IJ SUSP
40.0000 mg | INTRAMUSCULAR | Status: AC | PRN
  Administered 2024-04-04: 40 mg via INTRA_ARTICULAR

## 2024-04-04 MED ORDER — LIDOCAINE HCL 1 % IJ SOLN
5.0000 mL | INTRAMUSCULAR | Status: AC | PRN
  Administered 2024-04-04: 5 mL

## 2024-04-04 NOTE — Progress Notes (Signed)
 Office Visit Note   Patient: Dominique Mcpherson           Date of Birth: [birth year]           MRN: [id number] Visit Date: 04/04/2024 Requested by: Regino Slater, MD [address] [place],  [place] [address] PCP: Regino Slater, MD  Subjective: Chief Complaint  Patient presents with   Left Knee - Follow-up    HPI: Dominique Mcpherson is a 58 y.o. female who presents to the office reporting left knee pain as well as some mechanical symptoms..  Right knee is functioning reasonably well.  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  This will involve a lot of steps as well as getting up and down off a stage.  Feels like the left knee is developing more mechanical symptoms.              ROS: All systems reviewed are negative as they relate to the chief complaint within the history of present illness.  Patient denies fevers or chills.  Assessment & Plan: Visit Diagnoses:  1. Primary osteoarthritis of knees, bilateral     Plan: Impression is end-stage left knee arthritis which is becoming more symptomatic in the left knee compared to the right.  No effusion in the knee joint today.  Gel shots performed 2 months ago were somewhat helpful but not as much as they have been in the past.  Cortisone injection performed today into the joint.  Will see how she does with that intervention.  She will follow-up with us  as needed.  Follow-Up Instructions: No follow-ups on file.   Orders:  No orders of the defined types were placed in this encounter.  No orders of the defined types were placed in this encounter.     Procedures: Large Joint Inj: R knee on 04/04/2024 7:47 AM Indications: diagnostic evaluation, joint swelling and pain Details: 18 G 1.5 in needle, superolateral approach  Arthrogram: No  Medications: 5 mL lidocaine  1 %; 4 mL bupivacaine  0.25 %; 40 mg triamcinolone  acetonide 40 MG/ML Outcome: tolerated well, no immediate complications Procedure,  treatment alternatives, risks and benefits explained, specific risks discussed. Consent was given by the patient. Immediately prior to procedure a time out was called to verify the correct patient, procedure, equipment, support staff and site/side marked as required. Patient was prepped and draped in the usual sterile fashion.       Clinical Data: No additional findings.  Objective: Vital Signs: LMP 07/01/2017 (Approximate)   Physical Exam:  Constitutional: Patient appears well-developed HEENT:  Head: Normocephalic Eyes:EOM are normal Neck: Normal range of motion Cardiovascular: Normal rate Pulmonary/chest: Effort normal Neurologic: Patient is alert Skin: Skin is warm Psychiatric: Patient has normal mood and affect  Ortho Exam: Ortho exam demonstrates flexion to about 90 to 95 degrees in both knees.  More patellofemoral crepitus on the left compared to the right.  No effusion.  Extensor mechanism intact.  Collateral ligaments are stable on the left-hand side.  Specialty Comments:  No specialty comments available.  Imaging: No results found.   PMFS History: Patient Active Problem List   Diagnosis Date Noted   Acquired hypothyroidism 10/24/2020   Allergic rhinitis 10/24/2020   Amnesia 10/24/2020   Arthritis of hip 10/24/2020   Chronic pain 10/24/2020   Family history of malignant neoplasm of gastrointestinal tract 10/24/2020   Gastro-esophageal reflux disease without esophagitis 10/24/2020   Localized, primary osteoarthritis of hand 10/24/2020   Meralgia  paresthetica 10/24/2020   Moderate major depression (HCC) 10/24/2020   Scapulalgia 10/24/2020   Pain in right hip 10/24/2020   Vitamin D deficiency 10/24/2020   OA (osteoarthritis) of knee 07/01/2020   Knee pain, left 07/01/2020   Left elbow pain 06/11/2016   Achilles tendon pain 06/11/2016   Melanoma of upper arm (HCC) 05/09/2013   Morbid obesity (HCC) 11/29/2012   Past Medical History:  Diagnosis Date   Acid  reflux    Anxiety    Depression    Hypothyroidism    Knee osteoarthritis    with degenerative medial meniscus tear   Meniscus tear    Osteoarthritis     Family History  Problem Relation Age of Onset   Stroke Mother    Hypertension Mother    Hypertension Brother    Heart disease Maternal Grandmother    Cancer - Colon Paternal Grandmother    Liver cancer Paternal Grandmother    Breast cancer Neg Hx     Past Surgical History:  Procedure Laterality Date   CHOLECYSTECTOMY     FOOT SURGERY     bilateral plantar fascitis   HYSTEROSCOPY WITH D & C N/A 01/31/2013   Procedure: DILATATION AND CURETTAGE  TRU CLEAR /HYSTEROSCOPY;  Surgeon: Randine VEAR Fender, MD;  Location: [facility];  Service: Gynecology;  Laterality: N/A;   MELANOMA EXCISION Right 05/17/2013   Procedure: WIDE EXCISION MELANOMA RIGHT UPPER ARM;  Surgeon: Vicenta DELENA Poli, MD;  Location: [facility];  Service: General;  Laterality: Right;   TONSILLECTOMY     UPPER GI ENDOSCOPY  3/15   Social History   Occupational History   Not on file  Tobacco Use   Smoking status: Never   Smokeless tobacco: Never  Vaping Use   Vaping status: Never Used  Substance and Sexual Activity   Alcohol use: Yes    Alcohol/week: 2.0 standard drinks of alcohol    Types: 2 Standard drinks or equivalent per week    Comment: occ   Drug use: No   Sexual activity: Never    Partners: Male    Birth control/protection: Abstinence

## 2024-04-24 ENCOUNTER — Telehealth: Admitting: Physician Assistant

## 2024-04-24 DIAGNOSIS — J40 Bronchitis, not specified as acute or chronic: Secondary | ICD-10-CM

## 2024-04-24 MED ORDER — BENZONATATE 200 MG PO CAPS: 200.0000 mg | ORAL_CAPSULE | Freq: Three times a day (TID) | ORAL | 0 refills | Status: AC | PRN

## 2024-04-24 MED ORDER — PREDNISONE 20 MG PO TABS: 20.0000 mg | ORAL_TABLET | Freq: Two times a day (BID) | ORAL | 0 refills | Status: AC

## 2024-04-24 NOTE — Patient Instructions (Signed)
 " Dominique Mcpherson, thank you for joining [facility], PA-C for today's virtual visit.  While this provider is not your primary care provider (PCP), if your PCP is located in our provider database this encounter information will be shared with them immediately following your visit.   A [facility] MyChart account gives you access to today's visit and all your visits, tests, and labs performed at [facility]  click here if you don't have a [facility] MyChart account or go to [email or web address]  Consent: (Patient) Dominique Mcpherson provided verbal consent for this virtual visit at the beginning of the encounter.  Current Medications:  Current Outpatient Medications:    benzonatate  (TESSALON ) 200 MG capsule, Take 1 capsule (200 mg total) by mouth 3 (three) times daily as needed for up to 7 days for cough., Disp: 21 capsule, Rfl: 0   predniSONE  (DELTASONE ) 20 MG tablet, Take 1 tablet (20 mg total) by mouth 2 (two) times daily with a meal for 5 days., Disp: 10 tablet, Rfl: 0   buPROPion  (WELLBUTRIN  XL) 300 MG 24 hr tablet, Take 300 mg by mouth daily., Disp: , Rfl:    famotidine  (PEPCID ) 20 MG tablet, TAKE 1 TABLET(20 MG) BY MOUTH AT BEDTIME, Disp: 30 tablet, Rfl: 1   glucosamine-chondroitin 500-400 MG tablet, Take 1 tablet by mouth daily., Disp: , Rfl:    guanFACINE (INTUNIV) 4 MG TB24 ER tablet, Take 4 mg by mouth daily., Disp: , Rfl:    hydrochlorothiazide (MICROZIDE) 12.5 MG capsule, Take 12.5 mg by mouth daily., Disp: , Rfl:    levothyroxine  (SYNTHROID ) 75 MCG tablet, 1 tablet, Disp: , Rfl:    loratadine (CLARITIN) 10 MG tablet, Take 10 mg by mouth daily., Disp: , Rfl:    Lysine 500 MG TABS, Take 500 mg by mouth daily., Disp: , Rfl:    omeprazole (PRILOSEC) 20 MG capsule, Take 20 mg by mouth daily., Disp: , Rfl:    Serdexmethylphen-Dexmethylphen (AZSTARYS) 39.2-7.8 MG CAPS, Take 1 capsule by mouth daily., Disp: , Rfl:    VITAMIN D, CHOLECALCIFEROL, PO, Take by mouth., Disp: , Rfl:     Medications ordered in this encounter:  Meds ordered this encounter  Medications   benzonatate  (TESSALON ) 200 MG capsule    Sig: Take 1 capsule (200 mg total) by mouth 3 (three) times daily as needed for up to 7 days for cough.    Dispense:  21 capsule    Refill:  0    Supervising Provider:   BLAISE ALEENE KIDD [[id number]]   predniSONE  (DELTASONE ) 20 MG tablet    Sig: Take 1 tablet (20 mg total) by mouth 2 (two) times daily with a meal for 5 days.    Dispense:  10 tablet    Refill:  0    Supervising Provider:   BLAISE ALEENE KIDD [[id number]]     *If you need refills on other medications prior to your next appointment, please contact your pharmacy*  Follow-Up: Call back or seek an in-person evaluation if the symptoms worsen or if the condition fails to improve as anticipated.  [facility] [phone]  Other Instructions Please to the emergency room if any new or worsening symptoms  Please seek an in-person evaluation if the symptoms worsen or if the condition fails to improve as anticipated.  PCP follow-up in 5-7 days    If you have been instructed to have an in-person evaluation today at a local Urgent Care facility, please use the link below. It  will take you to a list of all of our available [facility], including address, phone number and hours of operation. Please do not delay care.  [facility]  If you or a family member do not have a primary care provider, use the link below to schedule a visit and establish care. When you choose a [facility] primary care physician or advanced practice provider, you gain a long-term partner in health. Find a Primary Care Provider  Learn more about [facility] in-office and virtual care options:  - Get Care Now  "

## 2024-04-24 NOTE — Progress Notes (Signed)
 " Virtual Visit Consent   Dominique Mcpherson, you are scheduled for a virtual visit with a [facility] provider today. Just as with appointments in the office, your consent must be obtained to participate. Your consent will be active for this visit and any virtual visit you may have with one of our providers in the next 365 days. If you have a MyChart account, a copy of this consent can be sent to you electronically.  As this is a virtual visit, video technology does not allow for your provider to perform a traditional examination. This may limit your provider's ability to fully assess your condition. If your provider identifies any concerns that need to be evaluated in person or the need to arrange testing (such as labs, EKG, etc.), we will make arrangements to do so. Although advances in technology are sophisticated, we cannot ensure that it will always work on either your end or our end. If the connection with a video visit is poor, the visit may have to be switched to a telephone visit. With either a video or telephone visit, we are not always able to ensure that we have a secure connection.  By engaging in this virtual visit, you consent to the provision of healthcare and authorize for your insurance to be billed (if applicable) for the services provided during this visit. Depending on your insurance coverage, you may receive a charge related to this service.  I need to obtain your verbal consent now. Are you willing to proceed with your visit today? Dominique Mcpherson has provided verbal consent on 04/24/2024 for a virtual visit (video or telephone). Dominique Mcpherson, [place]  Date: 04/24/2024 11:05 AM   Virtual Visit via Video Note   I, Eberardo Demello, connected with  Dominique Mcpherson  ([id number], [birth year]) on 04/24/2024 at 11:00 AM EST by a video-enabled telemedicine application and verified that I am speaking with the correct person using two identifiers.  Location: Patient: Virtual Visit Location Patient:  Home Provider: Virtual Visit Location Provider: Home Office   I discussed the limitations of evaluation and management by telemedicine and the availability of in person appointments. The patient expressed understanding and agreed to proceed.    History of Present Illness: Dominique Mcpherson is a 58 y.o. who identifies as a female who was assigned female at birth, and is being seen today for cough.  HPI: Patient presents for evaluation of cough going on for the last 3 weeks.  She reports she initially started with cold-like symptoms at the beginning of the month that subsided by December to 15th.  At this time she continues to have a somewhat productive cough and postnasal drainage.  She denies any chest pain, shortness of breath, fever, chills, nausea, vomiting.  She has tried over-the-counter cough medications and Mucinex with intermittent relief.  No history of pneumonia, non-smoker.    Problems:  Patient Active Problem List   Diagnosis Date Noted   Acquired hypothyroidism 10/24/2020   Allergic rhinitis 10/24/2020   Amnesia 10/24/2020   Arthritis of hip 10/24/2020   Chronic pain 10/24/2020   Family history of malignant neoplasm of gastrointestinal tract 10/24/2020   Gastro-esophageal reflux disease without esophagitis 10/24/2020   Localized, primary osteoarthritis of hand 10/24/2020   Meralgia paresthetica 10/24/2020   Moderate major depression (HCC) 10/24/2020   Scapulalgia 10/24/2020   Pain in right hip 10/24/2020   Vitamin D deficiency 10/24/2020   OA (osteoarthritis) of knee 07/01/2020   Knee pain, left 07/01/2020  Left elbow pain 06/11/2016   Achilles tendon pain 06/11/2016   Melanoma of upper arm (HCC) 05/09/2013   Morbid obesity (HCC) 11/29/2012    Allergies: Allergies[1] Medications: Current Medications[2]  Observations/Objective: Patient is well-developed, well-nourished in no acute distress.  Resting comfortably  at home.  Head is normocephalic, atraumatic.  Dry cough  noted over video No labored breathing.  Speech is clear and coherent with logical content.  Patient is alert and oriented at baseline.    Assessment and Plan: 1. Bronchitis (Primary) - benzonatate  (TESSALON ) 200 MG capsule; Take 1 capsule (200 mg total) by mouth 3 (three) times daily as needed for up to 7 days for cough.  Dispense: 21 capsule; Refill: 0 - predniSONE  (DELTASONE ) 20 MG tablet; Take 1 tablet (20 mg total) by mouth 2 (two) times daily with a meal for 5 days.  Dispense: 10 tablet; Refill: 0    Follow Up Instructions: I discussed the assessment and treatment plan with the patient. The patient was provided an opportunity to ask questions and all were answered. The patient agreed with the plan and demonstrated an understanding of the instructions.  A copy of instructions were sent to the patient via MyChart unless otherwise noted below.     The patient was advised to call back or seek an in-person evaluation if the symptoms worsen or if the condition fails to improve as anticipated.    Shonnie Poudrier, PA-C    [1]  Allergies Allergen Reactions   Bee Venom Swelling and Other (See Comments)  [2]  Current Outpatient Medications:    benzonatate  (TESSALON ) 200 MG capsule, Take 1 capsule (200 mg total) by mouth 3 (three) times daily as needed for up to 7 days for cough., Disp: 21 capsule, Rfl: 0   predniSONE  (DELTASONE ) 20 MG tablet, Take 1 tablet (20 mg total) by mouth 2 (two) times daily with a meal for 5 days., Disp: 10 tablet, Rfl: 0   buPROPion  (WELLBUTRIN  XL) 300 MG 24 hr tablet, Take 300 mg by mouth daily., Disp: , Rfl:    famotidine  (PEPCID ) 20 MG tablet, TAKE 1 TABLET(20 MG) BY MOUTH AT BEDTIME, Disp: 30 tablet, Rfl: 1   glucosamine-chondroitin 500-400 MG tablet, Take 1 tablet by mouth daily., Disp: , Rfl:    guanFACINE (INTUNIV) 4 MG TB24 ER tablet, Take 4 mg by mouth daily., Disp: , Rfl:    hydrochlorothiazide (MICROZIDE) 12.5 MG capsule, Take 12.5 mg by mouth daily.,  Disp: , Rfl:    levothyroxine  (SYNTHROID ) 75 MCG tablet, 1 tablet, Disp: , Rfl:    loratadine (CLARITIN) 10 MG tablet, Take 10 mg by mouth daily., Disp: , Rfl:    Lysine 500 MG TABS, Take 500 mg by mouth daily., Disp: , Rfl:    omeprazole (PRILOSEC) 20 MG capsule, Take 20 mg by mouth daily., Disp: , Rfl:    Serdexmethylphen-Dexmethylphen (AZSTARYS) 39.2-7.8 MG CAPS, Take 1 capsule by mouth daily., Disp: , Rfl:    VITAMIN D, CHOLECALCIFEROL, PO, Take by mouth., Disp: , Rfl:   "

## 2024-05-09 ENCOUNTER — Telehealth: Payer: Self-pay | Admitting: Gastroenterology

## 2024-05-09 ENCOUNTER — Other Ambulatory Visit: Payer: Self-pay | Admitting: Family Medicine

## 2024-05-09 DIAGNOSIS — Z1231 Encounter for screening mammogram for malignant neoplasm of breast: Secondary | ICD-10-CM

## 2024-05-09 MED ORDER — NA SULFATE-K SULFATE-MG SULF 17.5-3.13-1.6 GM/177ML PO SOLN: ORAL | 0 refills | Status: AC

## 2024-05-09 NOTE — Telephone Encounter (Signed)
 See addendum to 12/29/23 patient message for additional information.

## 2024-05-09 NOTE — Telephone Encounter (Signed)
 I have attempted to reach patient by phone to offer endo/colon at [facility] for 07/09/24. Unfortunately no answer. I did leave a voicemail to call back.

## 2024-05-09 NOTE — Telephone Encounter (Signed)
 Call from pt stating she was returning a call from [facility]. Pt is ready to schedule colonoscopy at hospital.  Please advise thank you.

## 2024-05-09 NOTE — Addendum Note (Signed)
 Addended by: CLAUDENE NAOMIE SAILOR on: 05/09/2024 03:51 PM   Modules accepted: Orders

## 2024-05-09 NOTE — Telephone Encounter (Signed)
 Spoke to patient to offer hospital endoscopy/colonoscopy on 07/09/24 with Dr Leigh. Patient would like to schedule this appointment.  I have contacted hospital scheduling and rescheduled her procedures to 07/09/24 at 11 am, 930 am arrival. New case ID [id number].  Patient has been advised of time/date/location for upcoming procedure and has been given generalized verbal prep instructions. Discussed that a care partner 18 years or older should bring her, stay for the procedure and drive home due to sedation. Written instructions have been made available to the patient for additional review via mychart.

## 2024-05-11 ENCOUNTER — Ambulatory Visit: Admission: RE | Admit: 2024-05-11 | Discharge: 2024-05-11 | Disposition: A | Source: Ambulatory Visit

## 2024-05-11 DIAGNOSIS — Z1231 Encounter for screening mammogram for malignant neoplasm of breast: Secondary | ICD-10-CM

## 2024-07-09 ENCOUNTER — Ambulatory Visit (HOSPITAL_COMMUNITY): Admit: 2024-07-09 | Admitting: Gastroenterology

## 2024-07-09 ENCOUNTER — Encounter (HOSPITAL_COMMUNITY): Payer: Self-pay

## 2024-10-19 ENCOUNTER — Ambulatory Visit (HOSPITAL_BASED_OUTPATIENT_CLINIC_OR_DEPARTMENT_OTHER): Admitting: Obstetrics & Gynecology
